# Patient Record
Sex: Male | Born: 1965
Health system: Southern US, Community
[De-identification: ages and names within clinical notes are randomized; demographics above are authoritative.]

## PROBLEM LIST (undated history)

## (undated) DIAGNOSIS — K859 Acute pancreatitis without necrosis or infection, unspecified: Secondary | ICD-10-CM

## (undated) DIAGNOSIS — E785 Hyperlipidemia, unspecified: Secondary | ICD-10-CM

## (undated) DIAGNOSIS — I10 Essential (primary) hypertension: Secondary | ICD-10-CM

## (undated) DIAGNOSIS — E119 Type 2 diabetes mellitus without complications: Secondary | ICD-10-CM

## (undated) HISTORY — PX: NO PAST SURGERIES: SHX2092

## (undated) HISTORY — PX: HERNIA REPAIR: SHX51

## (undated) HISTORY — DX: Hyperlipidemia, unspecified: E78.5

---

## 2014-05-10 ENCOUNTER — Emergency Department
Admit: 2014-05-10 | Disposition: A | Discharge status: Home / Self Care | Payer: Self-pay | Admitting: Emergency Medicine

## 2014-05-10 LAB — BODY FLUID CELL COUNT WITH DIFFERENTIAL
Basophil: 0 %
Eosinophil: 0 %
Lymphocytes: 1 %
Neutrophils: 81 %
Nucleated Cell Count: 11295 /mm3
Other Cells BF: 0 %
Other Mononuclear Cells: 18 %

## 2014-05-10 LAB — SYNOVIAL FLUID, CRYSTAL

## 2014-05-10 LAB — BASIC METABOLIC PANEL
Anion Gap: 11 (ref 7–16)
BUN: 13 mg/dL
Calcium, Total: 10.3 mg/dL
Chloride: 96 mmol/L — ABNORMAL LOW
Co2: 29 mmol/L
Creatinine: 0.76 mg/dL
EGFR (African American): >60
EGFR (Non-African Amer.): >60
Glucose: 145 mg/dL — ABNORMAL HIGH
Potassium: 3.8 mmol/L
Sodium: 136 mmol/L

## 2014-05-10 LAB — CBC WITH DIFFERENTIAL/PLATELET
Basophil #: 0.1 10*3/uL (ref 0.0–0.1)
Basophil %: 0.8 %
Eosinophil #: 0.3 10*3/uL (ref 0.0–0.7)
Eosinophil %: 1.5 %
HCT: 48.4 % (ref 40.0–52.0)
HGB: 16.3 g/dL (ref 13.0–18.0)
Lymphocyte #: 2.3 10*3/uL (ref 1.0–3.6)
Lymphocyte %: 12.7 %
MCH: 34 pg (ref 26.0–34.0)
MCHC: 33.6 g/dL (ref 32.0–36.0)
MCV: 101 fL — ABNORMAL HIGH (ref 80–100)
Monocyte #: 1.2 x10 3/mm — ABNORMAL HIGH (ref 0.2–1.0)
Monocyte %: 6.4 %
Neutrophil #: 14.4 10*3/uL — ABNORMAL HIGH (ref 1.4–6.5)
Neutrophil %: 78.6 %
Platelet: 284 10*3/uL (ref 150–440)
RBC: 4.78 10*6/uL (ref 4.40–5.90)
RDW: 12.9 % (ref 11.5–14.5)
WBC: 18.4 10*3/uL — ABNORMAL HIGH (ref 3.8–10.6)

## 2014-05-10 LAB — URIC ACID: Uric Acid: 10.2 mg/dL — ABNORMAL HIGH

## 2014-05-10 LAB — SEDIMENTATION RATE: Erythrocyte Sed Rate: 10 mm/hr (ref 0–15)

## 2014-05-14 LAB — BODY FLUID CULTURE

## 2015-07-26 ENCOUNTER — Emergency Department
Admission: EM | Admit: 2015-07-26 | Discharge: 2015-07-26 | Disposition: A | Discharge status: Home / Self Care | Payer: Self-pay | Attending: Emergency Medicine | Admitting: Emergency Medicine

## 2015-07-26 ENCOUNTER — Encounter: Payer: Self-pay | Admitting: Emergency Medicine

## 2015-07-26 DIAGNOSIS — E119 Type 2 diabetes mellitus without complications: Secondary | ICD-10-CM | POA: Insufficient documentation

## 2015-07-26 DIAGNOSIS — F17210 Nicotine dependence, cigarettes, uncomplicated: Secondary | ICD-10-CM | POA: Insufficient documentation

## 2015-07-26 DIAGNOSIS — H9201 Otalgia, right ear: Secondary | ICD-10-CM | POA: Insufficient documentation

## 2015-07-26 DIAGNOSIS — Z79899 Other long term (current) drug therapy: Secondary | ICD-10-CM | POA: Insufficient documentation

## 2015-07-26 DIAGNOSIS — Z7982 Long term (current) use of aspirin: Secondary | ICD-10-CM | POA: Insufficient documentation

## 2015-07-26 DIAGNOSIS — Z7984 Long term (current) use of oral hypoglycemic drugs: Secondary | ICD-10-CM | POA: Insufficient documentation

## 2015-07-26 DIAGNOSIS — R002 Palpitations: Secondary | ICD-10-CM | POA: Insufficient documentation

## 2015-07-26 DIAGNOSIS — R112 Nausea with vomiting, unspecified: Secondary | ICD-10-CM | POA: Insufficient documentation

## 2015-07-26 HISTORY — DX: Type 2 diabetes mellitus without complications: E11.9

## 2015-07-26 LAB — URINALYSIS COMPLETE WITH MICROSCOPIC (ARMC ONLY)
Bacteria, UA: NONE SEEN
Bilirubin Urine: NEGATIVE
Glucose, UA: NEGATIVE mg/dL
Hgb urine dipstick: NEGATIVE
Ketones, ur: NEGATIVE mg/dL
Leukocytes, UA: NEGATIVE
Nitrite: NEGATIVE
Protein, ur: NEGATIVE mg/dL
Specific Gravity, Urine: 1.019 (ref 1.005–1.030)
pH: 6 (ref 5.0–8.0)

## 2015-07-26 LAB — COMPREHENSIVE METABOLIC PANEL
ALT: 28 U/L (ref 17–63)
AST: 31 U/L (ref 15–41)
Albumin: 4.2 g/dL (ref 3.5–5.0)
Alkaline Phosphatase: 73 U/L (ref 38–126)
Anion gap: 8 (ref 5–15)
BUN: 14 mg/dL (ref 6–20)
CO2: 25 mmol/L (ref 22–32)
Calcium: 9.5 mg/dL (ref 8.9–10.3)
Chloride: 105 mmol/L (ref 101–111)
Creatinine, Ser: 0.82 mg/dL (ref 0.61–1.24)
GFR calc Af Amer: >60 mL/min (ref >60)
GFR calc non Af Amer: >60 mL/min (ref >60)
Glucose, Bld: 133 mg/dL — ABNORMAL HIGH (ref 65–99)
Potassium: 4.4 mmol/L (ref 3.5–5.1)
Sodium: 138 mmol/L (ref 135–145)
Total Bilirubin: 0.7 mg/dL (ref 0.3–1.2)
Total Protein: 7.2 g/dL (ref 6.5–8.1)

## 2015-07-26 LAB — BASIC METABOLIC PANEL
Anion gap: UNDETERMINED (ref 5–15)
BUN: 13 mg/dL (ref 6–20)
CO2: 25 mmol/L (ref 22–32)
Calcium: 9.6 mg/dL (ref 8.9–10.3)
Chloride: 104 mmol/L (ref 101–111)
Creatinine, Ser: 0.89 mg/dL (ref 0.61–1.24)
GFR calc Af Amer: >60 mL/min (ref >60)
GFR calc non Af Amer: >60 mL/min (ref >60)
Glucose, Bld: 159 mg/dL — ABNORMAL HIGH (ref 65–99)
Potassium: 4.3 mmol/L (ref 3.5–5.1)
Sodium: UNDETERMINED mmol/L (ref 135–145)

## 2015-07-26 LAB — CBC
HCT: 45.8 % (ref 40.0–52.0)
Hemoglobin: 15.8 g/dL (ref 13.0–18.0)
MCH: 34.6 pg — ABNORMAL HIGH (ref 26.0–34.0)
MCHC: 34.4 g/dL (ref 32.0–36.0)
MCV: 100.5 fL — ABNORMAL HIGH (ref 80.0–100.0)
Platelets: 207 10*3/uL (ref 150–440)
RBC: 4.55 MIL/uL (ref 4.40–5.90)
RDW: 13.7 % (ref 11.5–14.5)
WBC: 12.7 10*3/uL — ABNORMAL HIGH (ref 3.8–10.6)

## 2015-07-26 LAB — TROPONIN I: Troponin I: <0.03 ng/mL (ref <0.031)

## 2015-07-26 MED ORDER — CIPROFLOXACIN-DEXAMETHASONE 0.3-0.1 % OT SUSP: 4.0000 [drp] | Freq: Two times a day (BID) | OTIC | Status: DC

## 2015-07-26 MED ORDER — LIDOCAINE-EPINEPHRINE 2 %-1:100000 IJ SOLN
1.7000 mL | Freq: Once | INTRAMUSCULAR | Status: AC
  Administered 2015-07-26: 1.7 mL

## 2015-07-26 MED ORDER — SODIUM CHLORIDE 0.9 % IV BOLUS (SEPSIS)
1000.0000 mL | Freq: Once | INTRAVENOUS | Status: AC
  Administered 2015-07-26: 1000 mL via INTRAVENOUS

## 2015-07-26 MED ORDER — LIDOCAINE-EPINEPHRINE 2 %-1:100000 IJ SOLN
20.0000 mL | Freq: Once | INTRAMUSCULAR | Status: DC
  Filled 2015-07-26: qty 20

## 2015-07-26 MED ORDER — ONDANSETRON HCL 4 MG/2ML IJ SOLN
4.0000 mg | Freq: Once | INTRAMUSCULAR | Status: AC
  Administered 2015-07-26: 4 mg via INTRAVENOUS
  Filled 2015-07-26: qty 2

## 2015-07-26 MED ORDER — LIDOCAINE-EPINEPHRINE 2 %-1:100000 IJ SOLN
INTRAMUSCULAR | Status: AC
  Administered 2015-07-26: 1.7 mL
  Filled 2015-07-26: qty 1.7

## 2015-07-26 NOTE — ED Provider Notes (Signed)
CSN: [id number]     Arrival date & time 07/26/15  [id number] History   First MD Initiated Contact with Patient 07/26/15 [phone]     Chief Complaint  Patient presents with  . Foreign Body in [place]  . Dizziness  . Emesis  . Shortness of Breath     (Consider location/radiation/quality/duration/timing/severity/associated sxs/prior Treatment) The history is provided by the patient.  Riley Wiggins is a 50 y.o. male hx of DM, here with palpitations, vomiting, possible foreign body in the right ear. States that he felt a bug crawling in his right year for the last 3 days. He says he feels very anxious about it has some palpitations associated with it. Also felt like he was on pass out and this morning he vomited once but has been nauseated for several days. Denies any actual chest pain. Has some subjective shortness of breath that now is resolved. Denies recent travel was a history DVT or PE. Denies any hx of CAD.    Past Medical History  Diagnosis Date  . Diabetes mellitus without complication ([place])    History reviewed. No pertinent past surgical history. History reviewed. No pertinent family history. Social History  Substance Use Topics  . Smoking status: Current Every Day Smoker    Types: Cigarettes  . Smokeless tobacco: Never Used  . Alcohol Use: Yes    Review of Systems  HENT:       R ear pain   Gastrointestinal: Positive for vomiting.  Neurological: Positive for dizziness.  All other systems reviewed and are negative.     Allergies  Review of patient's allergies indicates no known allergies.  Home Medications   Prior to Admission medications   Not on File   BP 166/91 mmHg  Pulse 95  Temp(Src) 98.1 F (36.7 C) (Oral)  Resp 18  Ht 6\' 1"  (1.854 m)  Wt 293 lb (132.904 kg)  BMI 38.67 kg/m2  SpO2 96% Physical Exam  Constitutional: He is oriented to person, place, and time. He appears well-developed and well-nourished.  Anxious   HENT:  Head: Normocephalic.  Mouth/Throat:  Oropharynx is clear and moist.  No obvious foreign body in the ear canals. TM nl and no obvious otitis media or externa   Eyes: Conjunctivae are normal. Pupils are equal, round, and reactive to light.  Neck: Normal range of motion. Neck supple.  Cardiovascular: Normal rate, regular rhythm and normal heart sounds.   Pulmonary/Chest: Effort normal and breath sounds normal. No respiratory distress. He has no wheezes.  Abdominal: Soft. Bowel sounds are normal. He exhibits no distension. There is no tenderness. There is no rebound.  Musculoskeletal: Normal range of motion. He exhibits no edema or tenderness.  Neurological: He is alert and oriented to person, place, and time. No cranial nerve deficit. Coordination normal.  Skin: Skin is warm and dry.  Psychiatric: He has a normal mood and affect. His behavior is normal. Judgment and thought content normal.  Nursing note and vitals reviewed.   ED Course  Procedures (including critical care time) Labs Review Labs Reviewed  BASIC METABOLIC PANEL - Abnormal; Notable for the following:    Glucose, Bld 159 (*)    All other components within normal limits  CBC - Abnormal; Notable for the following:    WBC 12.7 (*)    MCV 100.5 (*)    MCH 34.6 (*)    All other components within normal limits  URINALYSIS COMPLETEWITH MICROSCOPIC ([facility] ONLY)  COMPREHENSIVE METABOLIC PANEL  CBG MONITORING, ED  Imaging Review No results found. I have personally reviewed and evaluated these images and lab results as part of my medical decision-making.   EKG Interpretation None       ED ECG REPORT I, Shakela Donati, the attending physician, personally viewed and interpreted this ECG.   Date: 07/26/2015  EKG Time: 6:16 am  Rate: 86  Rhythm: normal EKG, normal sinus rhythm, nonspecific ST and T waves changes  Axis: normal  Intervals:none  ST&T Change: nonspecific    MDM   Final diagnoses:  None   Riley Wiggins is a 50 y.o. male here with R ear  possible foreign body sensation and palpitations. I don't see any obvious foreign body or bugs or otitis media or externa on exam. Likely anxious about his symptoms. I doubt ACS or PE. Will get labs and hydrate and put lido in R ear for comfort.   9:18 AM Labs unremarkable. Chemistry unremarkable. Felt better. Tolerated PO. Re examine the right ear after lidocaine. I still don't see any foreign body or any wax. He still feels something there. I would prescribe auralgan normally but its off the market. Can try ciprodex for comfort and may help with some inflammation    Wandra Arthurs, MD 07/26/15 [phone]

## 2015-07-26 NOTE — Discharge Instructions (Signed)
Stay hydrated.   Take ciprodex drops to right ear twice daily for comfort.   See your doctor   Return to ER if you have severe ear pain, chest pain, palpitations, shortness of breath

## 2015-07-26 NOTE — ED Notes (Signed)
Pt unable to give urine sample at this time; pt has specimen cup. 

## 2015-07-26 NOTE — ED Notes (Addendum)
Pt presents to ED with possible bug in his ear. Pt states he works outside and on Saturday felt like a bug was crawling inside his ear. Pt reports this morning around 4am he started vomiting and felt like he was "going to pass out". Pt states he fells like he is short of breath and like his heart is racing. Pt states when he work up and felt like he was in a cold sweat. Pt alert and calm at this time with no increased work of breathing or acute distress noted. Skin warm and dry.

## 2019-08-03 ENCOUNTER — Encounter (HOSPITAL_COMMUNITY): Payer: Self-pay | Admitting: Emergency Medicine

## 2019-08-03 ENCOUNTER — Emergency Department (HOSPITAL_COMMUNITY): Payer: PRIVATE HEALTH INSURANCE

## 2019-08-03 ENCOUNTER — Inpatient Hospital Stay (HOSPITAL_COMMUNITY)
Admission: EM | Admit: 2019-08-03 | Discharge: 2019-08-06 | DRG: 438 | Disposition: A | Discharge status: Home / Self Care | Payer: PRIVATE HEALTH INSURANCE | Attending: Family Medicine | Admitting: Family Medicine

## 2019-08-03 ENCOUNTER — Other Ambulatory Visit: Payer: Self-pay

## 2019-08-03 ENCOUNTER — Inpatient Hospital Stay (HOSPITAL_COMMUNITY): Payer: PRIVATE HEALTH INSURANCE

## 2019-08-03 DIAGNOSIS — Z20822 Contact with and (suspected) exposure to covid-19: Secondary | ICD-10-CM | POA: Diagnosis present

## 2019-08-03 DIAGNOSIS — E8729 Other acidosis: Secondary | ICD-10-CM

## 2019-08-03 DIAGNOSIS — Z833 Family history of diabetes mellitus: Secondary | ICD-10-CM

## 2019-08-03 DIAGNOSIS — I700 Atherosclerosis of aorta: Secondary | ICD-10-CM | POA: Diagnosis present

## 2019-08-03 DIAGNOSIS — I152 Hypertension secondary to endocrine disorders: Secondary | ICD-10-CM | POA: Diagnosis present

## 2019-08-03 DIAGNOSIS — E785 Hyperlipidemia, unspecified: Secondary | ICD-10-CM

## 2019-08-03 DIAGNOSIS — K859 Acute pancreatitis without necrosis or infection, unspecified: Principal | ICD-10-CM | POA: Diagnosis present

## 2019-08-03 DIAGNOSIS — K831 Obstruction of bile duct: Secondary | ICD-10-CM

## 2019-08-03 DIAGNOSIS — Z9119 Patient's noncompliance with other medical treatment and regimen: Secondary | ICD-10-CM

## 2019-08-03 DIAGNOSIS — E1169 Type 2 diabetes mellitus with other specified complication: Secondary | ICD-10-CM | POA: Diagnosis present

## 2019-08-03 DIAGNOSIS — E1159 Type 2 diabetes mellitus with other circulatory complications: Secondary | ICD-10-CM | POA: Diagnosis present

## 2019-08-03 DIAGNOSIS — M109 Gout, unspecified: Secondary | ICD-10-CM | POA: Diagnosis present

## 2019-08-03 DIAGNOSIS — I10 Essential (primary) hypertension: Secondary | ICD-10-CM | POA: Diagnosis present

## 2019-08-03 DIAGNOSIS — Z79899 Other long term (current) drug therapy: Secondary | ICD-10-CM

## 2019-08-03 DIAGNOSIS — Z7982 Long term (current) use of aspirin: Secondary | ICD-10-CM

## 2019-08-03 DIAGNOSIS — Z7984 Long term (current) use of oral hypoglycemic drugs: Secondary | ICD-10-CM

## 2019-08-03 DIAGNOSIS — F17210 Nicotine dependence, cigarettes, uncomplicated: Secondary | ICD-10-CM | POA: Diagnosis present

## 2019-08-03 DIAGNOSIS — R6510 Systemic inflammatory response syndrome (SIRS) of non-infectious origin without acute organ dysfunction: Secondary | ICD-10-CM | POA: Diagnosis present

## 2019-08-03 DIAGNOSIS — E1110 Type 2 diabetes mellitus with ketoacidosis without coma: Secondary | ICD-10-CM | POA: Diagnosis present

## 2019-08-03 DIAGNOSIS — E781 Pure hyperglyceridemia: Secondary | ICD-10-CM | POA: Diagnosis present

## 2019-08-03 DIAGNOSIS — K8500 Idiopathic acute pancreatitis without necrosis or infection: Secondary | ICD-10-CM | POA: Diagnosis present

## 2019-08-03 DIAGNOSIS — E872 Acidosis: Secondary | ICD-10-CM

## 2019-08-03 HISTORY — DX: Hyperlipidemia, unspecified: E78.5

## 2019-08-03 HISTORY — DX: Essential (primary) hypertension: I10

## 2019-08-03 LAB — I-STAT VENOUS BLOOD GAS, ED
Acid-base deficit: 1 mmol/L (ref 0.0–2.0)
Bicarbonate: 23.5 mmol/L (ref 20.0–28.0)
Calcium, Ion: 1.03 mmol/L — ABNORMAL LOW (ref 1.15–1.40)
HCT: 51 % (ref 39.0–52.0)
Hemoglobin: 17.3 g/dL — ABNORMAL HIGH (ref 13.0–17.0)
O2 Saturation: 100 %
Potassium: 4.6 mmol/L (ref 3.5–5.1)
Sodium: 131 mmol/L — ABNORMAL LOW (ref 135–145)
TCO2: 25 mmol/L (ref 22–32)
pCO2, Ven: 37.5 mmHg — ABNORMAL LOW (ref 44.0–60.0)
pH, Ven: 7.406 (ref 7.250–7.430)
pO2, Ven: 189 mmHg — ABNORMAL HIGH (ref 32.0–45.0)

## 2019-08-03 LAB — CBC
HCT: 46.2 % (ref 39.0–52.0)
Hemoglobin: 15.8 g/dL (ref 13.0–17.0)
MCH: 34 pg (ref 26.0–34.0)
MCHC: 34.2 g/dL (ref 30.0–36.0)
MCV: 99.4 fL (ref 80.0–100.0)
Platelets: 186 10*3/uL (ref 150–400)
RBC: 4.65 MIL/uL (ref 4.22–5.81)
RDW: 12.9 % (ref 11.5–15.5)
WBC: 16.8 10*3/uL — ABNORMAL HIGH (ref 4.0–10.5)
nRBC: 0 % (ref 0.0–0.2)

## 2019-08-03 LAB — LIPASE, BLOOD: Lipase: 2307 U/L — ABNORMAL HIGH (ref 11–51)

## 2019-08-03 LAB — COMPREHENSIVE METABOLIC PANEL
ALT: 106 U/L — ABNORMAL HIGH (ref 0–44)
AST: 59 U/L — ABNORMAL HIGH (ref 15–41)
Albumin: 4.3 g/dL (ref 3.5–5.0)
Alkaline Phosphatase: 87 U/L (ref 38–126)
Anion gap: 18 — ABNORMAL HIGH (ref 5–15)
BUN: 12 mg/dL (ref 6–20)
CO2: 18 mmol/L — ABNORMAL LOW (ref 22–32)
Calcium: 9.7 mg/dL (ref 8.9–10.3)
Chloride: 97 mmol/L — ABNORMAL LOW (ref 98–111)
Creatinine, Ser: 1.16 mg/dL (ref 0.61–1.24)
GFR calc Af Amer: >60 mL/min (ref >60)
GFR calc non Af Amer: >60 mL/min (ref >60)
Glucose, Bld: 362 mg/dL — ABNORMAL HIGH (ref 70–99)
Potassium: 4.5 mmol/L (ref 3.5–5.1)
Sodium: 133 mmol/L — ABNORMAL LOW (ref 135–145)
Total Bilirubin: 2.1 mg/dL — ABNORMAL HIGH (ref 0.3–1.2)
Total Protein: 7.1 g/dL (ref 6.5–8.1)

## 2019-08-03 LAB — LIPID PANEL
Cholesterol: 189 mg/dL (ref 0–200)
HDL: 43 mg/dL (ref >40)
LDL Cholesterol: UNDETERMINED mg/dL (ref 0–99)
Total CHOL/HDL Ratio: 4.4 RATIO
Triglycerides: 554 mg/dL — ABNORMAL HIGH (ref <150)
VLDL: UNDETERMINED mg/dL (ref 0–40)

## 2019-08-03 LAB — URINALYSIS, ROUTINE W REFLEX MICROSCOPIC
Bacteria, UA: NONE SEEN
Bilirubin Urine: NEGATIVE
Glucose, UA: >=500 mg/dL — AB
Ketones, ur: 20 mg/dL — AB
Leukocytes,Ua: NEGATIVE
Nitrite: NEGATIVE
Protein, ur: 30 mg/dL — AB
Specific Gravity, Urine: >1.046 — ABNORMAL HIGH (ref 1.005–1.030)
pH: 5 (ref 5.0–8.0)

## 2019-08-03 LAB — BETA-HYDROXYBUTYRIC ACID: Beta-Hydroxybutyric Acid: 1.16 mmol/L — ABNORMAL HIGH (ref 0.05–0.27)

## 2019-08-03 LAB — ETHANOL: Alcohol, Ethyl (B): <10 mg/dL (ref <10)

## 2019-08-03 LAB — LACTIC ACID, PLASMA: Lactic Acid, Venous: 3.3 mmol/L (ref 0.5–1.9)

## 2019-08-03 LAB — ACETAMINOPHEN LEVEL: Acetaminophen (Tylenol), Serum: <10 ug/mL — ABNORMAL LOW (ref 10–30)

## 2019-08-03 LAB — LDL CHOLESTEROL, DIRECT: Direct LDL: 57 mg/dL (ref 0–99)

## 2019-08-03 LAB — SARS CORONAVIRUS 2 BY RT PCR (HOSPITAL ORDER, PERFORMED IN ~~LOC~~ HOSPITAL LAB): SARS Coronavirus 2: NEGATIVE

## 2019-08-03 MED ORDER — IOHEXOL 300 MG/ML  SOLN
100.0000 mL | Freq: Once | INTRAMUSCULAR | Status: AC | PRN
  Administered 2019-08-03: 100 mL via INTRAVENOUS

## 2019-08-03 MED ORDER — INSULIN REGULAR(HUMAN) IN NACL 100-0.9 UT/100ML-% IV SOLN
INTRAVENOUS | Status: DC
  Administered 2019-08-04: 4.6 [IU]/h via INTRAVENOUS
  Administered 2019-08-04: 18 [IU]/h via INTRAVENOUS
  Filled 2019-08-03 (×2): qty 100

## 2019-08-03 MED ORDER — ENOXAPARIN SODIUM 80 MG/0.8ML ~~LOC~~ SOLN
65.0000 mg | SUBCUTANEOUS | Status: DC
  Filled 2019-08-03: qty 0.65

## 2019-08-03 MED ORDER — POTASSIUM CHLORIDE 10 MEQ/100ML IV SOLN
10.0000 meq | INTRAVENOUS | Status: AC
  Administered 2019-08-03 (×2): 10 meq via INTRAVENOUS
  Filled 2019-08-03 (×2): qty 100

## 2019-08-03 MED ORDER — MORPHINE SULFATE (PF) 4 MG/ML IV SOLN: 6.0000 mg | Freq: Once | INTRAVENOUS | Status: DC

## 2019-08-03 MED ORDER — SODIUM CHLORIDE 0.9 % IV SOLN: INTRAVENOUS | Status: DC

## 2019-08-03 MED ORDER — SODIUM CHLORIDE 0.9 % IV BOLUS
500.0000 mL | Freq: Once | INTRAVENOUS | Status: AC
  Administered 2019-08-03: 500 mL via INTRAVENOUS

## 2019-08-03 MED ORDER — SODIUM CHLORIDE 0.9 % IV SOLN: 2.0000 g | Freq: Once | INTRAVENOUS | Status: DC

## 2019-08-03 MED ORDER — POTASSIUM CHLORIDE 10 MEQ/100ML IV SOLN: 10.0000 meq | INTRAVENOUS | Status: DC

## 2019-08-03 MED ORDER — DEXTROSE 50 % IV SOLN
0.0000 mL | INTRAVENOUS | Status: DC | PRN
Start: 2019-08-03 — End: 2019-08-03

## 2019-08-03 MED ORDER — INSULIN REGULAR(HUMAN) IN NACL 100-0.9 UT/100ML-% IV SOLN: INTRAVENOUS | Status: DC

## 2019-08-03 MED ORDER — SODIUM CHLORIDE 0.9% FLUSH
3.0000 mL | Freq: Once | INTRAVENOUS | Status: AC
  Administered 2019-08-03: 3 mL via INTRAVENOUS

## 2019-08-03 MED ORDER — MORPHINE SULFATE (PF) 2 MG/ML IV SOLN
2.0000 mg | INTRAVENOUS | Status: DC | PRN
  Administered 2019-08-04 – 2019-08-06 (×7): 2 mg via INTRAVENOUS
  Filled 2019-08-03 (×7): qty 1

## 2019-08-03 MED ORDER — DEXTROSE-NACL 5-0.45 % IV SOLN: INTRAVENOUS | Status: DC

## 2019-08-03 MED ORDER — METRONIDAZOLE IN NACL 5-0.79 MG/ML-% IV SOLN: 500.0000 mg | Freq: Once | INTRAVENOUS | Status: DC

## 2019-08-03 MED ORDER — MORPHINE SULFATE (PF) 4 MG/ML IV SOLN
4.0000 mg | Freq: Once | INTRAVENOUS | Status: AC
  Administered 2019-08-03: 4 mg via INTRAVENOUS
  Filled 2019-08-03: qty 1

## 2019-08-03 MED ORDER — DEXTROSE 50 % IV SOLN: 0.0000 mL | INTRAVENOUS | Status: DC | PRN

## 2019-08-03 MED ORDER — ONDANSETRON HCL 4 MG/2ML IJ SOLN
4.0000 mg | Freq: Once | INTRAMUSCULAR | Status: AC
  Administered 2019-08-03: 4 mg via INTRAVENOUS
  Filled 2019-08-03: qty 2

## 2019-08-03 NOTE — H&P (Addendum)
[facility] Admission History and Physical Service Pager: [phone]  Patient name: Riley Wiggins Medical record number: [id number] Date of birth: July 16, 1965 Age: 54 y.o. Gender: male  Primary Care Provider: Riki Rusk, PA Consultants: None Code Status: Full  Preferred Emergency Contact: Christianne Dolin, [place], [phone]  Chief Complaint: Abdominal pain  Assessment and Plan: Riley Wiggins is a 54 y.o. male presenting with abdominal pain found to have acute pancreatitis. PMH is significant for gout, hyperlipidemia, type 2 diabetes, hypertension  Abdominal Pain likely Secondary to Acute Pancreatitis Patient presented with 2 days of constant abdominal pain located in the central abdomen/epigastric region which has worsened over the past 2 days and does not radiate anywhere.  Patient denies this being related to activity or movement.  Initial lipase in the emergency department elevated at 2307.  Patient also noted to have a mildly elevated AST of 59, ALT of 106.  Patient with bilirubin mildly elevated at 2.1. Patient was also noted to have an elevated white blood cell count of 16.8.  CT scan performed in the emergency department shows acute pancreatitis, moderate in severity with heterogenous decreased enhancement within the pancreatic head compatible with inflammatory changes without signs of abscess or pseudocyst.  CT abdomen also showed diffuse hepatic steatosis and small volume ascites. Cholesterol panel collected in the emergency department shows cholesterol 189, HDL 43, triglycerides of 554 and LDL unable to calculate due to elevated triglycerides.  In the emergency department ordered, but did not receive ceftriaxone and Flagyl. Overall etiology for abdominal pain in the setting of acute pancreatitis can include alcohol, gallstone pancreatitis, hypertriglyceridemia, less likely due to recent procedures no documented history of ERCP. 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  No gallstones seen on CT, however right upper quadrant ultrasound was performed as patient did have mildly elevated AST/ALT and mildly elevated bilirubin of 2.1 which could be suggestive of a gallstone.  Patient does have a history of hypertriglyceridemia with previous lipid panel showing triglycerides as high as the 800s in 2015 with a more recent one in 2018 showing triglycerides in the 400s, TGs today 554, therefore less likely to cause acute pancreatitis as is <1000.  Patient also is a former tobacco smoker and quit 2 or 3 years ago.  Low suspicion for ACS and EKG with normal sinus rhythm though this could still occur in the setting of acute pancreatitis so we will check a troponin.  Would not continue antibiotics at this time given no fever, no evidence of extrapancreatic infection, and no evidence of necrotizing pancreatitis on CT abd/pelvis.  VSS and no evidence of organ dysfunction.   S/P 500cc NS bolus in ED, will treat as NPO with fluids per DKA protocol.  QTc is mildly prolonged at 462, therefore would be conservative with antiemetic and will order one time dosing if requests.  -Admit to progressive, attending Dr. Owens Shark -A.m. CMP -We will do right upper quadrant ultrasound to look for signs of gallstones -We will check a troponin but low suspicion for ACS -We will discontinue Flagyl and ceftriaxone -Morphine 4 mg in the ED for pain -Continuous cardiac monitoring since using opioids. -We will trend lactate level - limit antiemetic use with mildly prolonged QTc - continuous pulse ox while receiving opioids as is naive  DKA Patient's initial blood glucose elevated at 362 in the emergency department with a bicarb decreased at 18,  anion gap increased at 18, beta hydroxybutyrate was drawn which was elevated at 1.16.  Most recent potassium 4.5.  VBG significant for normal pH 7.4, PCO2 37.5, bicarb  calculated 23.5.  Urine with glucose >500, 20 ketones.  Would meet criteria for mild DKA.  Patient does endorse he took his Metformin as usual this morning, he states he normally takes 1000 mg in the morning. -Insulin drip -IVF normal saline at 75 mL/h -Every 4hr BMP -Endo tool protocol - monitor K while on insulin gtt -Urinalysis -N.p.o. until transition -Vitals per floor -DM educator -A1C  Patient meeting SIRS criteria without a source In the emergency department patient's lactate level was elevated at 3.3.  Patient had an elevated white blood cell count of 16.8.  Patient did have a respiratory rate at 1 time in the low 30s per chart review and was tachycardic during 1 vitals check.  Patient was started on Flagyl and ceftriaxone in the emergency department patient meets SIRS criteria but without a known source.  Patient's elevated lactate, WBC level most likely reactive in the setting of his acute pancreatitis and DKA.  No fevers also supports this. -Discontinue metronidazole and ceftriaxone -We will monitor for fevers -If patient develops symptoms concerning for infectious process will initiate antibiotic coverage as appropriate.  Hyperlipidemia with hypertriglyceridemia Home medications include atorvastatin 40 mg/day.  Most recent lipid profile from 2018 shows triglycerides elevated at 410, HDL 43, unable to calculate LDL.  Chart review shows patient has had triglycerides as high as 839 in 2015.  Current lipid panel drawn in the emergency department shows triglycerides of 554, HDL 43, unable to calculate LDL due to elevated triglycerides. -We will hold statin pending n.p.o. status and mildly elevated LFTs.  Hypertension Home medication include lisinopril 20 mg/day.  Blood pressures in the emergency department ranged from 122-140/68-97 with most recent blood pressure 140/75. -We will hold home lisinopril since patient received CT earlier -Can consider restarting once no longer  n.p.o.  Type 2 diabetes Home medications include Metformin 500 mg twice daily, but takes it 1000mg  QD.  Blood glucose level on admission 362. -We will hold Metformin during the hospital setting -Treating for mild DKA as above  History of tobacco abuse Patient with a previous smoking history of 1/2 pack/day for 20 years.  Endorses quitting 2 or 3 years ago.   FEN/GI: N.p.o. Prophylaxis: Lovenox  Disposition: Admitted to progressive  History of Present Illness:  Riley Wiggins is a 54 y.o. male presenting with abdominal pain found to have acute pancreatitis patient.  Patient states he is an occasional weekend drinker.  Initial lipase elevated at 2307.  CT scan shows pancreatitis without obvious pseudocyst or abscess with gallbladder appearing normal.  Patient did have an elevated glucose of 362 with a CO2 elevated at 18 and anion gap at 18.  Beta hydroxybutyrate was elevated as well.  His abdominal pain started yesterday, all of a sudden.  He states that he stomach was getting more and more sore, continued to worsen.  Pain is in the center of the upper abdomen and does not radiate.  Today, he drove his truck as usual for work, but when he came home, he told his wife that his pain was so severe that he wanted to go to the hospital.  He has never had anything like this before.  Endorses nausea, no vomiting.  States that he has not eaten in 2 days because of the pain.  Every now and then on the weekend,  he states that he has one beer.    Patient reports that he took all of his medications in the morning and took them this morning.  His sugars are usually well-controlled by his report.  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  Takes an aspirin for primary prevention.  He drinks 3-4 12 oz beers on the weekend.  He follows with [facility].    Review Of Systems: Per HPI with the following additions:   Review of Systems  Constitutional: Negative for chills and  fever.  HENT: Negative for congestion, rhinorrhea and sore throat.   Respiratory: Negative for shortness of breath.   Cardiovascular: Negative for chest pain and leg swelling.  Gastrointestinal: Positive for abdominal pain and nausea. Negative for blood in stool, constipation, diarrhea and vomiting.  Genitourinary: Negative for dysuria.  Neurological: Negative for headaches.     There are no problems to display for this patient.   Past Medical History: Past Medical History:  Diagnosis Date  . Diabetes mellitus without complication [facility])     Past Surgical History: History reviewed. No pertinent surgical history.  Social History: Social History   Tobacco Use  . Smoking status: Current Every Day Smoker    Types: Cigarettes  . Smokeless tobacco: Never Used  Substance Use Topics  . Alcohol use: Yes  . Drug use: No   Additional social history: Smoked 1/2 ppd, quit 5 years ago, smoked 20 yrs, no vaping or other products, drinks 3-4 12 oz beers on the , denies illicit drug use Please also refer to relevant sections of EMR.  Family History: No family history on file. Mother and Father Diabetes Sister, father, and Mother High Cholesterol No family history of pancreatitis Mother, Father, 2 sisters with heart disease  Allergies and Medications: No Known Allergies No current facility-administered medications on file prior to encounter.   Current Outpatient Medications on File Prior to Encounter  Medication Sig Dispense Refill  . allopurinol (ZYLOPRIM) 100 MG tablet Take 200 mg by mouth daily.    Marland Kitchen aspirin 81 MG chewable tablet Chew 81 mg by mouth daily.    Marland Kitchen atorvastatin (LIPITOR) 40 MG tablet Take 1 tablet by mouth at bedtime.    Marland Kitchen lisinopril (PRINIVIL,ZESTRIL) 20 MG tablet Take 20 mg by mouth daily.    . metFORMIN (GLUCOPHAGE) 500 MG tablet Take 500 mg by mouth 2 (two) times daily with a meal.    . ciprofloxacin-dexamethasone (CIPRODEX) otic suspension Place 4 drops into the  right ear 2 (two) times daily. (Patient not taking: Reported on 08/03/2019) 7.5 mL 0    Objective: BP 122/87   Pulse 97   Temp 97.8 F (36.6 C) (Oral)   Resp (!) 23   SpO2 98%  Exam: General: Alert and oriented, no apparent distress  Eyes: PERRL ENTM: No pharyengeal erythema, mouth appears dry Cardiovascular: RRR with no murmurs noted Respiratory: CTA bilaterally  Gastrointestinal: Bowel sounds present.  Abdominal pain present in the epigastric region to palpation without acute surgical findings.  Patient does seem to have some mild right upper quadrant discomfort on palpation but with negative Murphy sign Derm: No rashes noted, patient with small 1 cm diameter abrasion to the right lower shin without any signs of erythema or drainage Neuro: No focal deficits Psych: Behavior and speech appropriate to situation  Labs and Imaging: CBC BMET  Recent Labs  Lab 08/03/19 1518 08/03/19 1518 08/03/19 2037  WBC 16.8*  --   --  HGB 15.8   < > 17.3*  HCT 46.2   < > 51.0  PLT 186  --   --    < > = values in this interval not displayed.   Recent Labs  Lab 08/03/19 1518 08/03/19 1518 08/03/19 2037  NA 133*   < > 131*  K 4.5   < > 4.6  CL 97*  --   --   CO2 18*  --   --   BUN 12  --   --   CREATININE 1.16  --   --   GLUCOSE 362*  --   --   CALCIUM 9.7  --   --    < > = values in this interval not displayed.      Lurline Del, DO 08/03/2019, 9:35 PM PGY-1, [facility] pager: [phone], text pages welcome  FPTS Upper-Level Resident Addendum   I have independently interviewed and examined the patient. I have discussed the above with the original author and agree with their documentation. My edits for correction/addition/clarification are in green. Please see also any attending notes.   [place] Constable, D.O. PGY-2, [facility] 08/03/2019 10:57 PM  FPTS Service pager: [phone] (text pages welcome through [place])

## 2019-08-03 NOTE — ED Notes (Signed)
Pt transported to Ultrasound.  

## 2019-08-03 NOTE — ED Triage Notes (Signed)
Pt reports generalized abd pain with nausea, denies diarrhea or constipation.

## 2019-08-03 NOTE — ED Provider Notes (Signed)
Emergency Department Provider Note   I have reviewed the triage vital signs and the nursing notes.   HISTORY  Chief Complaint Abdominal Pain   HPI Riley Wiggins is a 54 y.o. male with past history of diabetes presents to the emergency department with acute onset abdominal pain with nausea and poor appetite.  Symptoms worsened significantly yesterday.  Patient tried to go to work today but ultimately had to leave early and come to the emergency department.  He has not had fevers, chills, body aches.  Denies diarrhea or constipation.  No blood in the emesis.  No prior history of pancreatitis.  Patient drinks occasionally on the weekends.  No recent drinking binges.  Denies any new medications.  The patient's wife at bedside states they recently moved to the area and so been doing work at the house but no obvious injury.  Symptoms not significantly worse with eating but patient has felt so bad he has not been attempting to eat very much.     Past Medical History:  Diagnosis Date  . Diabetes mellitus without complication [facility])     Patient Active Problem List   Diagnosis Date Noted  . Pancreatitis 08/03/2019    History reviewed. No pertinent surgical history.  Allergies Patient has no known allergies.  No family history on file.  Social History Social History   Tobacco Use  . Smoking status: Current Every Day Smoker    Types: Cigarettes  . Smokeless tobacco: Never Used  Substance Use Topics  . Alcohol use: Yes  . Drug use: No    Review of Systems  Constitutional: No fever/chills Eyes: No visual changes. ENT: No sore throat. Cardiovascular: Denies chest pain. Respiratory: Denies shortness of breath. Gastrointestinal: Positive epigastric and LUQ abdominal pain. Positive nausea and vomiting.  No diarrhea.  No constipation. Genitourinary: Negative for dysuria. Musculoskeletal: Negative for back pain. Skin: Negative for rash. Neurological: Negative for headaches,  focal weakness or numbness.  10-point ROS otherwise negative.  ____________________________________________   PHYSICAL EXAM:  VITAL SIGNS: ED Triage Vitals [08/03/19 1511]  Enc Vitals Group     BP 133/76     Pulse Rate 91     Resp 20     Temp 97.8 F (36.6 C)     Temp Source Oral     SpO2 97 %   Constitutional: Alert and oriented. Well appearing and in no acute distress. Eyes: Conjunctivae are normal.  Head: Atraumatic. Nose: No congestion/rhinnorhea. Mouth/Throat: Mucous membranes are moist.  Neck: No stridor.   Cardiovascular: Normal rate, regular rhythm. Good peripheral circulation. Grossly normal heart sounds.   Respiratory: Normal respiratory effort.  No retractions. Lungs CTAB. Gastrointestinal: Soft with focal epigastric and LUQ tenderness. No rebound or guarding. Only mild tenderness in the RUQ. Positive distention.  Musculoskeletal: No gross deformities of extremities. Neurologic:  Normal speech and language. Skin:  Skin is warm, dry and intact. No rash noted.  ____________________________________________   LABS (all labs ordered are listed, but only abnormal results are displayed)  Labs Reviewed  LIPASE, BLOOD - Abnormal; Notable for the following components:      Result Value   Lipase 2,307 (*)    All other components within normal limits  COMPREHENSIVE METABOLIC PANEL - Abnormal; Notable for the following components:   Sodium 133 (*)    Chloride 97 (*)    CO2 18 (*)    Glucose, Bld 362 (*)    AST 59 (*)    ALT 106 (*)  Total Bilirubin 2.1 (*)    Anion gap 18 (*)    All other components within normal limits  CBC - Abnormal; Notable for the following components:   WBC 16.8 (*)    All other components within normal limits  ACETAMINOPHEN LEVEL - Abnormal; Notable for the following components:   Acetaminophen (Tylenol), Serum <10 (*)    All other components within normal limits  BETA-HYDROXYBUTYRIC ACID - Abnormal; Notable for the following  components:   Beta-Hydroxybutyric Acid 1.16 (*)    All other components within normal limits  LACTIC ACID, PLASMA - Abnormal; Notable for the following components:   Lactic Acid, Venous 3.3 (*)    All other components within normal limits  I-STAT VENOUS BLOOD GAS, ED - Abnormal; Notable for the following components:   pCO2, Ven 37.5 (*)    pO2, Ven 189.0 (*)    Sodium 131 (*)    Calcium, Ion 1.03 (*)    Hemoglobin 17.3 (*)    All other components within normal limits  SARS CORONAVIRUS 2 BY RT PCR (HOSPITAL ORDER, [facility])  ETHANOL  URINALYSIS, ROUTINE W REFLEX MICROSCOPIC  LIPID PANEL  LACTIC ACID, PLASMA  CBG MONITORING, ED   ____________________________________________  RADIOLOGY  CT ABDOMEN PELVIS W CONTRAST  Result Date: 08/03/2019 CLINICAL DATA:  Left upper quadrant pain, pancreatitis, nausea EXAM: CT ABDOMEN AND PELVIS WITH CONTRAST TECHNIQUE: Multidetector CT imaging of the abdomen and pelvis was performed using the standard protocol following bolus administration of intravenous contrast. CONTRAST:  129mL OMNIPAQUE IOHEXOL 300 MG/ML  SOLN COMPARISON:  None. FINDINGS: Lower chest: No acute pleural or parenchymal lung disease. Hepatobiliary: There is diffuse hepatic steatosis without focal liver abnormality. Gallbladder is unremarkable. Pancreas: There is diffuse peripancreatic fat stranding with small volume ascites consistent with acute pancreatitis. Heterogeneous decreased enhancement within the head of the pancreas is noted, without well-defined fluid collection, abscess, or pseudocyst. Spleen: Normal in size without focal abnormality. Adrenals/Urinary Tract: 5 mm nonobstructing calculus is seen within the mid left kidney. Right kidney is unremarkable. No obstructive uropathy. The adrenals and bladder are normal. Stomach/Bowel: Scattered diverticulosis throughout the colon without diverticulitis. No bowel obstruction or ileus. Normal appendix right  lower quadrant. Vascular/Lymphatic: Branches. The splenic vein there is mild atherosclerosis of the aorta and its distal, superior mesenteric vein, and portal vein remain patent. No pathologic adenopathy. Reproductive: Prostate is unremarkable. Other: Extensive peripancreatic fat stranding with small amount of free fluid is seen throughout the upper abdomen compatible with pancreatitis. No fluid collection or abscess. No free intraperitoneal gas. No abdominal wall hernia. Musculoskeletal: No acute or destructive bony lesions. Reconstructed images demonstrate no additional findings. IMPRESSION: 1. Acute pancreatitis, moderate in severity. Heterogeneous decreased enhancement within the pancreatic head compatible with inflammatory changes, with no organized fluid collection, abscess, or pseudocyst at this time. 2. Diffuse hepatic steatosis. 3. Small volume ascites. 4. Diverticulosis without diverticulitis. 5.  Aortic Atherosclerosis (ICD10-I70.0). Electronically Signed   By: Randa Ngo M.D.   On: 08/03/2019 21:12    ____________________________________________   PROCEDURES  Procedure(s) performed:   Procedures  CRITICAL CARE Performed by: Margette Fast Total critical care time: 35 minutes Critical care time was exclusive of separately billable procedures and treating other patients. Critical care was necessary to treat or prevent imminent or life-threatening deterioration. Critical care was time spent personally by me on the following activities: development of treatment plan with patient and/or surrogate as well as nursing, discussions with consultants, evaluation of patient's  response to treatment, examination of patient, obtaining history from patient or surrogate, ordering and performing treatments and interventions, ordering and review of laboratory studies, ordering and review of radiographic studies, pulse oximetry and re-evaluation of patient's condition.  Nanda Quinton, MD Emergency  Medicine  ____________________________________________   INITIAL IMPRESSION / ASSESSMENT AND PLAN / ED COURSE  Pertinent labs & imaging results that were available during my care of the patient were reviewed by me and considered in my medical decision making (see chart for details).   Patient presents to the emergency department for evaluation of epigastric and left upper quadrant abdominal pain with vomiting.  Labs from triage reviewed showing lipase of 2300 with mild elevation in LFTs and bilirubin of 2.1.  Patient does have a leukocytosis but afebrile and no vital signs or other history to suspect acute infection or developing sepsis.  Patient's chemistry also shows hyperglycemia with CO2 of 18 and anion gap of 18 as well.  Have added VBG and beta hydroxybutyrate.  Will follow CT abdomen pelvis.  Differential includes alcohol versus gallstone pancreatitis.  No antibiotics for now with no clinical findings to suspect cholangitis. Will send lipid panel as well.    EKG Interpretation  Date/Time:  Monday August 03 2019 22:09:31 EDT Ventricular Rate:  96 PR Interval:    QRS Duration: 85 QT Interval:  365 QTC Calculation: 462 R Axis:   68 Text Interpretation: Sinus rhythm No STEMI Confirmed by Nanda Quinton [phone]) on 08/03/2019 10:12:06 PM      Labs reviewed showing developing DKA with elevated beta hydroxybutyric acid.  CT imaging reviewed with no acute biliary pathology.  I will add on an ultrasound to facilitate inpatient work-up.  No antibiotics for now.  We will start insulin infusion along with IV fluids which have been ordered previously.  EKG interpreted as above by me with no acute findings.  Updated patient and wife at bedside regarding CT imaging and plan for admission and they are in agreement.  Have ordered additional pain medicines as well.   Discussed patient's case with Medicine to request admission. Patient and family (if present) updated with plan. Care transferred to Medicine  service.  I reviewed all nursing notes, vitals, pertinent old records, EKGs, labs, imaging (as available).  10:14 PM  Lactate has come back elevated. Will add abx pending cultures.  ____________________________________________  FINAL CLINICAL IMPRESSION(S) / ED DIAGNOSES  Final diagnoses:  Acute pancreatitis, unspecified complication status, unspecified pancreatitis type  Pancreatitis due to biliary obstruction     MEDICATIONS GIVEN DURING THIS VISIT:  Medications  insulin regular, human (MYXREDLIN) 100 units/ 100 mL infusion (has no administration in time range)  dextrose 5 %-0.45 % sodium chloride infusion (0 mLs Intravenous Hold 08/03/19 2132)  dextrose 50 % solution 0-50 mL (has no administration in time range)  potassium chloride 10 mEq in 100 mL IVPB (10 mEq Intravenous New Bag/Given 08/03/19 2142)  0.9 %  sodium chloride infusion ( Intravenous New Bag/Given 08/03/19 2142)  morphine 4 MG/ML injection 6 mg (has no administration in time range)  sodium chloride flush (NS) 0.9 % injection 3 mL (3 mLs Intravenous Given 08/03/19 2026)  sodium chloride 0.9 % bolus 500 mL (500 mLs Intravenous Bolus from Bag 08/03/19 2025)  morphine 4 MG/ML injection 4 mg (4 mg Intravenous Given 08/03/19 2026)  ondansetron (ZOFRAN) injection 4 mg (4 mg Intravenous Given 08/03/19 2026)  iohexol (OMNIPAQUE) 300 MG/ML solution 100 mL (100 mLs Intravenous Contrast Given 08/03/19 2105)    Note:  This document was prepared using Dragon voice recognition software and may include unintentional dictation errors.  Nanda Quinton, MD, [facility] Emergency Medicine    Michaelann Gunnoe, Wonda Olds, MD 08/03/19 2215

## 2019-08-04 ENCOUNTER — Encounter (HOSPITAL_COMMUNITY): Payer: Self-pay | Admitting: Family Medicine

## 2019-08-04 DIAGNOSIS — E1159 Type 2 diabetes mellitus with other circulatory complications: Secondary | ICD-10-CM | POA: Diagnosis present

## 2019-08-04 DIAGNOSIS — E785 Hyperlipidemia, unspecified: Secondary | ICD-10-CM

## 2019-08-04 DIAGNOSIS — E1169 Type 2 diabetes mellitus with other specified complication: Secondary | ICD-10-CM | POA: Diagnosis present

## 2019-08-04 DIAGNOSIS — E781 Pure hyperglyceridemia: Secondary | ICD-10-CM

## 2019-08-04 DIAGNOSIS — E8729 Other acidosis: Secondary | ICD-10-CM

## 2019-08-04 DIAGNOSIS — I152 Hypertension secondary to endocrine disorders: Secondary | ICD-10-CM | POA: Diagnosis present

## 2019-08-04 DIAGNOSIS — I10 Essential (primary) hypertension: Secondary | ICD-10-CM | POA: Diagnosis present

## 2019-08-04 DIAGNOSIS — E872 Acidosis: Secondary | ICD-10-CM

## 2019-08-04 DIAGNOSIS — K8500 Idiopathic acute pancreatitis without necrosis or infection: Secondary | ICD-10-CM

## 2019-08-04 DIAGNOSIS — I700 Atherosclerosis of aorta: Secondary | ICD-10-CM

## 2019-08-04 LAB — COMPREHENSIVE METABOLIC PANEL
ALT: 71 U/L — ABNORMAL HIGH (ref 0–44)
AST: 42 U/L — ABNORMAL HIGH (ref 15–41)
Albumin: 3.5 g/dL (ref 3.5–5.0)
Alkaline Phosphatase: 62 U/L (ref 38–126)
Anion gap: 13 (ref 5–15)
BUN: 17 mg/dL (ref 6–20)
CO2: 19 mmol/L — ABNORMAL LOW (ref 22–32)
Calcium: 9 mg/dL (ref 8.9–10.3)
Chloride: 103 mmol/L (ref 98–111)
Creatinine, Ser: 1.12 mg/dL (ref 0.61–1.24)
GFR calc Af Amer: >60 mL/min (ref >60)
GFR calc non Af Amer: >60 mL/min (ref >60)
Glucose, Bld: 195 mg/dL — ABNORMAL HIGH (ref 70–99)
Potassium: 4.2 mmol/L (ref 3.5–5.1)
Sodium: 135 mmol/L (ref 135–145)
Total Bilirubin: 1.4 mg/dL — ABNORMAL HIGH (ref 0.3–1.2)
Total Protein: 6.8 g/dL (ref 6.5–8.1)

## 2019-08-04 LAB — CBG MONITORING, ED
Glucose-Capillary: 408 mg/dL — ABNORMAL HIGH (ref 70–99)
Glucose-Capillary: 384 mg/dL — ABNORMAL HIGH (ref 70–99)
Glucose-Capillary: 266 mg/dL — ABNORMAL HIGH (ref 70–99)
Glucose-Capillary: 196 mg/dL — ABNORMAL HIGH (ref 70–99)
Glucose-Capillary: 217 mg/dL — ABNORMAL HIGH (ref 70–99)
Glucose-Capillary: 227 mg/dL — ABNORMAL HIGH (ref 70–99)
Glucose-Capillary: 167 mg/dL — ABNORMAL HIGH (ref 70–99)
Glucose-Capillary: 224 mg/dL — ABNORMAL HIGH (ref 70–99)
Glucose-Capillary: 176 mg/dL — ABNORMAL HIGH (ref 70–99)
Glucose-Capillary: 182 mg/dL — ABNORMAL HIGH (ref 70–99)
Glucose-Capillary: 181 mg/dL — ABNORMAL HIGH (ref 70–99)
Glucose-Capillary: 221 mg/dL — ABNORMAL HIGH (ref 70–99)
Glucose-Capillary: 160 mg/dL — ABNORMAL HIGH (ref 70–99)
Glucose-Capillary: 203 mg/dL — ABNORMAL HIGH (ref 70–99)
Glucose-Capillary: 200 mg/dL — ABNORMAL HIGH (ref 70–99)
Glucose-Capillary: 180 mg/dL — ABNORMAL HIGH (ref 70–99)
Glucose-Capillary: 234 mg/dL — ABNORMAL HIGH (ref 70–99)

## 2019-08-04 LAB — BASIC METABOLIC PANEL
Anion gap: 10 (ref 5–15)
Anion gap: 12 (ref 5–15)
Anion gap: 14 (ref 5–15)
Anion gap: 9 (ref 5–15)
BUN: 15 mg/dL (ref 6–20)
BUN: 17 mg/dL (ref 6–20)
BUN: 18 mg/dL (ref 6–20)
BUN: 21 mg/dL — ABNORMAL HIGH (ref 6–20)
CO2: 19 mmol/L — ABNORMAL LOW (ref 22–32)
CO2: 20 mmol/L — ABNORMAL LOW (ref 22–32)
CO2: 20 mmol/L — ABNORMAL LOW (ref 22–32)
CO2: 23 mmol/L (ref 22–32)
Calcium: 8.2 mg/dL — ABNORMAL LOW (ref 8.9–10.3)
Calcium: 8.6 mg/dL — ABNORMAL LOW (ref 8.9–10.3)
Calcium: 8.6 mg/dL — ABNORMAL LOW (ref 8.9–10.3)
Calcium: 9 mg/dL (ref 8.9–10.3)
Chloride: 101 mmol/L (ref 98–111)
Chloride: 103 mmol/L (ref 98–111)
Chloride: 104 mmol/L (ref 98–111)
Chloride: 99 mmol/L (ref 98–111)
Creatinine, Ser: 0.91 mg/dL (ref 0.61–1.24)
Creatinine, Ser: 0.98 mg/dL (ref 0.61–1.24)
Creatinine, Ser: 1.02 mg/dL (ref 0.61–1.24)
Creatinine, Ser: 1.04 mg/dL (ref 0.61–1.24)
GFR calc Af Amer: >60 mL/min (ref >60)
GFR calc Af Amer: >60 mL/min (ref >60)
GFR calc Af Amer: >60 mL/min (ref >60)
GFR calc Af Amer: >60 mL/min (ref >60)
GFR calc non Af Amer: >60 mL/min (ref >60)
GFR calc non Af Amer: >60 mL/min (ref >60)
GFR calc non Af Amer: >60 mL/min (ref >60)
GFR calc non Af Amer: >60 mL/min (ref >60)
Glucose, Bld: 170 mg/dL — ABNORMAL HIGH (ref 70–99)
Glucose, Bld: 183 mg/dL — ABNORMAL HIGH (ref 70–99)
Glucose, Bld: 275 mg/dL — ABNORMAL HIGH (ref 70–99)
Glucose, Bld: 372 mg/dL — ABNORMAL HIGH (ref 70–99)
Potassium: 3.9 mmol/L (ref 3.5–5.1)
Potassium: 4 mmol/L (ref 3.5–5.1)
Potassium: 4.1 mmol/L (ref 3.5–5.1)
Potassium: 4.8 mmol/L (ref 3.5–5.1)
Sodium: 131 mmol/L — ABNORMAL LOW (ref 135–145)
Sodium: 132 mmol/L — ABNORMAL LOW (ref 135–145)
Sodium: 135 mmol/L (ref 135–145)
Sodium: 136 mmol/L (ref 135–145)

## 2019-08-04 LAB — LACTIC ACID, PLASMA
Lactic Acid, Venous: 2.4 mmol/L (ref 0.5–1.9)
Lactic Acid, Venous: 3.1 mmol/L (ref 0.5–1.9)

## 2019-08-04 LAB — BETA-HYDROXYBUTYRIC ACID
Beta-Hydroxybutyric Acid: 1 mmol/L — ABNORMAL HIGH (ref 0.05–0.27)
Beta-Hydroxybutyric Acid: 0.09 mmol/L (ref 0.05–0.27)
Beta-Hydroxybutyric Acid: 0.06 mmol/L (ref 0.05–0.27)

## 2019-08-04 LAB — HEMOGLOBIN A1C
Hgb A1c MFr Bld: 10.2 % — ABNORMAL HIGH (ref 4.8–5.6)
Mean Plasma Glucose: 246.04 mg/dL

## 2019-08-04 LAB — HIV ANTIBODY (ROUTINE TESTING W REFLEX): HIV Screen 4th Generation wRfx: NONREACTIVE

## 2019-08-04 LAB — TROPONIN I (HIGH SENSITIVITY): Troponin I (High Sensitivity): 7 ng/L (ref <18)

## 2019-08-04 MED ORDER — ATORVASTATIN CALCIUM 40 MG PO TABS
40.0000 mg | ORAL_TABLET | Freq: Every day | ORAL | Status: DC
  Administered 2019-08-04 – 2019-08-06 (×3): 40 mg via ORAL
  Filled 2019-08-04 (×3): qty 1

## 2019-08-04 MED ORDER — ENOXAPARIN SODIUM 60 MG/0.6ML ~~LOC~~ SOLN
60.0000 mg | SUBCUTANEOUS | Status: DC
  Administered 2019-08-05: 60 mg via SUBCUTANEOUS
  Filled 2019-08-04 (×2): qty 0.6

## 2019-08-04 MED ORDER — LIVING WELL WITH DIABETES BOOK
Freq: Once | Status: AC
  Filled 2019-08-04: qty 1

## 2019-08-04 MED ORDER — INSULIN GLARGINE 100 UNIT/ML ~~LOC~~ SOLN
22.0000 [IU] | Freq: Every day | SUBCUTANEOUS | Status: DC
  Administered 2019-08-04: 22 [IU] via SUBCUTANEOUS
  Filled 2019-08-04 (×2): qty 0.22

## 2019-08-04 MED ORDER — INSULIN ASPART 100 UNIT/ML ~~LOC~~ SOLN
0.0000 [IU] | Freq: Three times a day (TID) | SUBCUTANEOUS | Status: DC
  Administered 2019-08-04: 3 [IU] via SUBCUTANEOUS
  Administered 2019-08-05 (×2): 7 [IU] via SUBCUTANEOUS
  Administered 2019-08-05 – 2019-08-06 (×2): 5 [IU] via SUBCUTANEOUS
  Administered 2019-08-06: 7 [IU] via SUBCUTANEOUS

## 2019-08-04 NOTE — Progress Notes (Signed)
Family Medicine Teaching Service Daily Progress Note Intern Pager: [phone]  Patient name: Riley Wiggins Medical record number: [id number] Date of birth: [birth year] Age: 54 y.o. Gender: male  Primary Care Provider: Riki Rusk, PA Consultants: None  Code Status: Full  Pt Overview and Major Events to Date:  08/03/19: Admitted, Endotool started    Assessment and Plan: Riley Wiggins is a 54 y.o. male presenting with abdominal pain found to have acute pancreatitis. PMH is significant for gout, hyperlipidemia, type 2 diabetes, hypertension   Abdominal Pain likely Secondary to Acute Pancreatitis Patient presented with 2 days of constant non-radiating abdominal pain located in the central abdomen/epigastric region which has worsened over the past 2 days. Lipase in ED 2307 with mildly elevated AST/ALT and mild elevation in Tbili. CT significant for acute pancreatitis, moderate in severity with heterogenous decreased enhancement within the pancreatic head compatible with inflammatory changes and diffuse hepatic steatosis and small volume ascites. TGs were elevated to ~560. Denies EtOH use recently. RUQ did not show gallstones or bilary dilation. Troponin 7, not likely ACS.  Etiology of patient's pancreatitis unclear though possibly could be drug related as patient was taking Januvia.  AST 42, ALT 71 and T bili 1.4.  BHB 0.09.  Anion gap 13 from 18 on admission. -Follow-up a.m. CMP -Morphine 2 mg every 4 hours as needed -Continuous cardiopulmonary monitoring as patient is opioid nave -Trend lactic acid -Ice chips allowed, otherwise NPO   DKA   Type 2 diabetes Glucose on admission 362, BHB 1.16 with an elevated anion gap of 18 and a bicarb of 23.5.  Urine with glucose >500, 20 ketones.  Patient meet criteria for mild DKA.  A1c on admission 10.2. Home medications include Metformin 500 mg twice daily, but takes it 1000mg  QD.  -We will hold Metformin during the hospital setting -Continue  insulin gtt  -IVF normal saline at 100 mL/hr -Every 4hr BMP -Endo tool protocol -Monitor K while on insulin gtt -Urinalysis -N.p.o. until transition - Ice chips allowed  - DM educator   Patient meeting SIRS criteria without a source Lactic acidosis 3.3 however previously 2.4 but was 3.1 on admission.  VSS.  Lactic acidosis likely secondary to DKA.  No concern for acute infection at this time.  -Discontinue metronidazole and ceftriaxone -We will monitor for fevers -If patient develops symptoms concerning for infectious process will initiate antibiotic coverage as appropriate. -Follow-up blood culture: No growth to date  Hyperlipidemia Home medications include atorvastatin 40 mg/day.  Lipid panel this admission: total  cholesterol 189, HDL 43, triglycerides of 554 and LDL unable to calculate due to elevated triglycerides. -Give home statin  -Consider adding Zetia   Hypertension Normotensive this morning.  Home medication include lisinopril 20 mg/day.  -Hold lisinopril, restart once taking PO   History of tobacco abuse Patient with a previous smoking history of 1/2 pack/day for 20 years.  Endorses quitting 2 or 3 years ago.       FEN/GI: NPO, replete electrolytes as needed  PPx: Lovenox   Disposition: pending medical stabilization     Subjective:  Patient reports ongoing abdominal pain but has not been having nausea or vomiting.   Objective: Temp:  [97.8 F (36.6 C)] 97.8 F (36.6 C) (06/28 1511) Pulse Rate:  [34-107] 99 (06/29 0900) Resp:  [20-31] 20 (06/29 0900) BP: (103-140)/(68-97) 118/91 (06/29 0900) SpO2:  [87 %-99 %] 95 % (06/29 0900) Weight:  [122.5 kg] 122.5 kg (06/29 0810)  Physical Exam: General: Sitting upright in bed,  in no acute distress Cardiovascular: Regular rate and rhythm, DP pulses 2+ Respiratory: Clear lungs to auscultation, no increased work of breathing Abdomen: Soft, generalized tenderness worse in the epigastric and upper  quadrants, no guarding, no rebound, no palpable masses Extremities: No lower extremity edema, no calf tenderness  Laboratory: Recent Labs  Lab 08/03/19 1518 08/03/19 2037  WBC 16.8*  --   HGB 15.8 17.3*  HCT 46.2 51.0  PLT 186  --    Recent Labs  Lab 08/03/19 1518 08/03/19 1518 08/03/19 2037 08/04/19 0051 08/04/19 0627  NA 133*   < > 131* 132* 135  K 4.5   < > 4.6 4.8 4.2  CL 97*  --   --  99 103  CO2 18*  --   --  19* 19*  BUN 12  --   --  15 17  CREATININE 1.16  --   --  1.02 1.12  CALCIUM 9.7  --   --  9.0 9.0  PROT 7.1  --   --   --  6.8  BILITOT 2.1*  --   --   --  1.4*  ALKPHOS 87  --   --   --  62  ALT 106*  --   --   --  71*  AST 59*  --   --   --  42*  GLUCOSE 362*  --   --  372* 195*   < > = values in this interval not displayed.     Imaging/Diagnostic Tests: CT ABDOMEN PELVIS W CONTRAST  Result Date: 08/03/2019 CLINICAL DATA:  Left upper quadrant pain, pancreatitis, nausea EXAM: CT ABDOMEN AND PELVIS WITH CONTRAST TECHNIQUE: Multidetector CT imaging of the abdomen and pelvis was performed using the standard protocol following bolus administration of intravenous contrast. CONTRAST:  166mL OMNIPAQUE IOHEXOL 300 MG/ML  SOLN COMPARISON:  None. FINDINGS: Lower chest: No acute pleural or parenchymal lung disease. Hepatobiliary: There is diffuse hepatic steatosis without focal liver abnormality. Gallbladder is unremarkable. Pancreas: There is diffuse peripancreatic fat stranding with small volume ascites consistent with acute pancreatitis. Heterogeneous decreased enhancement within the head of the pancreas is noted, without well-defined fluid collection, abscess, or pseudocyst. Spleen: Normal in size without focal abnormality. Adrenals/Urinary Tract: 5 mm nonobstructing calculus is seen within the mid left kidney. Right kidney is unremarkable. No obstructive uropathy. The adrenals and bladder are normal. Stomach/Bowel: Scattered diverticulosis throughout the colon without  diverticulitis. No bowel obstruction or ileus. Normal appendix right lower quadrant. Vascular/Lymphatic: Branches. The splenic vein there is mild atherosclerosis of the aorta and its distal, superior mesenteric vein, and portal vein remain patent. No pathologic adenopathy. Reproductive: Prostate is unremarkable. Other: Extensive peripancreatic fat stranding with small amount of free fluid is seen throughout the upper abdomen compatible with pancreatitis. No fluid collection or abscess. No free intraperitoneal gas. No abdominal wall hernia. Musculoskeletal: No acute or destructive bony lesions. Reconstructed images demonstrate no additional findings. IMPRESSION: 1. Acute pancreatitis, moderate in severity. Heterogeneous decreased enhancement within the pancreatic head compatible with inflammatory changes, with no organized fluid collection, abscess, or pseudocyst at this time. 2. Diffuse hepatic steatosis. 3. Small volume ascites. 4. Diverticulosis without diverticulitis. 5.  Aortic Atherosclerosis (ICD10-I70.0). Electronically Signed   By: Randa Ngo M.D.   On: 08/03/2019 21:12   US Abdomen Limited RUQ  Result Date: 08/03/2019 CLINICAL DATA:  Pancreatitis.  Abdominal pain and nausea. EXAM: ULTRASOUND ABDOMEN LIMITED RIGHT UPPER QUADRANT COMPARISON:  Abdominal CT earlier today. FINDINGS: Gallbladder: Physiologically  distended. No gallstones or wall thickening visualized. No sonographic Murphy sign noted by sonographer. Common bile duct: Diameter: 4 mm, normal. Liver: No focal lesion identified. Heterogeneously increased in parenchymal echogenicity. Portal vein is patent on color Doppler imaging with normal direction of blood flow towards the liver. Other: None. IMPRESSION: 1. No gallstones or biliary dilatation. 2. Hepatic steatosis. Electronically Signed   By: Keith Rake M.D.   On: 08/03/2019 22:40     Lyndee Hensen, DO 08/04/2019, 11:57 AM PGY-1, [place] Intern pager:  [phone], text pages welcome

## 2019-08-04 NOTE — ED Notes (Signed)
CBG 196. Notified Anderson Malta, Therapist, sports.

## 2019-08-04 NOTE — ED Notes (Signed)
EndoTool and IV fluids discontinued per Dr. Ouida Sills verbal order. Pt given food and drink as well per doctor request.

## 2019-08-04 NOTE — Progress Notes (Addendum)
Inpatient Diabetes Program Recommendations  AACE/ADA: New Consensus Statement on Inpatient Glycemic Control (2015)  Target Ranges:  Prepandial:   less than 140 mg/dL      Peak postprandial:   less than 180 mg/dL (1-2 hours)      Critically ill patients:  140 - 180 mg/dL   Lab Results  Component Value Date   GLUCAP 167 (H) 08/04/2019   HGBA1C 10.2 (H) 08/04/2019    Review of Glycemic Control  Diabetes history: DM 2 Outpatient Diabetes medications: Metformin 500 mg bid Current orders for Inpatient glycemic control: IV insulin gtt  Inpatient Diabetes Program Recommendations:    Pt requiring between 4.6-11 units/hour with IV insulin gtt, very resistant, Will need at least a (0.3 units/kg for basal insulin dosing).   Consider: -  Lantus 35 units (0.3 units/kg) -  Novolog 0-15 units tid + hs   Noted no insurance and no PCP as he just moved to the area.  Based on pancreatitis/lactic acidosis/A1c 10.2. Pt to benefit from basal insulin at time of d/c. However will need close follow up with one of our clinics to be able to get his insulin with [employer] insulin for back up if in the plan of care.  Will see pt today.  Addendum 2:15 pm:  Spoke with patient and wife at bedside and showed insulin pen just in case. Discussed A1c level and need for glucose control. They had questions about why he had pancreatitis . 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 A1c 10.2%. Will need to be below 10% in order to drive a truck.  Pt may also be on insulin if a MD signs paperwork saying pt is safe to operate truck while on insulin. However pt would like to maximize oral therapy with strict lifestyle modifications in place. Unable if pt will be successful based on insulin gtt rates, however once triglycerides come down hopefully gtt rates will improve.  Discussed in detail dietary modification. Pt does not check glucose but maybe 1-2 times a week. Told pt  to check glucose 2 times a day.  May consider to maximize metformin 1000 mg bid and glipizide 10 mg bid with correction scale to see how his glucose trends look like without basal insulin and see if and how much basal insulin pt requires.   Called MD on call to discuss plan of care.  Thanks,  Tama Headings RN, MSN, BC-ADM Inpatient Diabetes Coordinator Team Pager [phone] (8a-5p)

## 2019-08-04 NOTE — Progress Notes (Signed)
Patient's MEWs turned to red for heart rate, BP and resp. Discussed patient's conditions with charge Nurse Cyril Mourning. No acute change and distress noted. Patient was alert and oriented x4. Will continue monitor.

## 2019-08-04 NOTE — Progress Notes (Signed)
Patient received to the unit from ED. Patient is alert and oriented x4. Iv in place and running fluid. Skin assessment done with another nurse. No skin break down noted on examination. Given medicine for pian. Given  instructions about call bell and phone. Bed in low position and call bell in reach.

## 2019-08-04 NOTE — ED Notes (Signed)
Attempted to update pt's wife Lattie Haw), with pt's permission - no answer

## 2019-08-04 NOTE — ED Notes (Signed)
CBG 217  

## 2019-08-04 NOTE — ED Notes (Signed)
Dr. Brown at bedside

## 2019-08-05 LAB — BASIC METABOLIC PANEL
Anion gap: 11 (ref 5–15)
BUN: 22 mg/dL — ABNORMAL HIGH (ref 6–20)
CO2: 19 mmol/L — ABNORMAL LOW (ref 22–32)
Calcium: 8.1 mg/dL — ABNORMAL LOW (ref 8.9–10.3)
Chloride: 99 mmol/L (ref 98–111)
Creatinine, Ser: 1.07 mg/dL (ref 0.61–1.24)
GFR calc Af Amer: >60 mL/min (ref >60)
GFR calc non Af Amer: >60 mL/min (ref >60)
Glucose, Bld: 288 mg/dL — ABNORMAL HIGH (ref 70–99)
Potassium: 4.2 mmol/L (ref 3.5–5.1)
Sodium: 129 mmol/L — ABNORMAL LOW (ref 135–145)

## 2019-08-05 LAB — MRSA PCR SCREENING: MRSA by PCR: NEGATIVE

## 2019-08-05 LAB — GLUCOSE, CAPILLARY
Glucose-Capillary: 281 mg/dL — ABNORMAL HIGH (ref 70–99)
Glucose-Capillary: 345 mg/dL — ABNORMAL HIGH (ref 70–99)
Glucose-Capillary: 367 mg/dL — ABNORMAL HIGH (ref 70–99)
Glucose-Capillary: 348 mg/dL — ABNORMAL HIGH (ref 70–99)
Glucose-Capillary: 265 mg/dL — ABNORMAL HIGH (ref 70–99)

## 2019-08-05 LAB — CBC
HCT: 45 % (ref 39.0–52.0)
Hemoglobin: 14.9 g/dL (ref 13.0–17.0)
MCH: 33.4 pg (ref 26.0–34.0)
MCHC: 33.1 g/dL (ref 30.0–36.0)
MCV: 100.9 fL — ABNORMAL HIGH (ref 80.0–100.0)
Platelets: 134 10*3/uL — ABNORMAL LOW (ref 150–400)
RBC: 4.46 MIL/uL (ref 4.22–5.81)
RDW: 13.4 % (ref 11.5–15.5)
WBC: 11.8 10*3/uL — ABNORMAL HIGH (ref 4.0–10.5)
nRBC: 0 % (ref 0.0–0.2)

## 2019-08-05 MED ORDER — INSULIN GLARGINE 100 UNIT/ML ~~LOC~~ SOLN
30.0000 [IU] | Freq: Every day | SUBCUTANEOUS | Status: DC
  Administered 2019-08-05 – 2019-08-06 (×2): 30 [IU] via SUBCUTANEOUS
  Filled 2019-08-05 (×2): qty 0.3

## 2019-08-05 MED ORDER — METFORMIN HCL ER 500 MG PO TB24
500.0000 mg | ORAL_TABLET | Freq: Every day | ORAL | Status: DC
  Administered 2019-08-05 – 2019-08-06 (×2): 500 mg via ORAL
  Filled 2019-08-05 (×2): qty 1

## 2019-08-05 NOTE — Care Management (Signed)
MATCH entered in [place] for dates 6/30 to 7/8.  MD please send Rxs to [place] by Friday.

## 2019-08-05 NOTE — Plan of Care (Signed)
Patient A&O x4. VSS. Free from falls. Pt turns self in bed. Ambulated in room independently. Pt tolerating fluids and meals.  Pt voiding adequately during shift.DVT prophylactic: lovenox. No c/o of pain. BG managed. Bed wheels locked. Phone and call bell within reach. Pt is resting, no distress. POC reviewed with patient and wife.  Problem: Education: Goal: Knowledge of General Education information will improve Description: Including pain rating scale, medication(s)/side effects and non-pharmacologic comfort measures Outcome: Progressing   Problem: Health Behavior/Discharge Planning: Goal: Ability to manage health-related needs will improve Outcome: Progressing   Problem: Clinical Measurements: Goal: Ability to maintain clinical measurements within normal limits will improve Outcome: Progressing Goal: Will remain free from infection Outcome: Progressing Goal: Diagnostic test results will improve Outcome: Progressing Goal: Respiratory complications will improve Outcome: Progressing Goal: Cardiovascular complication will be avoided Outcome: Progressing   Problem: Activity: Goal: Risk for activity intolerance will decrease Outcome: Progressing   Problem: Nutrition: Goal: Adequate nutrition will be maintained Outcome: Progressing   Problem: Coping: Goal: Level of anxiety will decrease Outcome: Progressing   Problem: Elimination: Goal: Will not experience complications related to bowel motility Outcome: Progressing Goal: Will not experience complications related to urinary retention Outcome: Progressing   Problem: Pain Managment: Goal: General experience of comfort will improve Outcome: Progressing   Problem: Safety: Goal: Ability to remain free from injury will improve Outcome: Progressing   Problem: Skin Integrity: Goal: Risk for impaired skin integrity will decrease Outcome: Progressing

## 2019-08-05 NOTE — Progress Notes (Addendum)
Family Medicine Teaching Service Daily Progress Note Intern Pager: [phone]  Patient name: Riley Wiggins Medical record number: [id number] Date of birth: [birth year] Age: 54 y.o. Gender: male  Primary Care Provider: Riki Rusk, PA Consultants: None  Code Status: Full  Pt Overview and Major Events to Date:  08/03/19: Admitted, Endotool started    Assessment and Plan: Riley Wiggins is a 54 y.o. male presenting with abdominal pain found to have acute pancreatitis. PMH is significant for gout, hyperlipidemia, type 2 diabetes, hypertension   Abdominal Pain likely Secondary to Acute Pancreatitis Continues to have diffuse abdominal tenderness to palpation, this morning.  Etiology of patient's pancreatitis unclear though possibly could be drug related as patient had been taking Januvia.   -Follow-up AM CMP -Morphine 2 mg every 4 hours as needed -Continuous cardiopulmonary monitoring as patient is opioid nave -Transfer patient to med telemetry   DKA   Type 2 diabetes Patient transitioned with dinner yesterday.  Pseudohyponatremia, sodium corrects to 134 via [place] Na correction.  Fasting glucose 281.  Patient received 3 units sliding scale insulin and 22 units glargine yesterday. Patient meet criteria for mild DKA on admission and now resolved. A1c on admission 10.2. Home medications include Metformin 500 mg twice daily, but takes it 1000mg  QD. -Discontined insulin gtt  -Discontinued IVF -AM BMP -Carb modified diet -DM education  -Restart Metformin -Patient may benefit from SGLT2, GLP-1 outpatient. Pt's PCP wanted him on Farxiga but it was too expensive for him.    Patient meeting SIRS criteria without a source Lactic acidosis likely secondary to DKA.  No concern for acute infection at this time.  -Discontinue metronidazole and ceftriaxone -We will monitor for fevers -If patient develops symptoms concerning for infectious process will initiate antibiotic coverage as  appropriate. -Follow-up blood culture: No growth to date  Hyperlipidemia Home medications include atorvastatin 40 mg/day.  Patient was splitting this medication in half.  Lipid panel this admission: total  cholesterol 189, HDL 43, triglycerides of 554 and LDL unable to calculate due to elevated triglycerides. -Contine home statin  -Consider adding Zetia   Hypertension Normotensive this morning. Home medication include telmisartan 80 mg, amlodipine 10 mg and low-dose aspirin daily. -Hold home telmisartan and amlodipine as patient is normotensive -Can restart telmisartan for type 2 diabetes and benefit    History of tobacco abuse Patient with a previous smoking history of 1/2 pack/day for 20 years.  Endorses quitting 2 or 3 years ago.  Gout Patient takes 100 mg allopurinol. -Hold home allopurinol   FEN/GI: carb modified, replete electrolytes as needed  PPx: Lovenox   Disposition: pending medical stabilization     Subjective:  Continues to have significant abdominal pain rated 9 out of 10.  Denies nausea, vomiting.  Patient recently moved to [facility] and does not want to have to start insulin.  Objective: Temp:  [98.2 F (36.8 C)-100 F (37.8 C)] 98.4 F (36.9 C) (06/30 0730) Pulse Rate:  [74-120] 100 (06/30 0730) Resp:  [15-28] 20 (06/30 0730) BP: (92-121)/(64-91) 104/76 (06/30 0730) SpO2:  [89 %-100 %] 97 % (06/30 0730)  Physical Exam: GEN: Resting comfortably watching television, in no acute distress  CV: regular rate and rhythm, no murmurs appreciated  RESP: no increased work of breathing, clear to ascultation bilaterally ABD: Bowel sounds present.  Soft, moderate diffuse tenderness to palpation, worse left upper quadrant and epigastric areas MSK: no edema, or calf tenderness SKIN: warm, dry     Laboratory: Recent Labs  Lab 08/03/19 1518 08/03/19  2037 08/05/19 0503  WBC 16.8*  --  11.8*  HGB 15.8 17.3* 14.9  HCT 46.2 51.0 45.0  PLT 186  --  134*    Recent Labs  Lab 08/03/19 1518 08/03/19 2037 08/04/19 0627 08/04/19 1117 08/04/19 1436 08/04/19 2253 08/05/19 0503  NA 133*   < > 135   < > 135 131* 129*  K 4.5   < > 4.2   < > 3.9 4.0 4.2  CL 97*   < > 103   < > 103 101 99  CO2 18*   < > 19*   < > 20* 20* 19*  BUN 12   < > 17   < > 18 21* 22*  CREATININE 1.16   < > 1.12   < > 0.91 0.98 1.07  CALCIUM 9.7   < > 9.0   < > 8.6* 8.2* 8.1*  PROT 7.1  --  6.8  --   --   --   --   BILITOT 2.1*  --  1.4*  --   --   --   --   ALKPHOS 87  --  62  --   --   --   --   ALT 106*  --  71*  --   --   --   --   AST 59*  --  42*  --   --   --   --   GLUCOSE 362*   < > 195*   < > 170* 275* 288*   < > = values in this interval not displayed.     Imaging/Diagnostic Tests: No results found.   Lyndee Hensen, DO 08/05/2019, 8:47 AM PGY-1, [place] Intern pager: [phone], text pages welcome

## 2019-08-05 NOTE — Progress Notes (Signed)
Inpatient Diabetes Program Recommendations  AACE/ADA: New Consensus Statement on Inpatient Glycemic Control   Target Ranges:  Prepandial:   less than 140 mg/dL      Peak postprandial:   less than 180 mg/dL (1-2 hours)      Critically ill patients:  140 - 180 mg/dL  Results for Riley Wiggins, Riley Wiggins (MRN [id number]) as of 08/05/2019 07:17  Ref. Range 08/05/2019 05:03  Glucose Latest Ref Range: 70 - 99 mg/dL 288 (H)   Results for [name], [name] (MRN [id number]) as of 08/05/2019 07:17  Ref. Range 08/04/2019 11:09 08/04/2019 11:42 08/04/2019 12:42 08/04/2019 13:20 08/04/2019 13:48 08/04/2019 15:06 08/04/2019 16:17 08/04/2019 17:22 08/04/2019 18:32  Glucose-Capillary Latest Ref Range: 70 - 99 mg/dL 176 (H) 182 (H) 181 (H) 221 (H) 160 (H) 203 (H) 200 (H) 180 (H) 234 (H)   Results for [name], [name] (MRN [id number]) as of 08/05/2019 07:17  Ref. Range 08/04/2019 00:51  Hemoglobin A1C Latest Ref Range: 4.8 - 5.6 % 10.2 (H)   Review of Glycemic Control  Diabetes history: DM2 Outpatient Diabetes medications: Metformin 500 mg BID Current orders for Inpatient glycemic control: Lantus 22 units daily, Novolog 0-9 units TID with meals  Inpatient Diabetes Program Recommendations:   Insulin - Basal: Please consider increasing Lantus to 25 units daily.  Correction (SSI): Please consider increasing Novolog correction to moderate scale (0-15 units) TID and adding Novolog 0-5 units QHS for bedtime correction.  Insulin - Meal Coverage: Please consider ordering Novolog 4 units TID wtih meals for meal coverage if patient eats at least 50% of meals.  HbgA1C:  A1C 10.2% on 08/04/19 indicating an average glucose of 246 mg/dl over the past 2-3 months. 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 Patient has no insurance and will need affordable DM medications or may need medication  assistance.  Thanks, Barnie Alderman, RN, MSN, CDE Diabetes Coordinator Inpatient Diabetes Program [phone] (Team Pager from 8am to 5pm)

## 2019-08-05 NOTE — TOC Initial Note (Signed)
Transition of Care [facility]) - Initial/Assessment Note    Patient Details  Name: Riley Wiggins MRN: [id number] Date of Birth: Nov 21, 1965  [facility] CM/SW Contact:    Carles Collet, RN Phone Number: 08/05/2019, 7:55 AM  Clinical Narrative:       Spoke w patient. He confirms that he does not have a PCP. He is agreeable to appointment at [facility]. CMA to schedule and notify patient of appointment and enter it on AVS.  TOC pharmacy added to profile.  Please send DC scripts to them by Friday so patient can be sent home with medications. Will follow for MATCH needs.             Expected Discharge Plan: Home/Self Care Barriers to Discharge: Continued Medical Work up   Patient Goals and CMS Choice        Expected Discharge Plan and Services Expected Discharge Plan: Home/Self Care   Discharge Planning Services: CM Consult, [facility], [facility], Follow-up appt scheduled, Medication Assistance                                          Prior Living Arrangements/Services                       Activities of Daily Living      Permission Sought/Granted                  Emotional Assessment              Admission diagnosis:  Pancreatitis [K85.90] Pancreatitis due to biliary obstruction [K85.90, K83.1] Acute pancreatitis, unspecified complication status, unspecified pancreatitis type [K85.90] Patient Active Problem List   Diagnosis Date Noted  . Essential hypertension 08/04/2019  . Aortic atherosclerosis ([place]) 08/04/2019  . Dyslipidemia 08/04/2019  . High anion gap metabolic acidosis 65/53/7482  . Hypertriglyceridemia 08/04/2019  . Idiopathic acute pancreatitis 08/03/2019  . Diabetic ketoacidosis without coma associated with type 2 diabetes mellitus ([place])    PCP:  Riki Rusk, PA Pharmacy:   [facility] [address] ([address]), [place]) [place]  [id number] Phone: [phone] Fax: [phone]     Social Determinants of Health (SDOH) Interventions    Readmission Risk Interventions No flowsheet data found.

## 2019-08-06 LAB — CBC
HCT: 38 % — ABNORMAL LOW (ref 39.0–52.0)
Hemoglobin: 13 g/dL (ref 13.0–17.0)
MCH: 34 pg (ref 26.0–34.0)
MCHC: 34.2 g/dL (ref 30.0–36.0)
MCV: 99.5 fL (ref 80.0–100.0)
Platelets: 147 10*3/uL — ABNORMAL LOW (ref 150–400)
RBC: 3.82 MIL/uL — ABNORMAL LOW (ref 4.22–5.81)
RDW: 13.1 % (ref 11.5–15.5)
WBC: 12.1 10*3/uL — ABNORMAL HIGH (ref 4.0–10.5)
nRBC: 0 % (ref 0.0–0.2)

## 2019-08-06 LAB — BASIC METABOLIC PANEL
Anion gap: 9 (ref 5–15)
BUN: 20 mg/dL (ref 6–20)
CO2: 21 mmol/L — ABNORMAL LOW (ref 22–32)
Calcium: 8.6 mg/dL — ABNORMAL LOW (ref 8.9–10.3)
Chloride: 99 mmol/L (ref 98–111)
Creatinine, Ser: 0.85 mg/dL (ref 0.61–1.24)
GFR calc Af Amer: >60 mL/min (ref >60)
GFR calc non Af Amer: >60 mL/min (ref >60)
Glucose, Bld: 269 mg/dL — ABNORMAL HIGH (ref 70–99)
Potassium: 3.3 mmol/L — ABNORMAL LOW (ref 3.5–5.1)
Sodium: 129 mmol/L — ABNORMAL LOW (ref 135–145)

## 2019-08-06 LAB — GLUCOSE, CAPILLARY
Glucose-Capillary: 313 mg/dL — ABNORMAL HIGH (ref 70–99)
Glucose-Capillary: 269 mg/dL — ABNORMAL HIGH (ref 70–99)

## 2019-08-06 MED ORDER — POTASSIUM CHLORIDE CRYS ER 20 MEQ PO TBCR
40.0000 meq | EXTENDED_RELEASE_TABLET | Freq: Once | ORAL | Status: AC
  Administered 2019-08-06: 40 meq via ORAL
  Filled 2019-08-06: qty 2

## 2019-08-06 MED ORDER — LOSARTAN POTASSIUM 50 MG PO TABS
50.0000 mg | ORAL_TABLET | Freq: Every day | ORAL | 0 refills | Status: DC
Start: 2019-08-06 — End: 2019-08-14

## 2019-08-06 MED ORDER — METFORMIN HCL ER 500 MG PO TB24: 500.0000 mg | ORAL_TABLET | Freq: Every day | ORAL | 0 refills | Status: DC

## 2019-08-06 MED ORDER — TRAMADOL HCL 50 MG PO TABS: 50.0000 mg | ORAL_TABLET | Freq: Three times a day (TID) | ORAL | 0 refills | Status: DC | PRN

## 2019-08-06 MED ORDER — LOSARTAN POTASSIUM 50 MG PO TABS
50.0000 mg | ORAL_TABLET | Freq: Every day | ORAL | 0 refills | Status: DC
Start: 2019-08-06 — End: 2019-08-06

## 2019-08-06 MED FILL — traMADol HCL 50 MG TABS: 50 | 3 days supply | Qty: 10 | Fill #0

## 2019-08-06 MED FILL — METFORMIN HCL ER 500 MG TB2: 500 | 30 days supply | Qty: 60 | Fill #0

## 2019-08-06 MED FILL — LOSARTAN POTASSIUM 50 MG TA: 50 | 30 days supply | Qty: 30 | Fill #0

## 2019-08-06 NOTE — Progress Notes (Signed)
[name] to be D/C'd Home per MD order.  Discussed with the patient and all questions fully answered.  VSS, Skin clean, dry and intact without evidence of skin break down, no evidence of skin tears noted. IV catheter discontinued intact. Site without signs and symptoms of complications. Dressing and pressure applied.  An After Visit Summary was printed and given to the patient. Patient received all medication.  D/c education completed with patient/family including follow up instructions, medication list, d/c activities limitations if indicated, with other d/c instructions as indicated by MD - patient able to verbalize understanding, all questions fully answered.   Patient instructed to return to ED, call 911, or call MD for any changes in condition.   Patient escorted via [place], and D/C home via private auto.  [place] 08/06/2019 3:59 PM

## 2019-08-06 NOTE — Discharge Summary (Addendum)
[facility] Discharge Summary  Patient name: Riley Wiggins Medical record number: [id number] Date of birth: Feb 07, 1965 Age: 54 y.o. Gender: male Date of Admission: 08/03/2019  Date of Discharge: 08/06/2019 Admitting Physician: Martyn Malay, MD  Primary Care Provider: Riki Rusk, [place] Consultants: None  Indication for Hospitalization: Acute pancreatitis, DKA  Discharge Diagnoses/Problem List:  Pancreatitis DKA Type 2 diabetes Hypertension Gout HLD   Disposition: Home   Discharge Condition: improved   Discharge Exam:  General: Patient is lying in bed and appears content. Cardiovascular: regular rate and rhythm no murmurs appreciated. Respiratory: No increased work of breathing, Equal sounds bilaterally Abdomen:Diffuse tenderness to palpation worse in the RUQ to palpation.  Extremities: No edema  Taken from Dr. Ellan Lambert progress note on the day of discharge  Brief Hospital Course:  Acute pancreatitis Patient presented with worsening 2 days of constant non-radiating abdominal pain located in the central abdomen/epigastric region.  ED labs notable for lipase 2307 with mildly elevated AST/ALT and mild elevation in Tbili. CT significant for acute pancreatitis, moderate in severity with heterogenous decreased enhancement within the pancreatic head compatible with inflammatory changes and diffuse hepatic steatosis and small volume ascites. TGs were elevated to ~560.  Patient with history of gout.  Denies EtOH overuse.  Reported drinking 3-12 ounce beers the weekend prior. RUQ did not show gallstones or bilary dilation. Etiology of patient's pancreatitis unclear though most likely idiopathic but possibly could be drug related as patient was taking Januvia versus alcohol use versus hypertriglyceridemia.  T bili, AST and ALT normalized prior to discharge.  Patient's pain was well controlled with oral therapy.  Patient discharged with tramadol as  needed.  Diabetic ketoacidosis Patient met criteria for mild on admission.  Glucose 362, BHB 1.16 with an elevated anion gap of 18 and a bicarb of 23.5.  Glucosuria >500 with 20 ketones. A1c was 10.2.  Patient with incomplete compliance with his home regimen of Metformin.  A1c this admission was 10.2.  Patient has taken [place] and Vania Rea recently as he was given samples at his PCPs office.  DKA protocol followed and Endo tool was utilized.  Patient was transitioned to SQ insulin on day 1 at dinner.  Metformin was restarted on day 2.  Patient to continue Metformin outpatient.  Of note, patient may benefit from SGLT2 versus GLP-1. Diabetes educator spoke with patient at length.  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  Hypertension Patient remained normotensive throughout most of his admission.  Patient losartan was restarted at 50 mg daily.  Continue to titrate this outpatient.  Home medications were continued for other chronic diseases which remained stable.    Issues for Follow Up:  1. Repeat CMP 2. Discuss DM regimen.  Patient may benefit from SGLT2 versus GLP-1 outpatient. 3. Titrate losartan, continue to monitor blood pressure 4. Reinforce the need for medication adherence  Significant Procedures:   Significant Labs and Imaging:  Recent Labs  Lab 08/03/19 1518 08/03/19 1518 08/03/19 2037 08/05/19 0503 08/06/19 0033  WBC 16.8*  --   --  11.8* 12.1*  HGB 15.8   < > 17.3* 14.9 13.0  HCT 46.2   < > 51.0 45.0 38.0*  PLT 186  --   --  134* 147*   < > = values in this interval not displayed.   Recent Labs  Lab 08/03/19  1518 08/03/19 2037 08/04/19 0627 08/04/19 0627 08/04/19 1117 08/04/19 1117 08/04/19 1436 08/04/19 1436 08/04/19 2253 08/04/19 2253 08/05/19 0503 08/06/19 0033  NA 133*   < > 135   < > 136  --  135  --  131*  --  129* 129*  K 4.5   < >  4.2   < > 4.1   < > 3.9   < > 4.0   < > 4.2 3.3*  CL 97*   < > 103   < > 104  --  103  --  101  --  99 99  CO2 18*   < > 19*   < > 23  --  20*  --  20*  --  19* 21*  GLUCOSE 362*   < > 195*   < > 183*  --  170*  --  275*  --  288* 269*  BUN 12   < > 17   < > 17  --  18  --  21*  --  22* 20  CREATININE 1.16   < > 1.12   < > 1.04  --  0.91  --  0.98  --  1.07 0.85  CALCIUM 9.7   < > 9.0   < > 8.6*  --  8.6*  --  8.2*  --  8.1* 8.6*  ALKPHOS 87  --  62  --   --   --   --   --   --   --   --   --   AST 59*  --  42*  --   --   --   --   --   --   --   --   --   ALT 106*  --  71*  --   --   --   --   --   --   --   --   --   ALBUMIN 4.3  --  3.5  --   --   --   --   --   --   --   --   --    < > = values in this interval not displayed.    CT ABDOMEN PELVIS W CONTRAST  Result Date: 08/03/2019 CLINICAL DATA:  Left upper quadrant pain, pancreatitis, nausea EXAM: CT ABDOMEN AND PELVIS WITH CONTRAST TECHNIQUE: Multidetector CT imaging of the abdomen and pelvis was performed using the standard protocol following bolus administration of intravenous contrast. CONTRAST:  136m OMNIPAQUE IOHEXOL 300 MG/ML  SOLN COMPARISON:  None. FINDINGS: Lower chest: No acute pleural or parenchymal lung disease. Hepatobiliary: There is diffuse hepatic steatosis without focal liver abnormality. Gallbladder is unremarkable. Pancreas: There is diffuse peripancreatic fat stranding with small volume ascites consistent with acute pancreatitis. Heterogeneous decreased enhancement within the head of the pancreas is noted, without well-defined fluid collection, abscess, or pseudocyst. Spleen: Normal in size without focal abnormality. Adrenals/Urinary Tract: 5 mm nonobstructing calculus is seen within the mid left kidney. Right kidney is unremarkable. No obstructive uropathy. The adrenals and bladder are normal. Stomach/Bowel: Scattered diverticulosis throughout the colon without diverticulitis. No bowel obstruction or ileus. Normal  appendix right lower quadrant. Vascular/Lymphatic: Branches. The splenic vein there is mild atherosclerosis of the aorta and its distal, superior mesenteric vein, and portal vein remain patent. No pathologic adenopathy. Reproductive: Prostate is unremarkable. Other: Extensive peripancreatic fat stranding with small amount of free fluid is seen throughout the upper abdomen compatible with  pancreatitis. No fluid collection or abscess. No free intraperitoneal gas. No abdominal wall hernia. Musculoskeletal: No acute or destructive bony lesions. Reconstructed images demonstrate no additional findings. IMPRESSION: 1. Acute pancreatitis, moderate in severity. Heterogeneous decreased enhancement within the pancreatic head compatible with inflammatory changes, with no organized fluid collection, abscess, or pseudocyst at this time. 2. Diffuse hepatic steatosis. 3. Small volume ascites. 4. Diverticulosis without diverticulitis. 5.  Aortic Atherosclerosis (ICD10-I70.0). Electronically Signed   By: Randa Ngo M.D.   On: 08/03/2019 21:12   US Abdomen Limited RUQ  Result Date: 08/03/2019 CLINICAL DATA:  Pancreatitis.  Abdominal pain and nausea. EXAM: ULTRASOUND ABDOMEN LIMITED RIGHT UPPER QUADRANT COMPARISON:  Abdominal CT earlier today. FINDINGS: Gallbladder: Physiologically distended. No gallstones or wall thickening visualized. No sonographic Murphy sign noted by sonographer. Common bile duct: Diameter: 4 mm, normal. Liver: No focal lesion identified. Heterogeneously increased in parenchymal echogenicity. Portal vein is patent on color Doppler imaging with normal direction of blood flow towards the liver. Other: None. IMPRESSION: 1. No gallstones or biliary dilatation. 2. Hepatic steatosis. Electronically Signed   By: Keith Rake M.D.   On: 08/03/2019 22:40     Results/Tests Pending at Time of Discharge:   Discharge Medications:  Allergies as of 08/06/2019   No Known Allergies     Medication List     STOP taking these medications   ciprofloxacin-dexamethasone OTIC suspension Commonly known as: Ciprodex   lisinopril 20 MG tablet Commonly known as: ZESTRIL   metFORMIN 500 MG tablet Commonly known as: GLUCOPHAGE Replaced by: metFORMIN 500 MG 24 hr tablet     TAKE these medications   allopurinol 100 MG tablet Commonly known as: ZYLOPRIM Take 200 mg by mouth daily.   aspirin 81 MG chewable tablet Chew 81 mg by mouth daily.   atorvastatin 40 MG tablet Commonly known as: LIPITOR Take 1 tablet by mouth at bedtime.   losartan 50 MG tablet Commonly known as: Cozaar Take 1 tablet (50 mg total) by mouth daily.   metFORMIN 500 MG 24 hr tablet Commonly known as: GLUCOPHAGE-XR Take 1 tablet (500 mg total) by mouth daily with breakfast. Increase to 2 tablets in about 3-4 days Start taking on: August 07, 2019 Replaces: metFORMIN 500 MG tablet   traMADol 50 MG tablet Commonly known as: Ultram Take 1 tablet (50 mg total) by mouth every 8 (eight) hours as needed for severe pain.       Discharge Instructions: Please refer to Patient Instructions section of EMR for full details.  Patient was counseled important signs and symptoms that should prompt return to medical care, changes in medications, dietary instructions, activity restrictions, and follow up appointments.   Follow-Up Appointments:  Follow-up Information    [place]. Go on 09/16/2019.   Why: To establish a primary care provider. There is also a low cost pharmacy here where you can get prescriptions filled.  at 8:50am for hospital follow-up They will also assist you with obtaining medicaid if possible.  Contact information: [place] [id number] [place]. Go on 08/14/2019.   Specialty: Family Medicine Why: At 8:55 am with Dr. Susa Simmonds for hospital follow up.  Contact information: [address] [id number] [place] [phone]              Lyndee Hensen, DO 08/06/2019, 11:30 PM PGY-2, [place]

## 2019-08-06 NOTE — Discharge Instructions (Signed)
Dear Riley Wiggins,  Thank you for letting us participate in your care. You were hospitalized for acute pancreatitis and diabetic ketoacidosis and diagnosed with Idiopathic acute pancreatitis.   POST-HOSPITAL & CARE INSTRUCTIONS 1. Please follow up with your PCP. 2. For pain you can take tylenol regular strength every 4 hours. 3. Start metformin 1500 mg daily, can increase to 2,000mg  total daily after 1-2 weeks.  4. You will likely need to be on a second medication for your diabetes. Such as GLP-1 agonist or SGLT2-inhibitor.  5. Go to your follow up appointments (listed below)   DOCTOR'S APPOINTMENT   Future Appointments  Date Time Provider [place]  09/16/2019  8:50 AM Gildardo Pounds, NP [facility] None    Follow-up Information    [place]. Go on 09/16/2019.   Why: To establish a primary care provider. There is also a low cost pharmacy here where you can get prescriptions filled.  at 8:50am for hospital follow-up They will also assist you with obtaining medicaid if possible.  Contact information: [place] [address] [phone]              Take care and be well!  [facility]  [place], [place] [address] [phone]

## 2019-08-06 NOTE — Progress Notes (Signed)
Family Medicine Teaching Service Daily Progress Note Intern Pager: [phone]  Patient name: Riley Wiggins Medical record number: [id number] Date of birth: 26-Jun-1965 Age: 54 y.o. Gender: male  Primary Care Provider: Riki Rusk, PA Consultants: None Code Status: Full  Pt Overview and Major Events to Date:  08/03/19: Admitted, Endotool started    Assessment and Plan:  Riley Wiggins is a 54 y.o. malepresenting with abdominal pain found to have acute pancreatitis.PMH is significant for gout, hyperlipidemia, type 2 diabetes, hypertension   Abdominal Pain likely Secondary to Acute Pancreatitis Continues to have diffuse abdominal tenderness to palpation, this morning.  Etiology of patient's pancreatitis unclear though possibly could be drug related as patient had been taking Januvia versus elevated Triglycerides. -Follow-up AM BMP -Morphine 2 mg every 4 hours discontinued, Recommend patient take Tylenol PRN for pain. -Continuous cardiopulmonary monitoring as patient is opioid nave    DKA- resolved   Type 2 diabetes .Pseudohyponatremia, sodium corrects to 134 via [place] Na correction. Glucose was 206.  Patient received 19 units sliding scale insulin and 30 units glargine yesterday. Patient met criteria for mild DKA on admission and now resolved. A1c on admission 10.2. Home medications include Metformin 500 mg twice daily, but takes it '1000mg'$  QD. -Discontined insulin gtt  -Discontinued IVF -monitor AM BMP -Carb modified diet -DM education  -Continue Metformin -Patient may benefit from SGLT2, GLP-1 outpatient. Pt's PCP wanted him on Farxiga but it was too expensive for him. Patient will be set up with the [facility].   Patient meeting SIRS criteria without a source Lactic acidosis likely secondary to DKA.  No concern for acute infection at this time.  -Discontinued metronidazole and ceftriaxone, 6/28 -We will monitor for fevers -If patient  develops symptoms concerning for infectious process will initiate antibiotic coverage as appropriate. -Follow-up blood culture: No growth to date  Hyperlipidemia Home medications include atorvastatin 40 mg/day.  Patient was splitting this medication in half.  Lipid panel this admission: total  cholesterol 189, HDL 43, triglycerides of 554 and LDL unable to calculate due to elevated triglycerides. -Continue home statin  -Consider adding fibrate or over the counter  Fish oil to lower Triglycerides.   Hypertension Normotensive this morning. Home medication include telmisartan 80 mg, amlodipine 10 mg and low-dose aspirin daily. -Can restart telmisartan for type 2 diabetes and benefit  - Can restart home ASA   History of tobacco abuse Patient with a previous smoking history of 1/2 pack/day for 20 years.  Endorses quitting 2 or 3 years ago.  Gout Patient takes 100 mg allopurinol. - Restart home allopurinol at discharge   FEN/GI: Carb modified, replete electrolytes as needed PPx: Lovenox Disposition: Discharge home  Subjective:  Patient reports that his pain is improving. Patient and wife are attempting to set patient up with the  [facility] for follow up and diabetes management.  Objective: Temp:  [98.2 F (36.8 C)-98.4 F (36.9 C)] 98.3 F (36.8 C) (07/01 0517) Pulse Rate:  [92-100] 92 (07/01 0517) Resp:  [19-20] 19 (07/01 0517) BP: (104-122)/(53-81) 115/81 (07/01 0517) SpO2:  [94 %-97 %] 94 % (07/01 0517) Physical Exam: General: Patient is lying in bed and appears content. Cardiovascular: regular rate and rhythm no murmurs appreciated. Respiratory: No increased work of breathing, Equal sounds bilaterally Abdomen:Diffuse tenderness to palpation worse in the RUQ to palpation.  Extremities: No edema   Laboratory: Recent Labs  Lab 08/03/19 1518 08/03/19 1518 08/03/19 2037 08/05/19 0503 08/06/19 0033  WBC  16.8*  --   --  11.8* 12.1*  HGB  15.8   < > 17.3* 14.9 13.0  HCT 46.2   < > 51.0 45.0 38.0*  PLT 186  --   --  134* 147*   < > = values in this interval not displayed.   Recent Labs  Lab 08/03/19 1518 08/03/19 2037 08/04/19 0627 08/04/19 1117 08/04/19 2253 08/05/19 0503 08/06/19 0033  NA 133*   < > 135   < > 131* 129* 129*  K 4.5   < > 4.2   < > 4.0 4.2 3.3*  CL 97*   < > 103   < > 101 99 99  CO2 18*   < > 19*   < > 20* 19* 21*  BUN 12   < > 17   < > 21* 22* 20  CREATININE 1.16   < > 1.12   < > 0.98 1.07 0.85  CALCIUM 9.7   < > 9.0   < > 8.2* 8.1* 8.6*  PROT 7.1  --  6.8  --   --   --   --   BILITOT 2.1*  --  1.4*  --   --   --   --   ALKPHOS 87  --  62  --   --   --   --   ALT 106*  --  71*  --   --   --   --   AST 59*  --  42*  --   --   --   --   GLUCOSE 362*   < > 195*   < > 275* 288* 269*   < > = values in this interval not displayed.      Imaging/Diagnostic Tests:   Freida Busman, MD 08/06/2019, 6:33 AM PGY-1, [place] Intern pager: [phone], text pages welcome

## 2019-08-06 NOTE — Hospital Course (Addendum)
Eldo Umanzor is a 54 y.o. male that presented with abdominal pain found to have acute pancreatitis and DKA. PMH is significant for gout, hyperlipidemia, type 2 diabetes, hypertension. His hospital course is briefly outlined below. Please refer to H&P for details.   Abdominal Pain likely Secondary to Acute Pancreatitis Patient presented with 2 days of constant abdominal pain located in the central abdomen/epigastric region which worsened. ED work up s/f lipase 2307, AST/ALT 59/106, bili 2.1. WBC 16.8. Acute pancreatitis, moderate in severity, diffuse hepatic steatosis, and small volume ascites. RUQ scan w/o gallstones or biliary dilatation. Total chol 189, HDL 43, triglycerides of 554 and LDL unable to calculate due to elevated triglycerides.    DKA/Type 2 diabetes

## 2019-08-06 NOTE — Progress Notes (Signed)
Inpatient Diabetes Program Recommendations  AACE/ADA: New Consensus Statement on Inpatient Glycemic Control  Target Ranges:  Prepandial:   less than 140 mg/dL      Peak postprandial:   less than 180 mg/dL (1-2 hours)      Critically ill patients:  140 - 180 mg/dL   Results for Riley Wiggins, Riley Wiggins (MRN [id number]) as of 08/06/2019 10:46  Ref. Range 08/05/2019 07:27 08/05/2019 11:43 08/05/2019 11:45 08/05/2019 16:33 08/05/2019 21:25 08/06/2019 07:24  Glucose-Capillary Latest Ref Range: 70 - 99 mg/dL 281 (H) 367 (H) 345 (H) 348 (H) 265 (H) 313 (H)   Review of Glycemic Control  Diabetes history: DM2 Outpatient Diabetes medications: Metformin 500 mg BID Current orders for Inpatient glycemic control: Lantus 30 units daily, Novolog 0-9 units TID with meals, Metformin 500 mg BID  Inpatient Diabetes Program Recommendations:   Correction (SSI): Please consider increasing Novolog correction to moderate scale (0-15 units) TID and adding Novolog 0-5 units QHS for bedtime correction.  Insulin - Meal Coverage: Please consider ordering Novolog 10 units TID wtih meals for meal coverage if patient eats at least 50% of meals.  HbgA1C:  A1C 10.2% on 08/04/19 indicating an average glucose of 246 mg/dl over the past 2-3 months. 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 Patient has no insurance and will need affordable DM medications or may need medication assistance. May want to consider increasing Metformin 1000 mg BID and Glipizide 10 mg BID.  Thanks, Barnie Alderman, RN, MSN, CDE Diabetes Coordinator Inpatient Diabetes Program [phone] (Team Pager from 8am to 5pm)

## 2019-08-09 LAB — CULTURE, BLOOD (ROUTINE X 2)
Culture: NO GROWTH
Culture: NO GROWTH
Special Requests: ADEQUATE
Special Requests: ADEQUATE

## 2019-08-14 ENCOUNTER — Encounter: Payer: Self-pay | Admitting: Family Medicine

## 2019-08-14 ENCOUNTER — Ambulatory Visit (INDEPENDENT_AMBULATORY_CARE_PROVIDER_SITE_OTHER): Payer: Self-pay | Admitting: Family Medicine

## 2019-08-14 ENCOUNTER — Other Ambulatory Visit: Payer: Self-pay

## 2019-08-14 VITALS — BP 96/60 | HR 89 | Ht 74.0 in | Wt 264.1 lb

## 2019-08-14 DIAGNOSIS — E1165 Type 2 diabetes mellitus with hyperglycemia: Secondary | ICD-10-CM

## 2019-08-14 DIAGNOSIS — I10 Essential (primary) hypertension: Secondary | ICD-10-CM

## 2019-08-14 DIAGNOSIS — E1110 Type 2 diabetes mellitus with ketoacidosis without coma: Secondary | ICD-10-CM

## 2019-08-14 DIAGNOSIS — K8500 Idiopathic acute pancreatitis without necrosis or infection: Secondary | ICD-10-CM

## 2019-08-14 LAB — POCT CBG (FASTING - GLUCOSE)-MANUAL ENTRY: Glucose Fasting, POC: 372 mg/dL — AB (ref 70–99)

## 2019-08-14 MED ORDER — GLIMEPIRIDE 2 MG PO TABS: 2.0000 mg | ORAL_TABLET | Freq: Every day | ORAL | 0 refills | Status: DC

## 2019-08-14 MED ORDER — GLIMEPIRIDE 1 MG PO TABS: 1.0000 mg | ORAL_TABLET | Freq: Every day | ORAL | 0 refills | Status: DC

## 2019-08-14 MED ORDER — LOSARTAN POTASSIUM 25 MG PO TABS: 25.0000 mg | ORAL_TABLET | Freq: Every day | ORAL | 3 refills | Status: DC

## 2019-08-14 MED FILL — LOSARTAN POTASSIUM 25 MG TA: 25 | 90 days supply | Qty: 90 | Fill #0

## 2019-08-14 MED FILL — GLIMEPIRIDE 2 MG TABLET: 2 | 30 days supply | Qty: 30 | Fill #0

## 2019-08-14 NOTE — Patient Instructions (Signed)
It was great seeing you today!  Please check-out at the front desk before leaving the clinic. I'd like to see you back in 1 months but if you need to be seen earlier than that for any new issues we're happy to fit you in, just give [place] a call!  Visit Remembers: - Stop by the pharmacy to pick up your prescriptions: [place] to work on your healthy eating habits and incorporating exercise into your daily life.  - Your goal is to have an BP <120/80 - Medicine Changes: decrease your losartan by half, take 1/2 a tablet until you refill this medication (25 mg daily)  - To Do: keep track of your blood sugars   Regarding lab work today:  Due to recent changes in healthcare laws, you may see the results of your imaging and laboratory studies on MyChart before your provider has had a chance to review them.  I understand that in some cases there may be results that are confusing or concerning to you. Not all laboratory results come back in the same time frame and you may be waiting for multiple results in order to interpret others.  Please give [place] 72 hours in order for your provider to thoroughly review all the results before contacting the office for clarification of your results. If everything is normal, you will get a letter in the mail or a message in My Chart. Please give [place] a call if you do not hear from [place] after 2 weeks.  Please bring all of your medications with you to each visit.    If you haven't already, sign up for My Chart to have easy access to your labs results, and communication with your primary care physician.  Feel free to call with any questions or concerns at any time, at [phone].   Take care,  Dr. Rushie [facility]

## 2019-08-14 NOTE — Progress Notes (Signed)
New Patient Office Visit  Subjective:  Patient ID: Riley Wiggins, male    DOB: [birth year]  Age: 54 y.o. MRN: [id number]  CC:  Chief Complaint  Patient presents with  . Follow-up    hospital     HPI Riley Wiggins presents for hospital follow up. Patient is new to [facility] clinic.  Patient would like to continue his care at [facility].  He just moved to the area.   Pancreatitis Patient admitted for idiopathic acute pancreatitis.  Per pt, abdominal pain has resolved.  Is able to eat and drink fine.  Feels as if his pancreatitis has resolved.  Does not have nausea or vomiting.  Diabetes Mellitus  Has been trying to watch what he eats.  Denies missing any doses of DM medications and takes Metformin. Denies increased thirst, hunger, or frequent urination.  Home blood sugars have been ranging between 270 and 430.  Was around 320 this morning.  Patient believes his meter is not working properly as his numbers are more high.  He works as a Administrator and wants to avoid insulin.    HTN Takes losartan.  Denies missing doses of antihypertensive medications.  Endorses lightheadedness.  Denies chest pain, palpitations, lower extremity edema, exertional dyspnea, headaches and vision changes.      Past Medical History:  Diagnosis Date  . Diabetes mellitus without complication ([place])   . Dyslipidemia   . Hypertension     No past surgical history on file.  Family History  Problem Relation Age of Onset  . Pancreatitis Neg Hx     Social History   Socioeconomic History  . Marital status: Single    Spouse name: Not on file  . Number of children: Not on file  . Years of education: Not on file  . Highest education level: Not on file  Occupational History  . Not on file  Tobacco Use  . Smoking status: Current Every Day Smoker    Types: Cigarettes  . Smokeless tobacco: Never Used  Substance and Sexual Activity  . Alcohol use: Yes  . Drug use: No  . Sexual activity: Not on file  Other Topics  Concern  . Not on file  Social History Narrative   Works at truckers   Lives in [place] with girlfriend    Social Determinants of Health   Financial Resource Strain:   . Difficulty of Paying Living Expenses:   Food Insecurity:   . Worried About Charity fundraiser in the Last Year:   . Arboriculturist in the Last Year:   Transportation Needs:   . Film/video editor (Medical):   Marland Kitchen Lack of Transportation (Non-Medical):   Physical Activity:   . Days of Exercise per Week:   . Minutes of Exercise per Session:   Stress:   . Feeling of Stress :   Social Connections:   . Frequency of Communication with Friends and Family:   . Frequency of Social Gatherings with Friends and Family:   . Attends Religious Services:   . Active Member of Clubs or Organizations:   . Attends Archivist Meetings:   Marland Kitchen Marital Status:   Intimate Partner Violence:   . Fear of Current or Ex-Partner:   . Emotionally Abused:   Marland Kitchen Physically Abused:   . Sexually Abused:     ROS Review of Systems  Constitutional: Negative for fever.  HENT: Negative for sore throat.   Eyes: Negative for visual disturbance.  Respiratory: Negative  for cough and shortness of breath.   Cardiovascular: Negative for chest pain and leg swelling.  Gastrointestinal: Negative for abdominal pain, nausea and vomiting.  Endocrine: Negative for polydipsia, polyphagia and polyuria.  Genitourinary: Negative for dysuria.  Skin: Negative for rash.  Neurological: Positive for light-headedness. Negative for weakness and headaches.  All other systems reviewed and are negative.   Objective:   Today's Vitals: BP 96/60   Pulse 89   Ht 6\' 2"  (1.88 m)   Wt 264 lb 2 oz (119.8 kg)   SpO2 98%   BMI 33.91 kg/m   Physical Exam Vitals reviewed.  Constitutional:      General: He is not in acute distress.    Appearance: Normal appearance. He is obese. He is not ill-appearing.  HENT:     Head: Normocephalic and atraumatic.       Right Ear: External ear normal.     Left Ear: External ear normal.     Nose: Nose normal. No congestion.     Mouth/Throat:     Mouth: Mucous membranes are moist.     Pharynx: Oropharynx is clear. No oropharyngeal exudate or posterior oropharyngeal erythema.  Eyes:     General: No scleral icterus.    Extraocular Movements: Extraocular movements intact.     Conjunctiva/sclera: Conjunctivae normal.     Pupils: Pupils are equal, round, and reactive to light.  Cardiovascular:     Pulses: Normal pulses.     Heart sounds: Normal heart sounds. No murmur heard.   Pulmonary:     Effort: Pulmonary effort is normal. No respiratory distress.     Breath sounds: Normal breath sounds.  Abdominal:     General: Bowel sounds are normal. There is no distension.     Palpations: Abdomen is soft. There is no mass.     Tenderness: There is no abdominal tenderness. There is no guarding or rebound.  Skin:    General: Skin is warm and dry.     Capillary Refill: Capillary refill takes less than 2 seconds.  Neurological:     Mental Status: He is alert and oriented to person, place, and time. Mental status is at baseline.  Psychiatric:        Mood and Affect: Mood normal.        Behavior: Behavior normal.     Assessment & Plan:   Problem List Items Addressed This Visit      Cardiovascular and Mediastinum   Essential hypertension    Decrease losartan from 100 mg to 50 mg daily.  Patient reported experiencing some lightheadedness his blood pressure 92/60 today.  If this continues consider removing losartan.      Relevant Medications   losartan (COZAAR) 25 MG tablet     Digestive   Idiopathic acute pancreatitis - Primary    Obtain CMP.      Relevant Orders   Comprehensive metabolic panel (Completed)     Endocrine   Diabetic ketoacidosis without coma associated with type 2 diabetes mellitus ([place])    Would like for patient to be on SGLT2 versus GLP 1 however due to cost issues and lack of  insurance will try to gain better control patient's blood sugars with glyburide 2 mg daily.   A1c elevated generation admission, 10.2. Encouraged continued diet rich in vegetables and complex carbs.  Heart healthy carb modified diet. Counseled on need to continue exercising.  -Continue Metformin -Start glimepiride, counseled patient on symptoms of hypoglycemic events.  -Follow-up in 1 month Statin  therapy: Lipitor 40 mg ACEi/ARB: Losartan 50 mg Foot exam: Completed today Urine microalbumine: At next visit Eye exam: Asked patient to schedule       Relevant Medications   losartan (COZAAR) 25 MG tablet   glimepiride (AMARYL) 2 MG tablet    Other Visit Diagnoses    Type 2 diabetes mellitus with hyperglycemia, without long-term current use of insulin ([facility])       Relevant Medications   losartan (COZAAR) 25 MG tablet   glimepiride (AMARYL) 2 MG tablet   Other Relevant Orders   Glucose (CBG), Fasting (Completed)      Outpatient Encounter Medications as of 08/14/2019  Medication Sig  . allopurinol (ZYLOPRIM) 100 MG tablet Take 200 mg by mouth daily.  Marland Kitchen aspirin 81 MG chewable tablet Chew 81 mg by mouth daily.  Marland Kitchen atorvastatin (LIPITOR) 40 MG tablet Take 1 tablet by mouth at bedtime.  Marland Kitchen glimepiride (AMARYL) 2 MG tablet Take 1 tablet (2 mg total) by mouth daily with breakfast.  . losartan (COZAAR) 25 MG tablet Take 1 tablet (25 mg total) by mouth at bedtime.  . metFORMIN (GLUCOPHAGE-XR) 500 MG 24 hr tablet Take 1 tablet (500 mg total) by mouth daily with breakfast. Increase to 2 tablets in about 3-4 days  . traMADol (ULTRAM) 50 MG tablet Take 1 tablet (50 mg total) by mouth every 8 (eight) hours as needed for severe pain.  . [DISCONTINUED] glimepiride (AMARYL) 1 MG tablet Take 1 tablet (1 mg total) by mouth daily with breakfast.  . [DISCONTINUED] losartan (COZAAR) 50 MG tablet Take 1 tablet (50 mg total) by mouth daily.   No facility-administered encounter medications on file as of 08/14/2019.      Follow-up: Return in about 1 month (around 09/14/2019).   Lyndee Hensen, DO

## 2019-08-15 LAB — COMPREHENSIVE METABOLIC PANEL
ALT: 31 IU/L (ref 0–44)
AST: 16 IU/L (ref 0–40)
Albumin/Globulin Ratio: 1.4 (ref 1.2–2.2)
Albumin: 4.4 g/dL (ref 3.8–4.9)
Alkaline Phosphatase: 96 IU/L (ref 48–121)
BUN/Creatinine Ratio: 25 — ABNORMAL HIGH (ref 9–20)
BUN: 25 mg/dL — ABNORMAL HIGH (ref 6–24)
Bilirubin Total: 0.3 mg/dL (ref 0.0–1.2)
CO2: 18 mmol/L — ABNORMAL LOW (ref 20–29)
Calcium: 11.4 mg/dL — ABNORMAL HIGH (ref 8.7–10.2)
Chloride: 97 mmol/L (ref 96–106)
Creatinine, Ser: 1 mg/dL (ref 0.76–1.27)
GFR calc Af Amer: 98 mL/min/{1.73_m2} (ref >59)
GFR calc non Af Amer: 85 mL/min/{1.73_m2} (ref >59)
Globulin, Total: 3.1 g/dL (ref 1.5–4.5)
Glucose: 374 mg/dL — ABNORMAL HIGH (ref 65–99)
Potassium: 5 mmol/L (ref 3.5–5.2)
Sodium: 132 mmol/L — ABNORMAL LOW (ref 134–144)
Total Protein: 7.5 g/dL (ref 6.0–8.5)

## 2019-08-17 NOTE — Assessment & Plan Note (Signed)
Decrease losartan from 100 mg to 50 mg daily.  Patient reported experiencing some lightheadedness his blood pressure 92/60 today.  If this continues consider removing losartan.

## 2019-08-17 NOTE — Assessment & Plan Note (Addendum)
Obtain CMP.

## 2019-08-17 NOTE — Assessment & Plan Note (Addendum)
Would like for patient to be on SGLT2 versus GLP 1 however due to cost issues and lack of insurance will try to gain better control patient's blood sugars with glyburide 2 mg daily.   A1c elevated generation admission, 10.2. Encouraged continued diet rich in vegetables and complex carbs.  Heart healthy carb modified diet. Counseled on need to continue exercising.  -Continue Metformin -Start glimepiride, counseled patient on symptoms of hypoglycemic events.  -Follow-up in 1 month Statin therapy: Lipitor 40 mg ACEi/ARB: Losartan 50 mg Foot exam: Completed today Urine microalbumine: At next visit Eye exam: Asked patient to schedule

## 2019-09-07 ENCOUNTER — Other Ambulatory Visit: Payer: Self-pay | Admitting: Family Medicine

## 2019-09-07 DIAGNOSIS — E1165 Type 2 diabetes mellitus with hyperglycemia: Secondary | ICD-10-CM

## 2019-09-07 MED FILL — GLIMEPIRIDE 2 MG TABLET: 2 | 30 days supply | Qty: 30 | Fill #0

## 2019-09-14 ENCOUNTER — Other Ambulatory Visit: Payer: Self-pay | Admitting: Family Medicine

## 2019-09-14 DIAGNOSIS — E1165 Type 2 diabetes mellitus with hyperglycemia: Secondary | ICD-10-CM

## 2019-09-16 ENCOUNTER — Encounter: Payer: Self-pay | Admitting: Nurse Practitioner

## 2019-09-16 ENCOUNTER — Other Ambulatory Visit: Payer: Self-pay | Admitting: Nurse Practitioner

## 2019-09-16 ENCOUNTER — Other Ambulatory Visit: Payer: Self-pay

## 2019-09-16 ENCOUNTER — Ambulatory Visit: Payer: Self-pay | Attending: Nurse Practitioner | Admitting: Nurse Practitioner

## 2019-09-16 VITALS — Ht 74.0 in | Wt 270.0 lb

## 2019-09-16 DIAGNOSIS — E785 Hyperlipidemia, unspecified: Secondary | ICD-10-CM

## 2019-09-16 DIAGNOSIS — D72829 Elevated white blood cell count, unspecified: Secondary | ICD-10-CM

## 2019-09-16 DIAGNOSIS — M1A9XX0 Chronic gout, unspecified, without tophus (tophi): Secondary | ICD-10-CM

## 2019-09-16 DIAGNOSIS — Z7689 Persons encountering health services in other specified circumstances: Secondary | ICD-10-CM

## 2019-09-16 DIAGNOSIS — E1165 Type 2 diabetes mellitus with hyperglycemia: Secondary | ICD-10-CM

## 2019-09-16 DIAGNOSIS — I10 Essential (primary) hypertension: Secondary | ICD-10-CM

## 2019-09-16 DIAGNOSIS — Z125 Encounter for screening for malignant neoplasm of prostate: Secondary | ICD-10-CM

## 2019-09-16 MED ORDER — METFORMIN HCL ER 500 MG PO TB24: 500.0000 mg | ORAL_TABLET | Freq: Three times a day (TID) | ORAL | 1 refills | Status: DC

## 2019-09-16 MED ORDER — TRUEPLUS LANCETS 28G MISC: 3 refills | Status: DC

## 2019-09-16 MED ORDER — LOSARTAN POTASSIUM 25 MG PO TABS: 25.0000 mg | ORAL_TABLET | Freq: Every day | ORAL | 3 refills | Status: DC

## 2019-09-16 MED ORDER — ATORVASTATIN CALCIUM 40 MG PO TABS: 40.0000 mg | ORAL_TABLET | Freq: Every day | ORAL | 1 refills | Status: DC

## 2019-09-16 MED ORDER — TRUE METRIX BLOOD GLUCOSE TEST VI STRP: ORAL_STRIP | 12 refills | Status: DC

## 2019-09-16 MED ORDER — GLIMEPIRIDE 2 MG PO TABS: 2.0000 mg | ORAL_TABLET | Freq: Every day | ORAL | 2 refills | Status: DC

## 2019-09-16 MED ORDER — TRUE METRIX METER W/DEVICE KIT: PACK | 0 refills | Status: DC

## 2019-09-16 MED ORDER — ALLOPURINOL 100 MG PO TABS: 100.0000 mg | ORAL_TABLET | Freq: Every day | ORAL | 1 refills | Status: DC

## 2019-09-16 MED FILL — ATORVASTATIN CALCIUM 40 MG: 40 | 30 days supply | Qty: 30 | Fill #0

## 2019-09-16 MED FILL — !TRUE METRIX BLOOD GLUCOSE: 1 days supply | Qty: 1 | Fill #0

## 2019-09-16 MED FILL — ALLOPURINOL 100 MG TABLET: 100 | 30 days supply | Qty: 30 | Fill #0

## 2019-09-16 MED FILL — TRUEplus LANCETS 28G MISC: 50 days supply | Qty: 100 | Fill #0

## 2019-09-16 MED FILL — TRUE METRIX TEST STRIP: 50 days supply | Qty: 100 | Fill #0

## 2019-09-16 MED FILL — metFORMIN HCL ER 500 MG TB2: 500 | 30 days supply | Qty: 90 | Fill #0

## 2019-09-16 NOTE — Progress Notes (Signed)
Virtual Visit via Telephone Note Due to national recommendations of social distancing due to [place] 19, telehealth visit is felt to be most appropriate for this patient at this time.  I discussed the limitations, risks, security and privacy concerns of performing an evaluation and management service by telephone and the availability of in person appointments. I also discussed with the patient that there may be a patient responsible charge related to this service. The patient expressed understanding and agreed to proceed.    I connected with Riley Wiggins on 09/16/19  at   8:50 AM EDT  EDT by telephone and verified that I am speaking with the correct person using two identifiers.   Consent I discussed the limitations, risks, security and privacy concerns of performing an evaluation and management service by telephone and the availability of in person appointments. I also discussed with the patient that there may be a patient responsible charge related to this service. The patient expressed understanding and agreed to proceed.   Location of Patient: Private Residence   Location of Provider: [place] participating in Telemedicine visit: [name] FNP-BC [place]    History of Present Illness: Telemedicine visit for: Establish Care  DM TYPE 2 Diagnosed over 20 years ago. Not well controlled. Taking metformin XR 500 mg TID although it was ordered BID, amaryl '2mg'$  daily. Fasting reading this morning: 181. He had fried chicken and mashed potatoes last night. He is not dietary adherent or exercise adherent.  Taking ARB and STATIN. LDL not at goal. Currently taking atorvastatin 40 mg daily. Blood pressure is well controlled. Taking losartan 25 mg daily.  Lab Results  Component Value Date   HGBA1C 10.2 (H) 08/04/2019   Lab Results  Component Value Date   CHOL 189 08/03/2019   Lab Results  Component Value Date   HDL 43  08/03/2019   Lab Results  Component Value Date   LDLCALC UNABLE TO CALCULATE IF TRIGLYCERIDE OVER 400 mg/dL 08/03/2019   Lab Results  Component Value Date   TRIG 554 (H) 08/03/2019   Lab Results  Component Value Date   CHOLHDL 4.4 08/03/2019   Lab Results  Component Value Date   LDLDIRECT 57.0 08/03/2019   BP Readings from Last 3 Encounters:  08/14/19 96/60  08/06/19 112/74  07/26/15 (!) 137/91     Past Medical History:  Diagnosis Date  . Diabetes mellitus without complication ([place])   . Dyslipidemia   . Hyperlipidemia   . Hypertension     Past Surgical History:  Procedure Laterality Date  . NO PAST SURGERIES      Family History  Problem Relation Age of Onset  . Diabetes Mother   . Diabetes Father   . Pancreatitis Neg Hx     Social History   Socioeconomic History  . Marital status: Single    Spouse name: Not on file  . Number of children: Not on file  . Years of education: Not on file  . Highest education level: Not on file  Occupational History  . Not on file  Tobacco Use  . Smoking status: Former Smoker    Types: Cigarettes  . Smokeless tobacco: Never Used  . Tobacco comment: Quit smoking 5 years ago  Substance and Sexual Activity  . Alcohol use: Not Currently    Comment: rare  . Drug use: No  . Sexual activity: Yes  Other Topics Concern  . Not on file  Social  History Narrative   Works at [employer]   Lives in [place] with girlfriend    Social Determinants of Radio broadcast assistant Strain:   . Difficulty of Paying Living Expenses:   Food Insecurity:   . Worried About Charity fundraiser in the Last Year:   . Arboriculturist in the Last Year:   Transportation Needs:   . Film/video editor (Medical):   Marland Kitchen Lack of Transportation (Non-Medical):   Physical Activity:   . Days of Exercise per Week:   . Minutes of Exercise per Session:   Stress:   . Feeling of Stress :   Social Connections:   . Frequency of Communication with  Friends and Family:   . Frequency of Social Gatherings with Friends and Family:   . Attends Religious Services:   . Active Member of Clubs or Organizations:   . Attends Archivist Meetings:   Marland Kitchen Marital Status:      Observations/Objective: Awake, alert and oriented x 3   ROS  Assessment and Plan: [place] was seen today for new patient (initial visit).  Diagnoses and all orders for this visit:  Encounter to establish care  Type 2 diabetes mellitus with hyperglycemia, without long-term current use of insulin ([place]) -     metFORMIN (GLUCOPHAGE-XR) 500 MG 24 hr tablet; Take 1 tablet (500 mg total) by mouth 3 (three) times daily. -     glimepiride (AMARYL) 2 MG tablet; Take 1 tablet (2 mg total) by mouth daily with breakfast. -     glucose blood (TRUE METRIX BLOOD GLUCOSE TEST) test strip; Use as instructed. Check blood glucose level by fingerstick twice per day. E11.65 -     TRUEplus Lancets 28G MISC; Use as instructed. Check blood glucose level by fingerstick twice per day. E11.65 -     Blood Glucose Monitoring Suppl (TRUE METRIX METER) w/Device KIT; Use as instructed. Check blood glucose level by fingerstick twice per day. E11.65 -     CMP14+EGFR; Future -     TSH; Future Continue blood sugar control as discussed in office today, low carbohydrate diet, and regular physical exercise as tolerated, 150 minutes per week (30 min each day, 5 days per week, or 50 min 3 days per week). Keep blood sugar logs with fasting goal of 90-130 mg/dl, post prandial (after you eat) less than 180.  For Hypoglycemia: BS <60 and Hyperglycemia BS >400; contact the clinic ASAP. Annual eye exams and foot exams are recommended.   Essential hypertension -     losartan (COZAAR) 25 MG tablet; Take 1 tablet (25 mg total) by mouth at bedtime. Continue all antihypertensives as prescribed.  Remember to bring in your blood pressure log with you for your follow up appointment.  DASH/Mediterranean Diets are  healthier choices for HTN.    Leukocytosis, unspecified type -     CBC with Differential; Future  Prostate cancer screening -     PSA; Future  Dyslipidemia, goal LDL below 70 -     atorvastatin (LIPITOR) 40 MG tablet; Take 1 tablet (40 mg total) by mouth daily. INSTRUCTIONS: Work on a low fat, heart healthy diet and participate in regular aerobic exercise program by working out at least 150 minutes per week; 5 days a week-30 minutes per day. Avoid red meat/beef/steak,  fried foods. junk foods, sodas, sugary drinks, unhealthy snacking, alcohol and smoking.  Drink at least 80 oz of water per day and monitor your carbohydrate intake daily.  Chronic gout without tophus, unspecified cause, unspecified site -     allopurinol (ZYLOPRIM) 100 MG tablet; Take 1 tablet (100 mg total) by mouth daily.     Follow Up Instructions Return in about 8 weeks (around 11/11/2019).     I discussed the assessment and treatment plan with the patient. The patient was provided an opportunity to ask questions and all were answered. The patient agreed with the plan and demonstrated an understanding of the instructions.   The patient was advised to call back or seek an in-person evaluation if the symptoms worsen or if the condition fails to improve as anticipated.  I provided 19 minutes of non-face-to-face time during this encounter including median intraservice time, reviewing previous notes, labs, imaging, medications and explaining diagnosis and management.  Gildardo Pounds, FNP-BC

## 2019-09-23 ENCOUNTER — Ambulatory Visit: Payer: Self-pay | Attending: Nurse Practitioner

## 2019-09-23 ENCOUNTER — Other Ambulatory Visit: Payer: Self-pay

## 2019-09-23 DIAGNOSIS — D72829 Elevated white blood cell count, unspecified: Secondary | ICD-10-CM

## 2019-09-23 DIAGNOSIS — Z125 Encounter for screening for malignant neoplasm of prostate: Secondary | ICD-10-CM

## 2019-09-23 DIAGNOSIS — E1165 Type 2 diabetes mellitus with hyperglycemia: Secondary | ICD-10-CM

## 2019-09-24 LAB — CBC WITH DIFFERENTIAL/PLATELET
Basophils Absolute: 0.1 10*3/uL (ref 0.0–0.2)
Basos: 1 %
EOS (ABSOLUTE): 0.2 10*3/uL (ref 0.0–0.4)
Eos: 2 %
Hematocrit: 42.3 % (ref 37.5–51.0)
Hemoglobin: 14.8 g/dL (ref 13.0–17.7)
Immature Grans (Abs): 0 10*3/uL (ref 0.0–0.1)
Immature Granulocytes: 0 %
Lymphocytes Absolute: 3.8 10*3/uL — ABNORMAL HIGH (ref 0.7–3.1)
Lymphs: 31 %
MCH: 33.7 pg — ABNORMAL HIGH (ref 26.6–33.0)
MCHC: 35 g/dL (ref 31.5–35.7)
MCV: 96 fL (ref 79–97)
Monocytes Absolute: 0.8 10*3/uL (ref 0.1–0.9)
Monocytes: 7 %
Neutrophils Absolute: 7.1 10*3/uL — ABNORMAL HIGH (ref 1.4–7.0)
Neutrophils: 59 %
Platelets: 290 10*3/uL (ref 150–450)
RBC: 4.39 x10E6/uL (ref 4.14–5.80)
RDW: 12.8 % (ref 11.6–15.4)
WBC: 12.1 10*3/uL — ABNORMAL HIGH (ref 3.4–10.8)

## 2019-09-24 LAB — CMP14+EGFR
ALT: 14 IU/L (ref 0–44)
AST: 12 IU/L (ref 0–40)
Albumin/Globulin Ratio: 1.8 (ref 1.2–2.2)
Albumin: 4.7 g/dL (ref 3.8–4.9)
Alkaline Phosphatase: 89 IU/L (ref 48–121)
BUN/Creatinine Ratio: 15 (ref 9–20)
BUN: 11 mg/dL (ref 6–24)
Bilirubin Total: 0.3 mg/dL (ref 0.0–1.2)
CO2: 22 mmol/L (ref 20–29)
Calcium: 11.1 mg/dL — ABNORMAL HIGH (ref 8.7–10.2)
Chloride: 101 mmol/L (ref 96–106)
Creatinine, Ser: 0.74 mg/dL — ABNORMAL LOW (ref 0.76–1.27)
GFR calc Af Amer: 121 mL/min/{1.73_m2} (ref >59)
GFR calc non Af Amer: 105 mL/min/{1.73_m2} (ref >59)
Globulin, Total: 2.6 g/dL (ref 1.5–4.5)
Glucose: 173 mg/dL — ABNORMAL HIGH (ref 65–99)
Potassium: 4.4 mmol/L (ref 3.5–5.2)
Sodium: 139 mmol/L (ref 134–144)
Total Protein: 7.3 g/dL (ref 6.0–8.5)

## 2019-09-24 LAB — TSH: TSH: 0.412 u[IU]/mL — ABNORMAL LOW (ref 0.450–4.500)

## 2019-09-24 LAB — PSA: Prostate Specific Ag, Serum: 0.8 ng/mL (ref 0.0–4.0)

## 2019-09-25 ENCOUNTER — Other Ambulatory Visit: Payer: Self-pay

## 2019-10-08 MED FILL — GLIMEPIRIDE 2 MG TABS: 2 | 30 days supply | Qty: 30 | Fill #0

## 2019-10-15 ENCOUNTER — Other Ambulatory Visit: Payer: Self-pay | Admitting: Nurse Practitioner

## 2019-10-15 DIAGNOSIS — E785 Hyperlipidemia, unspecified: Secondary | ICD-10-CM

## 2019-10-15 DIAGNOSIS — E1165 Type 2 diabetes mellitus with hyperglycemia: Secondary | ICD-10-CM

## 2019-10-15 MED FILL — GLIMEPIRIDE 2 MG TABS: 2 | 30 days supply | Qty: 30 | Fill #0

## 2019-10-15 MED FILL — ATORVASTATIN CALCIUM 40 MG: 40 | 30 days supply | Qty: 30 | Fill #1

## 2019-10-15 MED FILL — metFORMIN HCL ER 500 MG TB2: 500 | 30 days supply | Qty: 90 | Fill #1

## 2019-10-15 NOTE — Telephone Encounter (Signed)
Medication Refill - Medication: atorvastatin, glimepride, metformin   Has the patient contacted their pharmacy? Yes.    (Agent: If no, request that the patient contact the pharmacy for the refill.) (Agent: If yes, when and what did the pharmacy advise?)  Preferred Pharmacy (with phone number or street name):  [facility], [address]  [place] [address]  Phone: [phone] Fax: [phone]  Hours: Not open 24 hours     Agent: Please be advised that RX refills may take up to 3 business days. We ask that you follow-up with your pharmacy.

## 2019-10-28 ENCOUNTER — Other Ambulatory Visit: Payer: Self-pay

## 2019-10-28 ENCOUNTER — Ambulatory Visit: Payer: Self-pay | Attending: Nurse Practitioner

## 2019-10-28 DIAGNOSIS — E1165 Type 2 diabetes mellitus with hyperglycemia: Secondary | ICD-10-CM

## 2019-10-28 NOTE — Progress Notes (Unsigned)
cmp

## 2019-10-29 ENCOUNTER — Other Ambulatory Visit: Payer: Self-pay

## 2019-10-29 LAB — CMP14+EGFR
ALT: 11 IU/L (ref 0–44)
AST: 11 IU/L (ref 0–40)
Albumin/Globulin Ratio: 1.8 (ref 1.2–2.2)
Albumin: 4.4 g/dL (ref 3.8–4.9)
Alkaline Phosphatase: 93 IU/L (ref 44–121)
BUN/Creatinine Ratio: 17 (ref 9–20)
BUN: 13 mg/dL (ref 6–24)
Bilirubin Total: 0.3 mg/dL (ref 0.0–1.2)
CO2: 22 mmol/L (ref 20–29)
Calcium: 10.5 mg/dL — ABNORMAL HIGH (ref 8.7–10.2)
Chloride: 102 mmol/L (ref 96–106)
Creatinine, Ser: 0.77 mg/dL (ref 0.76–1.27)
GFR calc Af Amer: 119 mL/min/{1.73_m2} (ref >59)
GFR calc non Af Amer: 103 mL/min/{1.73_m2} (ref >59)
Globulin, Total: 2.5 g/dL (ref 1.5–4.5)
Glucose: 194 mg/dL — ABNORMAL HIGH (ref 65–99)
Potassium: 4.3 mmol/L (ref 3.5–5.2)
Sodium: 140 mmol/L (ref 134–144)
Total Protein: 6.9 g/dL (ref 6.0–8.5)

## 2019-10-29 LAB — TSH: TSH: 0.612 u[IU]/mL (ref 0.450–4.500)

## 2019-11-02 ENCOUNTER — Other Ambulatory Visit: Payer: Self-pay | Admitting: Nurse Practitioner

## 2019-11-02 DIAGNOSIS — E1165 Type 2 diabetes mellitus with hyperglycemia: Secondary | ICD-10-CM

## 2019-11-02 DIAGNOSIS — E785 Hyperlipidemia, unspecified: Secondary | ICD-10-CM

## 2019-11-02 NOTE — Telephone Encounter (Signed)
Copied from [place] [id number]. Topic: Quick Communication - Rx Refill/Question >> Nov 02, 2019  3:38 PM Mcneil, Ja-Kwan wrote: Medication: glucose blood (TRUE METRIX BLOOD GLUCOSE TEST) test strip, glimepiride (AMARYL) 2 MG tablet, and atorvastatin (LIPITOR) 40 MG tablet  Has the patient contacted their pharmacy? no  Preferred Pharmacy (with phone number or street name): [facility], [address] Phone: [phone] Fax: [phone]  Agent: Please be advised that RX refills may take up to 3 business days. We ask that you follow-up with your pharmacy.

## 2019-11-04 ENCOUNTER — Ambulatory Visit: Payer: Self-pay | Admitting: Nurse Practitioner

## 2019-11-04 ENCOUNTER — Encounter: Payer: Self-pay | Admitting: *Deleted

## 2019-11-05 ENCOUNTER — Other Ambulatory Visit: Payer: Self-pay | Admitting: Nurse Practitioner

## 2019-11-05 DIAGNOSIS — E1165 Type 2 diabetes mellitus with hyperglycemia: Secondary | ICD-10-CM

## 2019-11-06 MED FILL — metFORMIN HCL ER 500 MG TB2: 500 | 30 days supply | Qty: 90 | Fill #0

## 2019-11-16 ENCOUNTER — Other Ambulatory Visit: Payer: Self-pay | Admitting: Nurse Practitioner

## 2019-11-16 DIAGNOSIS — E1165 Type 2 diabetes mellitus with hyperglycemia: Secondary | ICD-10-CM

## 2019-11-16 DIAGNOSIS — E785 Hyperlipidemia, unspecified: Secondary | ICD-10-CM

## 2019-11-16 MED ORDER — GLIMEPIRIDE 2 MG PO TABS: 2.0000 mg | ORAL_TABLET | Freq: Every day | ORAL | 0 refills | Status: DC

## 2019-11-16 MED ORDER — TRUE METRIX BLOOD GLUCOSE TEST VI STRP: ORAL_STRIP | 12 refills | Status: DC

## 2019-11-16 MED FILL — TRUE METRIX TEST STRIP: 50 days supply | Qty: 100 | Fill #0

## 2019-11-16 MED FILL — GLIMEPIRIDE 2 MG TABS: 2 | 30 days supply | Qty: 30 | Fill #0

## 2019-11-16 NOTE — Telephone Encounter (Signed)
Requested Prescriptions  Pending Prescriptions Disp Refills  . atorvastatin (LIPITOR) 40 MG tablet 90 tablet 1    Sig: Take 1 tablet (40 mg total) by mouth daily.     Cardiovascular:  Antilipid - Statins Failed - 11/16/2019  9:15 AM      Failed - Triglycerides in normal range and within 360 days    Triglycerides  Date Value Ref Range Status  08/03/2019 554 (H) <150 mg/dL Final         Passed - Total Cholesterol in normal range and within 360 days    Cholesterol  Date Value Ref Range Status  08/03/2019 189 0 - 200 mg/dL Final         Passed - LDL in normal range and within 360 days    LDL Cholesterol  Date Value Ref Range Status  08/03/2019 UNABLE TO CALCULATE IF TRIGLYCERIDE OVER 400 mg/dL 0 - 99 mg/dL Final    Comment:           Total Cholesterol/HDL:CHD Risk Coronary Heart Disease Risk Table                     Men   Women  1/2 Average Risk   3.4   3.3  Average Risk       5.0   4.4  2 X Average Risk   9.6   7.1  3 X Average Risk  23.4   11.0        Use the calculated Patient Ratio above and the CHD Risk Table to determine the patient's CHD Risk.        ATP III CLASSIFICATION (LDL):  <100     mg/dL   Optimal  100-129  mg/dL   Near or Above                    Optimal  130-159  mg/dL   Borderline  160-189  mg/dL   High  >190     mg/dL   Very High Performed at [facility] [address] [place], [place] [address]    Direct LDL  Date Value Ref Range Status  08/03/2019 57.0 0 - 99 mg/dL Final    Comment:    Performed at [facility] [address], [place], [place] [address]         Passed - HDL in normal range and within 360 days    HDL  Date Value Ref Range Status  08/03/2019 43 >40 mg/dL Final         Passed - Patient is not pregnant      Passed - Valid encounter within last 12 months    Recent Outpatient Visits          2 months ago Encounter to establish care   [facility] [place], [place] W, NP              . glimepiride (AMARYL) 2 MG tablet 90 tablet 0    Sig: Take 1 tablet (2 mg total) by mouth daily with breakfast.     Endocrinology:  Diabetes - Sulfonylureas Failed - 11/16/2019  9:15 AM      Failed - HBA1C is between 0 and 7.9 and within 180 days    Hgb A1c MFr Bld  Date Value Ref Range Status  08/04/2019 10.2 (H) 4.8 - 5.6 % Final    Comment:    (NOTE) Pre diabetes:  5.7%-6.4%  Diabetes:              >6.4%  Glycemic control for   <7.0% adults with diabetes          Passed - Valid encounter within last 6 months    Recent Outpatient Visits          2 months ago Encounter to establish care   [place], Vernia Buff, NP             Signed Prescriptions Disp Refills   glucose blood (TRUE METRIX BLOOD GLUCOSE TEST) test strip 100 each 12    Sig: Use as instructed. Check blood glucose level by fingerstick twice per day. E11.65     Endocrinology: Diabetes - Testing Supplies Passed - 11/16/2019  9:15 AM      Passed - Valid encounter within last 12 months    Recent Outpatient Visits          2 months ago Encounter to establish care   [facility] [place], Vernia Buff, NP

## 2019-11-16 NOTE — Telephone Encounter (Signed)
Medication Refill - Medication: atorvastatin, glimepiride, glucose test strips  Has the patient contacted their pharmacy? Yes.   (Agent: If no, request that the patient contact the pharmacy for the refill.) (Agent: If yes, when and what did the pharmacy advise?)  Preferred Pharmacy (with phone number or street name):  [facility], [place] [address]  [place] [address]  Phone: [phone] Fax: [phone]  Hours: Not open 24 hours     Agent: Please be advised that RX refills may take up to 3 business days. We ask that you follow-up with your pharmacy.

## 2019-11-16 NOTE — Telephone Encounter (Signed)
Requested Prescriptions  Pending Prescriptions Disp Refills  . atorvastatin (LIPITOR) 40 MG tablet 90 tablet 1    Sig: Take 1 tablet (40 mg total) by mouth daily.     Cardiovascular:  Antilipid - Statins Failed - 11/16/2019  9:15 AM      Failed - Triglycerides in normal range and within 360 days    Triglycerides  Date Value Ref Range Status  08/03/2019 554 (H) <150 mg/dL Final         Passed - Total Cholesterol in normal range and within 360 days    Cholesterol  Date Value Ref Range Status  08/03/2019 189 0 - 200 mg/dL Final         Passed - LDL in normal range and within 360 days    LDL Cholesterol  Date Value Ref Range Status  08/03/2019 UNABLE TO CALCULATE IF TRIGLYCERIDE OVER 400 mg/dL 0 - 99 mg/dL Final    Comment:           Total Cholesterol/HDL:CHD Risk Coronary Heart Disease Risk Table                     Men   Women  1/2 Average Risk   3.4   3.3  Average Risk       5.0   4.4  2 X Average Risk   9.6   7.1  3 X Average Risk  23.4   11.0        Use the calculated Patient Ratio above and the CHD Risk Table to determine the patient's CHD Risk.        ATP III CLASSIFICATION (LDL):  <100     mg/dL   Optimal  100-129  mg/dL   Near or Above                    Optimal  130-159  mg/dL   Borderline  160-189  mg/dL   High  >190     mg/dL   Very High Performed at [facility] [address], [place], [place] [address]    Direct LDL  Date Value Ref Range Status  08/03/2019 57.0 0 - 99 mg/dL Final    Comment:    Performed at [facility] [address], [place], [place] [address]         Passed - HDL in normal range and within 360 days    HDL  Date Value Ref Range Status  08/03/2019 43 >40 mg/dL Final         Passed - Patient is not pregnant      Passed - Valid encounter within last 12 months    Recent Outpatient Visits          2 months ago Encounter to establish care   [facility] [place], [place] W, NP              . glimepiride (AMARYL) 2 MG tablet 30 tablet 2    Sig: Take 1 tablet (2 mg total) by mouth daily with breakfast.     Endocrinology:  Diabetes - Sulfonylureas Failed - 11/16/2019  9:15 AM      Failed - HBA1C is between 0 and 7.9 and within 180 days    Hgb A1c MFr Bld  Date Value Ref Range Status  08/04/2019 10.2 (H) 4.8 - 5.6 % Final    Comment:    (NOTE) Pre diabetes:  5.7%-6.4%  Diabetes:              >6.4%  Glycemic control for   <7.0% adults with diabetes          Passed - Valid encounter within last 6 months    Recent Outpatient Visits          2 months ago Encounter to establish care   [place], NP             . glucose blood (TRUE METRIX BLOOD GLUCOSE TEST) test strip 100 each 12    Sig: Use as instructed. Check blood glucose level by fingerstick twice per day. E11.65     Endocrinology: Diabetes - Testing Supplies Passed - 11/16/2019  9:15 AM      Passed - Valid encounter within last 12 months    Recent Outpatient Visits          2 months ago Encounter to establish care   [facility] [place], Vernia Buff, NP

## 2019-11-18 ENCOUNTER — Telehealth: Payer: Self-pay | Admitting: Nurse Practitioner

## 2019-11-18 MED FILL — ATORVASTATIN CALCIUM 40 MG: 40 | 30 days supply | Qty: 30 | Fill #2

## 2019-11-18 NOTE — Telephone Encounter (Signed)
Copied from [place] [id number]. Topic: General - Other >> Nov 18, 2019  9:31 AM Rainey Pines A wrote: Patient wants to speak with nurse on his atorvastatin medication request being denied. Patient feels his request isnt too soon due to him having one pill left. Please advise

## 2019-11-26 MED FILL — ATORVASTATIN CALCIUM 40 MG: 40 | 30 days supply | Qty: 30 | Fill #2

## 2019-11-26 NOTE — Telephone Encounter (Signed)
Spoke to pharmacy. Patient have 2 more refills. The pharmacy was able to start one of his refill today. Attempt to reach patient to inform. No answer and unable to LVM due to no mailbox option.

## 2019-12-24 MED FILL — GLIMEPIRIDE 2 MG TABS: 2 | 30 days supply | Qty: 30 | Fill #1

## 2019-12-24 MED FILL — metFORMIN HCL ER 500 MG TB2: 500 | 30 days supply | Qty: 90 | Fill #0

## 2019-12-24 MED FILL — ATORVASTATIN CALCIUM 40 MG: 40 | 30 days supply | Qty: 30 | Fill #3

## 2020-01-20 MED FILL — GLIMEPIRIDE 2 MG TABS: 2 | 30 days supply | Qty: 30 | Fill #2

## 2020-01-20 MED FILL — metFORMIN HCL ER 500 MG TB2: 500 | 30 days supply | Qty: 90 | Fill #1

## 2020-01-20 MED FILL — ATORVASTATIN CALCIUM 40 MG: 40 | 30 days supply | Qty: 30 | Fill #4

## 2020-01-20 MED FILL — ALLOPURINOL 100 MG TABLET: 100 | 30 days supply | Qty: 30 | Fill #1

## 2020-01-22 MED FILL — TRUE METRIX GLUCOSE TEST ST: 50 days supply | Qty: 100 | Fill #1

## 2020-02-03 ENCOUNTER — Other Ambulatory Visit: Payer: Self-pay

## 2020-02-03 ENCOUNTER — Ambulatory Visit (HOSPITAL_BASED_OUTPATIENT_CLINIC_OR_DEPARTMENT_OTHER): Payer: Self-pay | Admitting: Pharmacist

## 2020-02-03 ENCOUNTER — Other Ambulatory Visit: Payer: Self-pay | Admitting: Physician Assistant

## 2020-02-03 ENCOUNTER — Ambulatory Visit: Payer: Self-pay | Attending: Nurse Practitioner | Admitting: Physician Assistant

## 2020-02-03 ENCOUNTER — Encounter: Payer: Self-pay | Admitting: Physician Assistant

## 2020-02-03 VITALS — BP 148/89 | HR 101 | Temp 98.5°F | Resp 16 | Ht 74.0 in | Wt 274.0 lb

## 2020-02-03 DIAGNOSIS — I10 Essential (primary) hypertension: Secondary | ICD-10-CM

## 2020-02-03 DIAGNOSIS — E785 Hyperlipidemia, unspecified: Secondary | ICD-10-CM

## 2020-02-03 DIAGNOSIS — E1169 Type 2 diabetes mellitus with other specified complication: Secondary | ICD-10-CM | POA: Insufficient documentation

## 2020-02-03 DIAGNOSIS — E1165 Type 2 diabetes mellitus with hyperglycemia: Secondary | ICD-10-CM

## 2020-02-03 DIAGNOSIS — M1A9XX0 Chronic gout, unspecified, without tophus (tophi): Secondary | ICD-10-CM

## 2020-02-03 DIAGNOSIS — E119 Type 2 diabetes mellitus without complications: Secondary | ICD-10-CM | POA: Insufficient documentation

## 2020-02-03 LAB — POCT GLYCOSYLATED HEMOGLOBIN (HGB A1C): HbA1c, POC (controlled diabetic range): 13.1 % — AB (ref 0.0–7.0)

## 2020-02-03 LAB — GLUCOSE, POCT (MANUAL RESULT ENTRY): POC Glucose: 324 mg/dl — AB (ref 70–99)

## 2020-02-03 MED ORDER — GLIMEPIRIDE 2 MG PO TABS: 2.0000 mg | ORAL_TABLET | Freq: Every day | ORAL | 1 refills | Status: DC

## 2020-02-03 MED ORDER — ATORVASTATIN CALCIUM 40 MG PO TABS: 40.0000 mg | ORAL_TABLET | Freq: Every day | ORAL | 1 refills | Status: DC

## 2020-02-03 MED ORDER — LANTUS SOLOSTAR 100 UNIT/ML ~~LOC~~ SOPN: 12.0000 [IU] | PEN_INJECTOR | Freq: Every day | SUBCUTANEOUS | 99 refills | Status: DC

## 2020-02-03 MED ORDER — PEN NEEDLES 31G X 5 MM MISC
1.0000 | 1 refills | Status: DC
Start: 2020-02-03 — End: 2020-02-03

## 2020-02-03 MED ORDER — METFORMIN HCL ER 500 MG PO TB24: 500.0000 mg | ORAL_TABLET | Freq: Three times a day (TID) | ORAL | 1 refills | Status: DC

## 2020-02-03 MED ORDER — TRUEPLUS LANCETS 28G MISC: 1.0000 | Freq: Two times a day (BID) | 1 refills | Status: DC

## 2020-02-03 MED ORDER — PEN NEEDLES 31G X 5 MM MISC: 1.0000 | 1 refills | Status: AC

## 2020-02-03 MED ORDER — TRUE METRIX METER W/DEVICE KIT: 1.0000 | PACK | Freq: Two times a day (BID) | 0 refills | Status: DC

## 2020-02-03 MED ORDER — LOSARTAN POTASSIUM 50 MG PO TABS: 50.0000 mg | ORAL_TABLET | Freq: Every day | ORAL | 3 refills | Status: DC

## 2020-02-03 MED ORDER — TRUE METRIX BLOOD GLUCOSE TEST VI STRP: ORAL_STRIP | 12 refills | Status: DC

## 2020-02-03 MED ORDER — ALLOPURINOL 100 MG PO TABS: 100.0000 mg | ORAL_TABLET | Freq: Every day | ORAL | 1 refills | Status: DC

## 2020-02-03 MED FILL — TRUEplus LANCETS 28G MISC: 50 days supply | Qty: 100 | Fill #0

## 2020-02-03 MED FILL — !LANTUS SOLOSTAR 100UNITS/M: 100 | 25 days supply | Qty: 3 | Fill #0

## 2020-02-03 MED FILL — LOSARTAN POTASSIUM 50 MG TA: 50 | 30 days supply | Qty: 30 | Fill #0

## 2020-02-03 MED FILL — TRUE METRIX GLUCOSE TEST ST: 50 days supply | Qty: 100 | Fill #0

## 2020-02-03 MED FILL — TRUEPLUS PEN NDL 31GX3/16: 31G X 5 MM | 90 days supply | Qty: 100 | Fill #0

## 2020-02-03 MED FILL — TRUE METRIX BLOOD GLUCOSE M: W/DEVICE | 30 days supply | Qty: 1 | Fill #0

## 2020-02-03 NOTE — Progress Notes (Signed)
Riley Wiggins, is a 54 y.o. male  WER:[id number]  PPJ:[id number]  DOB - Aug 16, 1965  Subjective:  Chief Complaint and HPI: Riley Wiggins is a 54 y.o. male here today for med RF/DM and htn.    He is not compliant with checking his blood sugars or diabetic diet but he says he always takes his meds as prescribed.  He has lost his meter.  No vision changes  No CP/HA. Does not check blood pressure out of the office.    ROS:   Constitutional:  No f/c, No night sweats, No unexplained weight loss. EENT:  No vision changes, No blurry vision, No hearing changes. No mouth, throat, or ear problems.  Respiratory: No cough, No SOB Cardiac: No CP, no palpitations GI:  No abd pain, No N/V/D. GU: No Urinary s/sx Musculoskeletal: No joint pain Neuro: No headache, no dizziness, no motor weakness.  Skin: No rash Endocrine:  No polydipsia. No polyuria.  Psych: Denies SI/HI  No problems updated.  ALLERGIES: No Known Allergies  PAST MEDICAL HISTORY: Past Medical History:  Diagnosis Date  . Diabetes mellitus without complication ([place])   . Dyslipidemia   . Hyperlipidemia   . Hypertension     MEDICATIONS AT HOME: Prior to Admission medications   Medication Sig Start Date End Date Taking? Authorizing Provider  Blood Glucose Monitoring Suppl (TRUE METRIX METER) w/Device KIT 1 each by Does not apply route in the morning and at bedtime. 02/03/20  Yes Johnthomas Lader M, PA-C  insulin glargine (LANTUS SOLOSTAR) 100 UNIT/ML Solostar Pen Inject 12 Units into the skin daily. 02/03/20  Yes Spike Desilets M, PA-C  Insulin Pen Needle (PEN NEEDLES) 31G X 5 MM MISC 1 each by Does not apply route 1 day or 1 dose for 1 dose. 02/03/20 02/04/20 Yes Shelene Krage, Dionne Bucy, PA-C  losartan (COZAAR) 50 MG tablet Take 1 tablet (50 mg total) by mouth daily. 02/03/20  Yes Argentina Donovan, PA-C  TRUEplus Lancets 28G MISC 1 each by Does not apply route in the morning and at bedtime. 02/03/20  Yes Argentina Donovan, PA-C   allopurinol (ZYLOPRIM) 100 MG tablet Take 1 tablet (100 mg total) by mouth daily. 02/03/20 05/03/20  Argentina Donovan, PA-C  aspirin 81 MG chewable tablet Chew 81 mg by mouth daily.    [provider]  atorvastatin (LIPITOR) 40 MG tablet Take 1 tablet (40 mg total) by mouth daily. 02/03/20 05/03/20  Argentina Donovan, PA-C  glimepiride (AMARYL) 2 MG tablet Take 1 tablet (2 mg total) by mouth daily with breakfast. 02/03/20   Argentina Donovan, PA-C  glucose blood (TRUE METRIX BLOOD GLUCOSE TEST) test strip Use as instructed. Check blood glucose level by fingerstick twice per day. E11.65 02/03/20   Argentina Donovan, PA-C  metFORMIN (GLUCOPHAGE-XR) 500 MG 24 hr tablet Take 1 tablet (500 mg total) by mouth 3 (three) times daily. 02/03/20 03/04/20  Argentina Donovan, PA-C  traMADol (ULTRAM) 50 MG tablet Take 1 tablet (50 mg total) by mouth every 8 (eight) hours as needed for severe pain. Patient not taking: Reported on 09/16/2019 08/06/19 08/05/20  [name], DO     Objective:  EXAM:   Vitals:   02/03/20 1553  BP: (!) 148/89  Pulse: (!) 101  Resp: 16  Temp: 98.5 F (36.9 C)  SpO2: 96%  Weight: 274 lb (124.3 kg)  Height: $Remove'6\' 2"'IDJazAH$  (1.88 m)    General appearance : A&OX3. NAD. Non-toxic-appearing HEENT: Atraumatic and Normocephalic.  PERRLA.  EOM intact.  Neck: supple, no JVD. No cervical lymphadenopathy. No thyromegaly Chest/Lungs:  Breathing-non-labored, Good air entry bilaterally, breath sounds normal without rales, rhonchi, or wheezing  CVS: S1 S2 regular, no murmurs, gallops, rubs  Extremities: Bilateral Lower Ext shows no edema, both legs are warm to touch with = pulse throughout Neurology:  CN II-XII grossly intact, Non focal.   Psych:  TP linear. J/I WNL. Normal speech. Appropriate eye contact and affect.  Skin:  No Rash  Data Review Lab Results  Component Value Date   HGBA1C 13.1 (A) 02/03/2020   HGBA1C 10.2 (H) 08/04/2019     Assessment & Plan   1. Type 2  diabetes mellitus with hyperglycemia, without long-term current use of insulin ([facility]) Uncontrolled.  I had the pharm D student provide education for lantus.  Eliminate sugar and white carbohydrates from your diet.  Check blood sugars at least fasting and at bedtime - Glucose (CBG) - HgB A1c - Comprehensive metabolic panel - glimepiride (AMARYL) 2 MG tablet; Take 1 tablet (2 mg total) by mouth daily with breakfast.  Dispense: 90 tablet; Refill: 1 - metFORMIN (GLUCOPHAGE-XR) 500 MG 24 hr tablet; Take 1 tablet (500 mg total) by mouth 3 (three) times daily.  Dispense: 90 tablet; Refill: 1 - insulin glargine (LANTUS SOLOSTAR) 100 UNIT/ML Solostar Pen; Inject 12 Units into the skin daily.  Dispense: 15 mL; Refill: PRN - Insulin Pen Needle (PEN NEEDLES) 31G X 5 MM MISC; 1 each by Does not apply route 1 day or 1 dose for 1 dose.  Dispense: 100 each; Refill: 1 - Blood Glucose Monitoring Suppl (TRUE METRIX METER) w/Device KIT; 1 each by Does not apply route in the morning and at bedtime.  Dispense: 1 kit; Refill: 0 - TRUEplus Lancets 28G MISC; 1 each by Does not apply route in the morning and at bedtime.  Dispense: 100 each; Refill: 1 - glucose blood (TRUE METRIX BLOOD GLUCOSE TEST) test strip; Use as instructed. Check blood glucose level by fingerstick twice per day. E11.65  Dispense: 100 each; Refill: 12  2. Essential hypertension Uncontrolled-increase dose of losartan from 25 to 50 - Comprehensive metabolic panel - losartan (COZAAR) 50 MG tablet; Take 1 tablet (50 mg total) by mouth daily.  Dispense: 90 tablet; Refill: 3  3. Dyslipidemia, goal LDL below 70 - Lipid panel - atorvastatin (LIPITOR) 40 MG tablet; Take 1 tablet (40 mg total) by mouth daily.  Dispense: 90 tablet; Refill: 1  4. Chronic gout without tophus, unspecified cause, unspecified site - allopurinol (ZYLOPRIM) 100 MG tablet; Take 1 tablet (100 mg total) by mouth daily.  Dispense: 90 tablet; Refill: 1   Patient have been counseled  extensively about nutrition and exercise  Return for 2-3 weeks with Lurena Joiner for DM and htn; 3 months with [facility].  The patient was given clear instructions to go to ER or return to medical center if symptoms don't improve, worsen or new problems develop. The patient verbalized understanding. The patient was told to call to get lab results if they haven't heard anything in the next week.     Freeman Caldron, PA-C [facility] [place], [place]   02/03/2020, 4:09 PMPatient ID: Riley Wiggins, male   DOB: 01/25/1966, 54 y.o.   MRN: [id number]

## 2020-02-03 NOTE — Progress Notes (Signed)
Patient was educated on the use of the insulin glargine (Lantus). Demonstrated proper injection technique with teach back method. Reviewed necessary supplies and operation of Lantus Solostar pen. Patient was able to demonstrate use. All questions and concerns were addressed.  Fabio Neighbors, PharmD, BCPS PGY2 Ambulatory Care Resident [facility]  Pharmacy

## 2020-02-04 LAB — LIPID PANEL
Chol/HDL Ratio: 4.4 ratio (ref 0.0–5.0)
Cholesterol, Total: 145 mg/dL (ref 100–199)
HDL: 33 mg/dL — ABNORMAL LOW (ref >39)
LDL Chol Calc (NIH): 64 mg/dL (ref 0–99)
Triglycerides: 300 mg/dL — ABNORMAL HIGH (ref 0–149)
VLDL Cholesterol Cal: 48 mg/dL — ABNORMAL HIGH (ref 5–40)

## 2020-02-04 LAB — COMPREHENSIVE METABOLIC PANEL
ALT: 12 IU/L (ref 0–44)
AST: 11 IU/L (ref 0–40)
Albumin/Globulin Ratio: 1.8 (ref 1.2–2.2)
Albumin: 4.6 g/dL (ref 3.8–4.9)
Alkaline Phosphatase: 103 IU/L (ref 44–121)
BUN/Creatinine Ratio: 16 (ref 9–20)
BUN: 14 mg/dL (ref 6–24)
Bilirubin Total: 0.4 mg/dL (ref 0.0–1.2)
CO2: 24 mmol/L (ref 20–29)
Calcium: 10.4 mg/dL — ABNORMAL HIGH (ref 8.7–10.2)
Chloride: 102 mmol/L (ref 96–106)
Creatinine, Ser: 0.85 mg/dL (ref 0.76–1.27)
GFR calc Af Amer: 114 mL/min/{1.73_m2} (ref >59)
GFR calc non Af Amer: 99 mL/min/{1.73_m2} (ref >59)
Globulin, Total: 2.5 g/dL (ref 1.5–4.5)
Glucose: 314 mg/dL — ABNORMAL HIGH (ref 65–99)
Potassium: 4.3 mmol/L (ref 3.5–5.2)
Sodium: 140 mmol/L (ref 134–144)
Total Protein: 7.1 g/dL (ref 6.0–8.5)

## 2020-02-22 NOTE — Progress Notes (Signed)
S:     PCP: [name], NP PMH: HTN, T2DM, CAD, HLD  Patient arrives in good spirits. Presents for diabetes and hypertension evaluation, education, and management. Patient was referred and last seen by Levada Dy on 02/03/20 at which pharmacy educated the patient on the use of her Lantus pen. Pt was started on 12 units daily. Additionally, BP at that visit was elevated at 148/89 and losartan was increased from 25 mg to 50 mg daily.   Today, patient reports adherence with Lantus 12 units daily, glimepiride 2mg  daily, and metformin 500 mg XR TID. Reports checking BG twice daily with fasting BG of 151 this morning and BG 225 after dinner yesterday. Denies BG <70 and >250. Additionally, reports compliance with losartan daily for hypertension management. Does not check BP at home. Denies dizziness, headaches, blurred vision, and LE swelling. Discussed current diet and exercise regimen (see below).   Family/Social History: -Fhx: DM in mother and father -Tobacco: former smoker  Human resources officer affordability: dnbi  Medication adherence reported.   Current diabetes medications include: Lantus 12 units daily (AM), glimepiride 2 mg daily, metformin 500 mg XR TID Current hypertension medications include: losartan 50 mg daily Current hyperlipidemia medications include: atorvastatin 80 mg daily  Patient denies hypoglycemic events.  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 (2x/night - normal for pt) Patient denies neuropathy (nerve pain). Patient denies visual changes. Patient denies self foot exams.     O:  POCT: 143 (post-prandial)  Home fasting blood sugars: 151 2 hour post-meal/random blood sugars: 225  Home BP readings: not  checking   Lab Results  Component Value Date   HGBA1C 13.1 (A) 02/03/2020   Vitals:   02/24/20 1506  BP: 127/88  Pulse: 80    Lipid Panel     Component Value Date/Time   CHOL 145 02/03/2020 1616   TRIG 300 (H) 02/03/2020 1616   HDL 33 (L) 02/03/2020 1616   CHOLHDL 4.4 02/03/2020 1616   CHOLHDL 4.4 08/03/2019 2005   VLDL UNABLE TO CALCULATE IF TRIGLYCERIDE OVER 400 mg/dL 08/03/2019 2005   [place] 64 02/03/2020 1616   LDLDIRECT 57.0 08/03/2019 2005    Clinical Atherosclerotic Cardiovascular Disease (ASCVD): No  The 10-year ASCVD risk score Mikey Bussing DC Jr., et al., 2013) is: 31.1%   Values used to calculate the score:     Age: 55 years     Sex: Male     Is Non-Hispanic African American: Yes     Diabetic: Yes     Tobacco smoker: Yes     Systolic Blood Pressure: 245 mmHg     Is BP treated: Yes     HDL Cholesterol: 33 mg/dL     Total Cholesterol: 145 mg/dL    A/P: Diabetes longstanding currently uncontrolled, however, sugars are improving since adding Lantus. Patient is able to verbalize appropriate hypoglycemia management plan. Medication adherence appears optimal. Encouraged patient to aim for a diet full of vegetables, fruit and lean meats (chicken, [place], fish) and to limit salt intake by eating fresh or frozen vegetables (instead of canned), rinse canned vegetables prior to cooking and do not add any additional salt to meals. Encouraged patient to exercise 20-30 minutes daily with the goal of 150 minutes per week. Patient verbalized understanding. -Increased Lantus from 12 units to 16 units daily -Extensively  discussed pathophysiology of diabetes, recommended lifestyle interventions, dietary effects on blood sugar control -Counseled on s/sx of and management of hypoglycemia -Next A1C anticipated March 2022   ASCVD risk - primary prevention in patient with diabetes. Last LDL is controlled. ASCVD risk score is >20% - moderate or high intensity statin indicated. -Continued  atorvastatin 80 mg daily  Hypertension longstanding currently near goal. Blood pressure goal = 130/80 mmHg. Medication adherence appears optimal.  Unable to assess home BP, however encouraged patient to check BP at least once daily ~1 hour after taking medications. Patient verbalized understanding. Discussed diet and exercise as shown above.  -Continued losartan 50 mg daily  Written patient instructions provided.  Total time in face to face counseling 25 minutes.   Follow up Pharmacist Clinic Visit in 4 weeks.     Lorel Monaco, PharmD, [place] PGY2 Ambulatory Care Resident [place]

## 2020-02-23 MED FILL — GLIMEPIRIDE 2 MG TABS: 2 | 30 days supply | Qty: 30 | Fill #1

## 2020-02-23 MED FILL — metFORMIN HCL ER 500 MG TB2: 500 | 30 days supply | Qty: 90 | Fill #0

## 2020-02-23 MED FILL — !LANTUS SOLOSTAR 100UNITS/M: 100 | 25 days supply | Qty: 3 | Fill #1

## 2020-02-23 MED FILL — ATORVASTATIN CALCIUM 40 MG: 40 | 30 days supply | Qty: 30 | Fill #5

## 2020-02-24 ENCOUNTER — Other Ambulatory Visit: Payer: Self-pay

## 2020-02-24 ENCOUNTER — Ambulatory Visit: Payer: Self-pay | Attending: Nurse Practitioner | Admitting: Pharmacist

## 2020-02-24 VITALS — BP 127/88 | HR 80

## 2020-02-24 DIAGNOSIS — E1165 Type 2 diabetes mellitus with hyperglycemia: Secondary | ICD-10-CM

## 2020-02-24 LAB — GLUCOSE, POCT (MANUAL RESULT ENTRY): POC Glucose: 143 mg/dl — AB (ref 70–99)

## 2020-02-24 MED ORDER — LANTUS SOLOSTAR 100 UNIT/ML ~~LOC~~ SOPN: 16.0000 [IU] | PEN_INJECTOR | Freq: Every day | SUBCUTANEOUS | 99 refills | Status: DC

## 2020-02-24 MED FILL — LOSARTAN POTASSIUM 50 MG TA: 50 | 30 days supply | Qty: 30 | Fill #1

## 2020-02-25 ENCOUNTER — Encounter: Payer: Self-pay | Admitting: Pharmacist

## 2020-03-11 ENCOUNTER — Ambulatory Visit: Payer: Self-pay | Admitting: *Deleted

## 2020-03-11 NOTE — Telephone Encounter (Signed)
Pt's fiance, Christianne Dolin called stating for the last 9 days the pt has not been well; his temp was 98.1 on 0600 03/12/19; his temp was 103.1 on 03/10/20 at noon; she has been rotating ibuprofen 600 mg q 6 hrs and 1000 mg Tylenol 1 6-8 hrs; she called EMS 03/10/20 around [place], but the pt was not transported due to increased weight times; the pt occasionally coughs; he complains of constant pounding headache rated 1-3 out of 10, and abdominal pain; he has multiple episodes of vomited during this period; he did have an episode of vomiting on 03/11/20 and now he has dry heaves; he also has body aches; the pt had both COVID shots; he has not had his booster; she says the pt is lethargic and hard to wake up on occasion; he is also experiencing weakness; Ms Grayland Ormond also says his urine is dark yellow; recommendations made per nurse triage protocol; since EMS did not transport the pt last night she would like for him to be seen in the office;  the pt is seen by [name], [facility]; spoke with [facility] regarding scheduling the pt; Mallory states the pt should be evaluated in the ED; pt's fiance notified; she verbalized understanding; she declined this triage nurse calling EMS; the pt's fiance says she will call EMS.  Reason for Disposition . Patient sounds very sick or weak to the triager  (Exception: mild weakness and hasn't taken fever medicine)  Answer Assessment - Initial Assessment Questions 1. TEMPERATURE: "What is the most recent temperature?"  "How was it measured?"   98.1 on 03/11/20 at 0600; 103.1 on 03/10/20 at 1200 2. ONSET: "When did the fever start?"      9 days ago 3. SYMPTOMS: "Do you have any other symptoms besides the fever?"  (e.g., colds, headache, sore throat, earache, cough, rash, diarrhea, vomiting, abdominal pain)    Abdominal pain, headache, vomiting/dry heaves, body aches, dark yellow urine 4. CAUSE: If there are no symptoms, ask: "What do you think is causing the fever?"      Not sure 5. CONTACTS: "Does anyone else in the family have an infection?"    no 6. TREATMENT: "What have you done so far to treat this fever?" (e.g., medications)    Alternating ibuprofen and tylenol 7. IMMUNOCOMPROMISE: "Do you have of the following: diabetes, HIV positive, splenectomy, cancer chemotherapy, chronic steroid treatment, transplant patient, etc."     diabetic 8. PREGNANCY: "Is there any chance you are pregnant?" "When was your last menstrual period?"    n/a 9. TRAVEL: "Have you traveled out of the country in the last month?" (e.g., travel history, exposures)    no  Protocols used: FEVER-A-AH

## 2020-03-11 NOTE — Telephone Encounter (Signed)
Noted.  Agreed w/ patient going to emergency department to be evaluated.

## 2020-03-12 ENCOUNTER — Inpatient Hospital Stay (HOSPITAL_COMMUNITY)
Admission: EM | Admit: 2020-03-12 | Discharge: 2020-03-15 | DRG: 394 | Disposition: A | Discharge status: Home / Self Care | Payer: PRIVATE HEALTH INSURANCE | Attending: Student | Admitting: Student

## 2020-03-12 ENCOUNTER — Encounter (HOSPITAL_COMMUNITY): Payer: Self-pay

## 2020-03-12 ENCOUNTER — Other Ambulatory Visit: Payer: Self-pay

## 2020-03-12 ENCOUNTER — Emergency Department (HOSPITAL_COMMUNITY): Payer: PRIVATE HEALTH INSURANCE

## 2020-03-12 DIAGNOSIS — I10 Essential (primary) hypertension: Secondary | ICD-10-CM | POA: Diagnosis present

## 2020-03-12 DIAGNOSIS — I700 Atherosclerosis of aorta: Secondary | ICD-10-CM | POA: Diagnosis present

## 2020-03-12 DIAGNOSIS — Z7982 Long term (current) use of aspirin: Secondary | ICD-10-CM | POA: Diagnosis not present

## 2020-03-12 DIAGNOSIS — E119 Type 2 diabetes mellitus without complications: Secondary | ICD-10-CM | POA: Diagnosis present

## 2020-03-12 DIAGNOSIS — Z597 Insufficient social insurance and welfare support: Secondary | ICD-10-CM | POA: Diagnosis not present

## 2020-03-12 DIAGNOSIS — R6510 Systemic inflammatory response syndrome (SIRS) of non-infectious origin without acute organ dysfunction: Secondary | ICD-10-CM | POA: Diagnosis not present

## 2020-03-12 DIAGNOSIS — K55059 Acute (reversible) ischemia of intestine, part and extent unspecified: Principal | ICD-10-CM | POA: Diagnosis present

## 2020-03-12 DIAGNOSIS — Z794 Long term (current) use of insulin: Secondary | ICD-10-CM | POA: Diagnosis not present

## 2020-03-12 DIAGNOSIS — K55069 Acute infarction of intestine, part and extent unspecified: Secondary | ICD-10-CM | POA: Diagnosis not present

## 2020-03-12 DIAGNOSIS — Z79891 Long term (current) use of opiate analgesic: Secondary | ICD-10-CM | POA: Diagnosis not present

## 2020-03-12 DIAGNOSIS — D72829 Elevated white blood cell count, unspecified: Secondary | ICD-10-CM | POA: Diagnosis not present

## 2020-03-12 DIAGNOSIS — Z79899 Other long term (current) drug therapy: Secondary | ICD-10-CM

## 2020-03-12 DIAGNOSIS — E785 Hyperlipidemia, unspecified: Secondary | ICD-10-CM | POA: Diagnosis present

## 2020-03-12 DIAGNOSIS — I152 Hypertension secondary to endocrine disorders: Secondary | ICD-10-CM | POA: Diagnosis present

## 2020-03-12 DIAGNOSIS — E1165 Type 2 diabetes mellitus with hyperglycemia: Secondary | ICD-10-CM | POA: Diagnosis present

## 2020-03-12 DIAGNOSIS — Z833 Family history of diabetes mellitus: Secondary | ICD-10-CM

## 2020-03-12 DIAGNOSIS — E669 Obesity, unspecified: Secondary | ICD-10-CM | POA: Diagnosis present

## 2020-03-12 DIAGNOSIS — E781 Pure hyperglyceridemia: Secondary | ICD-10-CM | POA: Diagnosis present

## 2020-03-12 DIAGNOSIS — K760 Fatty (change of) liver, not elsewhere classified: Secondary | ICD-10-CM | POA: Diagnosis present

## 2020-03-12 DIAGNOSIS — R590 Localized enlarged lymph nodes: Secondary | ICD-10-CM | POA: Diagnosis present

## 2020-03-12 DIAGNOSIS — D75839 Thrombocytosis, unspecified: Secondary | ICD-10-CM | POA: Diagnosis not present

## 2020-03-12 DIAGNOSIS — Z6833 Body mass index (BMI) 33.0-33.9, adult: Secondary | ICD-10-CM

## 2020-03-12 DIAGNOSIS — Z7984 Long term (current) use of oral hypoglycemic drugs: Secondary | ICD-10-CM

## 2020-03-12 DIAGNOSIS — Z20822 Contact with and (suspected) exposure to covid-19: Secondary | ICD-10-CM | POA: Diagnosis present

## 2020-03-12 DIAGNOSIS — Z87891 Personal history of nicotine dependence: Secondary | ICD-10-CM | POA: Diagnosis not present

## 2020-03-12 DIAGNOSIS — E1169 Type 2 diabetes mellitus with other specified complication: Secondary | ICD-10-CM | POA: Diagnosis present

## 2020-03-12 DIAGNOSIS — E1159 Type 2 diabetes mellitus with other circulatory complications: Secondary | ICD-10-CM | POA: Diagnosis present

## 2020-03-12 DIAGNOSIS — R7401 Elevation of levels of liver transaminase levels: Secondary | ICD-10-CM | POA: Diagnosis not present

## 2020-03-12 LAB — URINALYSIS, ROUTINE W REFLEX MICROSCOPIC
Bacteria, UA: NONE SEEN
Bilirubin Urine: NEGATIVE
Glucose, UA: NEGATIVE mg/dL
Hgb urine dipstick: NEGATIVE
Ketones, ur: NEGATIVE mg/dL
Leukocytes,Ua: NEGATIVE
Nitrite: NEGATIVE
Protein, ur: 30 mg/dL — AB
Specific Gravity, Urine: 1.013 (ref 1.005–1.030)
pH: 5 (ref 5.0–8.0)

## 2020-03-12 LAB — COMPREHENSIVE METABOLIC PANEL
ALT: 55 U/L — ABNORMAL HIGH (ref 0–44)
AST: 26 U/L (ref 15–41)
Albumin: 3.4 g/dL — ABNORMAL LOW (ref 3.5–5.0)
Alkaline Phosphatase: 137 U/L — ABNORMAL HIGH (ref 38–126)
Anion gap: 12 (ref 5–15)
BUN: 10 mg/dL (ref 6–20)
CO2: 24 mmol/L (ref 22–32)
Calcium: 9.6 mg/dL (ref 8.9–10.3)
Chloride: 99 mmol/L (ref 98–111)
Creatinine, Ser: 0.69 mg/dL (ref 0.61–1.24)
GFR, Estimated: >60 mL/min (ref >60)
Glucose, Bld: 220 mg/dL — ABNORMAL HIGH (ref 70–99)
Potassium: 4 mmol/L (ref 3.5–5.1)
Sodium: 135 mmol/L (ref 135–145)
Total Bilirubin: 0.8 mg/dL (ref 0.3–1.2)
Total Protein: 7.9 g/dL (ref 6.5–8.1)

## 2020-03-12 LAB — LIPASE, BLOOD: Lipase: 24 U/L (ref 11–51)

## 2020-03-12 LAB — CBG MONITORING, ED
Glucose-Capillary: 213 mg/dL — ABNORMAL HIGH (ref 70–99)
Glucose-Capillary: 149 mg/dL — ABNORMAL HIGH (ref 70–99)
Glucose-Capillary: 185 mg/dL — ABNORMAL HIGH (ref 70–99)

## 2020-03-12 LAB — CBC
HCT: 41 % (ref 39.0–52.0)
Hemoglobin: 13.8 g/dL (ref 13.0–17.0)
MCH: 31.4 pg (ref 26.0–34.0)
MCHC: 33.7 g/dL (ref 30.0–36.0)
MCV: 93.4 fL (ref 80.0–100.0)
Platelets: 406 10*3/uL — ABNORMAL HIGH (ref 150–400)
RBC: 4.39 MIL/uL (ref 4.22–5.81)
RDW: 14.2 % (ref 11.5–15.5)
WBC: 17.5 10*3/uL — ABNORMAL HIGH (ref 4.0–10.5)
nRBC: 0 % (ref 0.0–0.2)

## 2020-03-12 LAB — PROCALCITONIN: Procalcitonin: 1.8 ng/mL

## 2020-03-12 LAB — HEPARIN LEVEL (UNFRACTIONATED): Heparin Unfractionated: <0.1 IU/mL — ABNORMAL LOW (ref 0.30–0.70)

## 2020-03-12 LAB — LACTIC ACID, PLASMA: Lactic Acid, Venous: 1.5 mmol/L (ref 0.5–1.9)

## 2020-03-12 MED ORDER — PROCHLORPERAZINE EDISYLATE 10 MG/2ML IJ SOLN
10.0000 mg | Freq: Four times a day (QID) | INTRAMUSCULAR | Status: DC | PRN
  Filled 2020-03-12: qty 2

## 2020-03-12 MED ORDER — HEPARIN (PORCINE) 25000 UT/250ML-% IV SOLN
2700.0000 [IU]/h | INTRAVENOUS | Status: DC
  Administered 2020-03-12: 2000 [IU]/h via INTRAVENOUS
  Filled 2020-03-12 (×3): qty 250

## 2020-03-12 MED ORDER — SODIUM CHLORIDE 0.9 % IV SOLN: INTRAVENOUS | Status: DC

## 2020-03-12 MED ORDER — SODIUM CHLORIDE 0.9 % IV BOLUS
1000.0000 mL | Freq: Once | INTRAVENOUS | Status: AC
  Administered 2020-03-12: 1000 mL via INTRAVENOUS

## 2020-03-12 MED ORDER — IOHEXOL 300 MG/ML  SOLN
100.0000 mL | Freq: Once | INTRAMUSCULAR | Status: AC | PRN
  Administered 2020-03-12: 100 mL via INTRAVENOUS

## 2020-03-12 MED ORDER — MORPHINE SULFATE (PF) 4 MG/ML IV SOLN
4.0000 mg | Freq: Once | INTRAVENOUS | Status: AC
  Administered 2020-03-12: 4 mg via INTRAVENOUS
  Filled 2020-03-12: qty 1

## 2020-03-12 MED ORDER — ONDANSETRON HCL 4 MG/2ML IJ SOLN
4.0000 mg | Freq: Once | INTRAMUSCULAR | Status: AC
  Administered 2020-03-12: 4 mg via INTRAVENOUS
  Filled 2020-03-12: qty 2

## 2020-03-12 MED ORDER — HEPARIN BOLUS VIA INFUSION
5000.0000 [IU] | Freq: Once | INTRAVENOUS | Status: AC
  Administered 2020-03-12: 5000 [IU] via INTRAVENOUS
  Filled 2020-03-12: qty 5000

## 2020-03-12 MED ORDER — ACETAMINOPHEN 650 MG RE SUPP: 650.0000 mg | Freq: Four times a day (QID) | RECTAL | Status: DC | PRN

## 2020-03-12 MED ORDER — INSULIN ASPART 100 UNIT/ML ~~LOC~~ SOLN
0.0000 [IU] | Freq: Three times a day (TID) | SUBCUTANEOUS | Status: DC
  Administered 2020-03-13 (×2): 2 [IU] via SUBCUTANEOUS
  Administered 2020-03-13: 3 [IU] via SUBCUTANEOUS
  Administered 2020-03-14: 1 [IU] via SUBCUTANEOUS
  Administered 2020-03-14: 3 [IU] via SUBCUTANEOUS
  Filled 2020-03-12: qty 0.09

## 2020-03-12 MED ORDER — MORPHINE SULFATE (PF) 2 MG/ML IV SOLN
2.0000 mg | INTRAVENOUS | Status: DC | PRN
  Administered 2020-03-12 – 2020-03-14 (×4): 2 mg via INTRAVENOUS
  Filled 2020-03-12 (×4): qty 1

## 2020-03-12 MED ORDER — METOPROLOL TARTRATE 5 MG/5ML IV SOLN: 5.0000 mg | Freq: Four times a day (QID) | INTRAVENOUS | Status: DC | PRN

## 2020-03-12 MED ORDER — PIPERACILLIN-TAZOBACTAM 3.375 G IVPB
3.3750 g | Freq: Three times a day (TID) | INTRAVENOUS | Status: DC
  Administered 2020-03-12 – 2020-03-14 (×6): 3.375 g via INTRAVENOUS
  Filled 2020-03-12 (×6): qty 50

## 2020-03-12 MED ORDER — INSULIN ASPART 100 UNIT/ML ~~LOC~~ SOLN
0.0000 [IU] | Freq: Three times a day (TID) | SUBCUTANEOUS | Status: DC
  Filled 2020-03-12: qty 0.09

## 2020-03-12 MED ORDER — INSULIN GLARGINE 100 UNIT/ML ~~LOC~~ SOLN
8.0000 [IU] | Freq: Every day | SUBCUTANEOUS | Status: DC
  Administered 2020-03-12 – 2020-03-14 (×3): 8 [IU] via SUBCUTANEOUS
  Filled 2020-03-12 (×6): qty 0.08

## 2020-03-12 MED ORDER — ACETAMINOPHEN 325 MG PO TABS
650.0000 mg | ORAL_TABLET | Freq: Four times a day (QID) | ORAL | Status: DC | PRN
  Administered 2020-03-12 – 2020-03-14 (×2): 650 mg via ORAL
  Filled 2020-03-12 (×2): qty 2

## 2020-03-12 NOTE — Progress Notes (Signed)
ANTICOAGULATION CONSULT NOTE -  Consult  Pharmacy Consult for IV heparin  Indication: superior mesenteric vein thrombosis  No Known Allergies  Patient Measurements: Height: 6\' 3"  (190.5 cm) Weight: 122.5 kg (270 lb) IBW/kg (Calculated) : 84.5 Heparin Dosing Weight: 110.7 kg   Vital Signs: Temp: 98.8 F (37.1 C) (02/05 0753) Temp Source: Oral (02/05 0753) BP: 116/63 (02/05 1100) Pulse Rate: 78 (02/05 1100)  Labs: Recent Labs    03/12/20 0843  HGB 13.8  HCT 41.0  PLT 406*  CREATININE 0.69    Estimated Creatinine Clearance: 148.9 mL/min (by C-G formula based on SCr of 0.69 mg/dL).   Medical History: Past Medical History:  Diagnosis Date  . Diabetes mellitus without complication ([place])   . Dyslipidemia   . Hyperlipidemia   . 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 No anticoagulation on med rec.   Today, 03/12/20  SCr 0.69 mg/dl, CrCl > 100 ml/min   CBC WNL   Goal of Therapy:  Heparin level 0.3-0.7 units/ml Monitor platelets by anticoagulation protocol: Yes   Plan:   Heparin 5000 unit IV boilus followed by heparin 2000 units/hr   Obtain HL 6 hours after start of infusion   Daily CBC   Monitor for signs and symptoms of bleeding   Royetta Asal, PharmD, BCPS 03/12/2020 2:53 PM

## 2020-03-12 NOTE — H&P (Addendum)
History and Physical    [name] GEZ:[id number] DOB: February 15, 1965 DOA: 03/12/2020  PCP: Gildardo Pounds, NP  Patient coming from: Home  Chief Complaint: Abdominal pain.  HPI: Riley Wiggins is a 55 y.o. male with medical history significant of HTN, HLD, DM2. Presenting with abdomen pain that started 3 days ago. It's generalized episodic cramping. He tried APAP, advil, and heating pads to help. However, they provided no relief. He's had a decreased appetite during this time. He thought he may be having another attack of pancreatitis. When the pain did not improve this morning, he decided to come to the ED.    ED Course: CT ab/pelvis w/ contrast showed superior mesenteric vein thrombosis w/ surrounding inflammation and enlarged lymph nodes. EDP spoke with vascular surgery (Dr. Carlis Abbott). Recommended heparin gtt and transfer to [facility]. [facility] was called for admission.   Review of Systems:  Denies CP, palpitations, dyspnea, cough, fever, diarrhea, vomiting. He reports frontal headache, decreased appetite.  Review of systems is otherwise negative for all not mentioned in HPI.   PMHx Past Medical History:  Diagnosis Date  . Diabetes mellitus without complication ([place])   . Dyslipidemia   . Hyperlipidemia   . Hypertension     PSHx Past Surgical History:  Procedure Laterality Date  . NO PAST SURGERIES      SocHx  reports that he has quit smoking. His smoking use included cigarettes. He has never used smokeless tobacco. He reports previous alcohol use. He reports that he does not use drugs.  No Known Allergies  FamHx Family History  Problem Relation Age of Onset  . Diabetes Mother   . Diabetes Father   . Pancreatitis Neg Hx     Prior to Admission medications   Medication Sig Start Date End Date Taking? Authorizing Provider  acetaminophen (TYLENOL) 500 MG tablet Take 1,000 mg by mouth every 6 (six) hours as needed for mild pain.   Yes [provider]  allopurinol (ZYLOPRIM) 100 MG  tablet Take 1 tablet (100 mg total) by mouth daily. 02/03/20 05/03/20 Yes McClung, Dionne Bucy, PA-C  Ascorbic Acid (VITAMIN C) 1000 MG tablet Take 1,000 mg by mouth daily.   Yes [provider]  aspirin 81 MG chewable tablet Chew 81 mg by mouth daily.   Yes [provider]  atorvastatin (LIPITOR) 40 MG tablet Take 1 tablet (40 mg total) by mouth daily. 02/03/20 05/03/20 Yes McClung, Dionne Bucy, PA-C  Garlic 6503 MG CAPS Take 1,200 mg by mouth daily.   Yes [provider]  glimepiride (AMARYL) 2 MG tablet Take 1 tablet (2 mg total) by mouth daily with breakfast. 02/03/20  Yes McClung, Angela M, PA-C  ibuprofen (ADVIL) 200 MG tablet Take 600 mg by mouth every 6 (six) hours as needed for mild pain.   Yes [provider]  insulin glargine (LANTUS SOLOSTAR) 100 UNIT/ML Solostar Pen Inject 16 Units into the skin daily. 02/24/20  Yes Gildardo Pounds, NP  losartan (COZAAR) 50 MG tablet Take 1 tablet (50 mg total) by mouth daily. 02/03/20  Yes McClung, Angela M, PA-C  metFORMIN (GLUCOPHAGE-XR) 500 MG 24 hr tablet Take 1 tablet (500 mg total) by mouth 3 (three) times daily. 02/03/20 03/04/20 Yes McClung, Dionne Bucy, PA-C  Multiple Vitamins-Minerals (MULTI FOR HIM 50+) TABS Take 1 tablet by mouth daily.   Yes [provider]  Blood Glucose Monitoring Suppl (TRUE METRIX METER) w/Device KIT 1 each by Does not apply route in the morning and at bedtime.  02/03/20   Argentina Donovan, PA-C  glucose blood (TRUE METRIX BLOOD GLUCOSE TEST) test strip Use as instructed. Check blood glucose level by fingerstick twice per day. E11.65 02/03/20   Argentina Donovan, PA-C  traMADol (ULTRAM) 50 MG tablet Take 1 tablet (50 mg total) by mouth every 8 (eight) hours as needed for severe pain. Patient not taking: No sig reported 08/06/19 08/05/20  [name], DO  TRUEplus Lancets 28G MISC 1 each by Does not apply route in the morning and at bedtime. 02/03/20   Argentina Donovan, PA-C     Physical Exam: Vitals:   03/12/20 0855 03/12/20 0900 03/12/20 1000 03/12/20 1100  BP: 125/84 (!) 135/96 (!) 144/94 116/63  Pulse: 86 79 83 78  Resp: $Remo'17 18  17  'GJSyl$ Temp:      TempSrc:      SpO2: 95% 99% 94% 96%    General: 55 y.o. male resting in bed in NAD Eyes: PERRL, normal sclera ENMT: Nares patent w/o discharge, orophaynx clear, dentition normal, ears w/o discharge/lesions/ulcers Neck: Supple, trachea midline Cardiovascular: RRR, +S1, S2, no m/g/r, equal pulses throughout Respiratory: CTABL, no w/r/r, normal WOB GI: BS+, ND, TTP RUQ/LUQ w/o rebound, no masses noted, no organomegaly noted MSK: No e/c/c Skin: No rashes, bruises, ulcerations noted Neuro: A&O x 3, no focal deficits Psyc: Appropriate interaction and affect, calm/cooperative  Labs on Admission: I have personally reviewed following labs and imaging studies  CBC: Recent Labs  Lab 03/12/20 0843  WBC 17.5*  HGB 13.8  HCT 41.0  MCV 93.4  PLT 993*   Basic Metabolic Panel: Recent Labs  Lab 03/12/20 0843  NA 135  K 4.0  CL 99  CO2 24  GLUCOSE 220*  BUN 10  CREATININE 0.69  CALCIUM 9.6   GFR: CrCl cannot be calculated (Unknown ideal weight.). Liver Function Tests: Recent Labs  Lab 03/12/20 0843  AST 26  ALT 55*  ALKPHOS 137*  BILITOT 0.8  PROT 7.9  ALBUMIN 3.4*   Recent Labs  Lab 03/12/20 0843  LIPASE 24   No results for input(s): AMMONIA in the last 168 hours. Coagulation Profile: No results for input(s): INR, PROTIME in the last 168 hours. Cardiac Enzymes: No results for input(s): CKTOTAL, CKMB, CKMBINDEX, TROPONINI in the last 168 hours. BNP (last 3 results) No results for input(s): PROBNP in the last 8760 hours. HbA1C: No results for input(s): HGBA1C in the last 72 hours. CBG: Recent Labs  Lab 03/12/20 0850  GLUCAP 213*   Lipid Profile: No results for input(s): CHOL, HDL, LDLCALC, TRIG, CHOLHDL, LDLDIRECT in the last 72 hours. Thyroid Function Tests: No results for  input(s): TSH, T4TOTAL, FREET4, T3FREE, THYROIDAB in the last 72 hours. Anemia Panel: No results for input(s): VITAMINB12, FOLATE, FERRITIN, TIBC, IRON, RETICCTPCT in the last 72 hours. Urine analysis:    Component Value Date/Time   COLORURINE YELLOW 03/12/2020 [place] 03/12/2020 1134   LABSPEC 1.013 03/12/2020 1134   PHURINE 5.0 03/12/2020 [place] 03/12/2020 1134   [place] 03/12/2020 1134   [place] 03/12/2020 1134   [place] 03/12/2020 1134   PROTEINUR 30 (A) 03/12/2020 1134   NITRITE NEGATIVE 03/12/2020 1134   LEUKOCYTESUR NEGATIVE 03/12/2020 1134    Radiological Exams on Admission: CT ABDOMEN PELVIS W CONTRAST  Result Date: 03/12/2020 CLINICAL DATA:  Acute generalized abdominal pain. EXAM: CT ABDOMEN AND PELVIS WITH CONTRAST TECHNIQUE: Multidetector CT imaging of the abdomen and pelvis was performed using  the standard protocol following bolus administration of intravenous contrast. CONTRAST:  OMNIPAQUE IOHEXOL 300 MG/ML  SOLN COMPARISON:  August 03, 2019. FINDINGS: Lower chest: No acute abnormality. Hepatobiliary: No focal liver abnormality is seen. No gallstones, gallbladder wall thickening, or biliary dilatation. Pancreas: Unremarkable. No pancreatic ductal dilatation or surrounding inflammatory changes. Spleen: Normal in size without focal abnormality. Adrenals/Urinary Tract: Adrenal glands appear normal. Small nonobstructive right renal calculus is noted. No hydronephrosis or renal obstruction is noted. Urinary bladder is unremarkable. Stomach/Bowel: Stomach is within normal limits. Appendix appears normal. No evidence of bowel wall thickening, distention, or inflammatory changes. Vascular/Lymphatic: There appears to be acute thrombosis involving a branch of the superior mesenteric vein seen in the right lower quadrant, with surrounding inflammatory changes. There are enlarged mesenteric lymph nodes in this area as well.  Atherosclerosis of abdominal aorta is noted without aneurysm formation. Reproductive: Prostate is unremarkable. Other: No abdominal wall hernia or abnormality. No abdominopelvic ascites. Musculoskeletal: No acute or significant osseous findings. IMPRESSION: 1. There appears to be acute thrombosis involving a branch of the superior mesenteric vein seen in the right lower quadrant, with surrounding inflammatory changes. There are enlarged mesenteric lymph nodes in this area as well. Critical Value/emergent results were called by telephone at the time of interpretation on 03/12/2020 at 12:59 pm to provider [facility] , who verbally acknowledged these results. 2. Small nonobstructive right renal calculus. No hydronephrosis or renal obstruction is noted. 3. Aortic atherosclerosis. Aortic Atherosclerosis (ICD10-I70.0). Electronically Signed   By: Lupita Raider M.D.   On: 03/12/2020 12:59   US Abdomen Limited  Result Date: 03/12/2020 CLINICAL DATA:  Generalized abdominal pain for 3-4 days. EXAM: ULTRASOUND ABDOMEN LIMITED RIGHT UPPER QUADRANT COMPARISON:  September 02, 2019 FINDINGS: Gallbladder: No gallstones or wall thickening visualized. Layering gallbladder sludge. No sonographic Murphy sign noted by sonographer. Common bile duct: Diameter: 3 mm Liver: No focal lesion identified. Heterogeneous parenchymal echogenicity. Portal vein is patent on color Doppler imaging with normal direction of blood flow towards the liver. Other: None. IMPRESSION: Layering gallbladder sludge, without sonographic evidence of acute cholecystitis. Heterogeneous echotexture of the liver, usually seen with hepatic steatosis. Electronically Signed   By: Ted Mcalpine M.D.   On: 03/12/2020 11:23   Assessment/Plan Abdominal pain Superior mesenteric vein thrombosis     - admit to inpt, progressive at [facility]     - EDP spoke with vascular surgery (Dr. Chestine Spore), recommended transfer to [facility] and place on heparin gtt     - will add zosyn  empirically (inflammatory changes noted, elevated white count); check procal, lactic acid     - bowel rest for now     - fluids     - spoke with him about coumadin vs new agents; he wants some time to make a decision  Headache     - possible migraine?     - PRN compazine, follow  DM2     - SSI, glucose checks     - last A1c was 13.1  HTN     - PRN metoprolol     - resume home meds when taking PO  HLD     - resume home statin when taking PO  DVT prophylaxis: Heparin gtt  Code Status: FULL  Family Communication: With wife at bedside.  Consults called: EDP spoke with vascular surgeon.   Status is: Inpatient  Remains inpatient appropriate because:Inpatient level of care appropriate due to severity of illness   Dispo: The patient is from:  Home              Anticipated d/c is to: Home              Anticipated d/c date is: 2 days              Patient currently is not medically stable to d/c.   Difficult to place patient No  Jonnie Finner DO [facility]  If 7PM-7AM, please contact night-coverage [email or web address]  03/12/2020, 2:04 PM

## 2020-03-12 NOTE — Progress Notes (Addendum)
[place] for IV heparin  Indication: superior mesenteric vein thrombosis  No Known Allergies  Patient Measurements: Height: 6\' 3"  (190.5 cm) Weight: 122.5 kg (270 lb) IBW/kg (Calculated) : 84.5 Heparin Dosing Weight: 110.7 kg   Vital Signs: Temp: 100.7 F (38.2 C) (02/05 1930) Temp Source: Oral (02/05 1930) BP: 112/63 (02/05 2000) Pulse Rate: 94 (02/05 2000)  Labs: Recent Labs    03/12/20 0843 03/12/20 2100  HGB 13.8  --   HCT 41.0  --   PLT 406*  --   HEPARINUNFRC  --  <0.10*  CREATININE 0.69  --     Estimated Creatinine Clearance: 148.9 mL/min (by C-G formula based on SCr of 0.69 mg/dL).   Medical History: Past Medical History:  Diagnosis Date  . Diabetes mellitus without complication ([place])   . Dyslipidemia   . Hyperlipidemia   . 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 No anticoagulation on med rec.   Today, 03/12/20  Heparin level 0.17 - subtherapeutic on IV Heparin @ 2400 units/hr  CBC WNL   No bleeding noted per RN  Heparin has been infusing without interruptions per RN  Goal of Therapy:  Heparin level 0.3-0.7 units/ml Monitor platelets by anticoagulation protocol: Yes   Plan:   Rebolus Heparin 3000 unit IV x1 then increase heparin 2700 units/hr   Obtain HL 6 hours after rate increase   Daily CBC   Monitor for signs and symptoms of bleeding   Netta Cedars, PharmD, BCPS 03/12/2020 9:54 PM

## 2020-03-12 NOTE — ED Triage Notes (Signed)
Pt presents with c/o chills, headache, abdominal pain since Thursday. Pt reports he has also had a fever at home, highest at 103, which has been controlled with antipyretics. Pt also reports he has pancreatitis so is unsure if that is related to his stomach pain. Pt reports he is vaccinated fully against Covid-19.

## 2020-03-12 NOTE — ED Provider Notes (Signed)
[facility] DEPT Provider Note   CSN: [id number] Arrival date & time: 03/12/20  0746     History Chief Complaint  Patient presents with  . Abdominal Pain  . Headache  . Chills    Riley Wiggins is a 55 y.o. male.  The history is provided by the patient and medical records. No language interpreter was used.  Abdominal Pain Headache Associated symptoms: abdominal pain      56 year old male significant history of idiopathic pancreatitis, diabetes, hypertension, hyperlipidemia who has been fully vaccinated for COVID-19 presenting complaining of abdominal pain.  Patient reports 3 to 4 days ago he developed gradual onset of headache.  Described headache as a frontal headache, sharp throbbing waxing waning but has since improved.  For the past 2 days he endorsed having pain across his abdomen.  Described as a crampy burning sensation waxing waning 6 out of 10.  Endorsed decrease in appetite, did have some nausea and vomiting earlier that has since resolved.  Pain felt somewhat similar to prior pancreatitis that he has had in the past.  He denies having fever or chills no runny nose sneezing or coughing no neck stiffness no chest pain or shortness of breath no back pain dysuria hematuria.  He has been fully vaccinated for COVID-19.  He denies any specific treatment tried.  He denies alcohol abuse.  Past Medical History:  Diagnosis Date  . Diabetes mellitus without complication ([place])   . Dyslipidemia   . Hyperlipidemia   . Hypertension     Patient Active Problem List   Diagnosis Date Noted  . Type 2 diabetes mellitus with hyperglycemia, without long-term current use of insulin ([place]) 02/03/2020  . Essential hypertension 08/04/2019  . Aortic atherosclerosis ([place]) 08/04/2019  . Dyslipidemia 08/04/2019  . High anion gap metabolic acidosis 35/32/9924  . Hypertriglyceridemia 08/04/2019  . Idiopathic acute pancreatitis 08/03/2019  . Diabetic ketoacidosis without  coma associated with type 2 diabetes mellitus [facility])     Past Surgical History:  Procedure Laterality Date  . NO PAST SURGERIES         Family History  Problem Relation Age of Onset  . Diabetes Mother   . Diabetes Father   . Pancreatitis Neg Hx     Social History   Tobacco Use  . Smoking status: Former Smoker    Types: Cigarettes  . Smokeless tobacco: Never Used  . Tobacco comment: Quit smoking 5 years ago  Substance Use Topics  . Alcohol use: Not Currently    Comment: rare  . Drug use: No    Home Medications Prior to Admission medications   Medication Sig Start Date End Date Taking? Authorizing Provider  allopurinol (ZYLOPRIM) 100 MG tablet Take 1 tablet (100 mg total) by mouth daily. 02/03/20 05/03/20  Argentina Donovan, PA-C  aspirin 81 MG chewable tablet Chew 81 mg by mouth daily.    [provider]  atorvastatin (LIPITOR) 40 MG tablet Take 1 tablet (40 mg total) by mouth daily. 02/03/20 05/03/20  Argentina Donovan, PA-C  Blood Glucose Monitoring Suppl (TRUE METRIX METER) w/Device KIT 1 each by Does not apply route in the morning and at bedtime. 02/03/20   Argentina Donovan, PA-C  glimepiride (AMARYL) 2 MG tablet Take 1 tablet (2 mg total) by mouth daily with breakfast. 02/03/20   Argentina Donovan, PA-C  glucose blood (TRUE METRIX BLOOD GLUCOSE TEST) test strip Use as instructed. Check blood glucose level by fingerstick twice per day. E11.65 02/03/20  Freeman Caldron M, PA-C  insulin glargine (LANTUS SOLOSTAR) 100 UNIT/ML Solostar Pen Inject 16 Units into the skin daily. 02/24/20   Gildardo Pounds, NP  losartan (COZAAR) 50 MG tablet Take 1 tablet (50 mg total) by mouth daily. 02/03/20   Argentina Donovan, PA-C  metFORMIN (GLUCOPHAGE-XR) 500 MG 24 hr tablet Take 1 tablet (500 mg total) by mouth 3 (three) times daily. 02/03/20 03/04/20  Argentina Donovan, PA-C  traMADol (ULTRAM) 50 MG tablet Take 1 tablet (50 mg total) by mouth every 8 (eight) hours as needed for  severe pain. Patient not taking: Reported on 09/16/2019 08/06/19 08/05/20  [name], DO  TRUEplus Lancets 28G MISC 1 each by Does not apply route in the morning and at bedtime. 02/03/20   Argentina Donovan, PA-C    Allergies    Patient has no known allergies.  Review of Systems   Review of Systems  Gastrointestinal: Positive for abdominal pain.  Neurological: Positive for headaches.  All other systems reviewed and are negative.   Physical Exam Updated Vital Signs BP 115/84 (BP Location: Left Arm)   Pulse 96   Temp 98.8 F (37.1 C) (Oral)   Resp 18   SpO2 97%   Physical Exam Vitals and nursing note reviewed.  Constitutional:      General: He is not in acute distress.    Appearance: He is well-developed and well-nourished.  HENT:     Head: Atraumatic.  Eyes:     Conjunctiva/sclera: Conjunctivae normal.  Cardiovascular:     Rate and Rhythm: Normal rate and regular rhythm.  Pulmonary:     Effort: Pulmonary effort is normal.     Breath sounds: Normal breath sounds.  Abdominal:     General: Abdomen is flat.     Palpations: Abdomen is soft.     Tenderness: There is abdominal tenderness (Mild right upper quadrant left upper quadrant tenderness on palpation without guarding or rebound tenderness.  Negative Murphy sign, no pain at McBurney's point.).     Hernia: No hernia is present.  Musculoskeletal:     Cervical back: Neck supple.  Skin:    Findings: No rash.  Neurological:     Mental Status: He is alert and oriented to person, place, and time.  Psychiatric:        Mood and Affect: Mood and affect and mood normal.     ED Results / Procedures / Treatments   Labs (all labs ordered are listed, but only abnormal results are displayed) Labs Reviewed  COMPREHENSIVE METABOLIC PANEL - Abnormal; Notable for the following components:      Result Value   Glucose, Bld 220 (*)    Albumin 3.4 (*)    ALT 55 (*)    Alkaline Phosphatase 137 (*)    All other components  within normal limits  CBC - Abnormal; Notable for the following components:   WBC 17.5 (*)    Platelets 406 (*)    All other components within normal limits  URINALYSIS, ROUTINE W REFLEX MICROSCOPIC - Abnormal; Notable for the following components:   Protein, ur 30 (*)    All other components within normal limits  CBG MONITORING, ED - Abnormal; Notable for the following components:   Glucose-Capillary 213 (*)    All other components within normal limits  SARS CORONAVIRUS 2 (TAT 6-24 HRS)  LIPASE, BLOOD    EKG None  Radiology US Abdomen Limited  Result Date: 03/12/2020 CLINICAL DATA:  Generalized abdominal pain for  3-4 days. EXAM: ULTRASOUND ABDOMEN LIMITED RIGHT UPPER QUADRANT COMPARISON:  September 02, 2019 FINDINGS: Gallbladder: No gallstones or wall thickening visualized. Layering gallbladder sludge. No sonographic Murphy sign noted by sonographer. Common bile duct: Diameter: 3 mm Liver: No focal lesion identified. Heterogeneous parenchymal echogenicity. Portal vein is patent on color Doppler imaging with normal direction of blood flow towards the liver. Other: None. IMPRESSION: Layering gallbladder sludge, without sonographic evidence of acute cholecystitis. Heterogeneous echotexture of the liver, usually seen with hepatic steatosis. Electronically Signed   By: Fidela Salisbury M.D.   On: 03/12/2020 11:23    Procedures .Critical Care Performed by: Domenic Moras, PA-C Authorized by: Domenic Moras, PA-C   Critical care provider statement:    Critical care time (minutes):  37   Critical care was time spent personally by me on the following activities:  Discussions with consultants, evaluation of patient's response to treatment, examination of patient, ordering and performing treatments and interventions, ordering and review of laboratory studies, ordering and review of radiographic studies, pulse oximetry, re-evaluation of patient's condition, obtaining history from patient or surrogate and  review of old charts     Medications Ordered in ED Medications  morphine 4 MG/ML injection 4 mg (has no administration in time range)  morphine 4 MG/ML injection 4 mg (4 mg Intravenous Given 03/12/20 0851)  ondansetron (ZOFRAN) injection 4 mg (4 mg Intravenous Given 03/12/20 0851)  sodium chloride 0.9 % bolus 1,000 mL (0 mLs Intravenous Stopped 03/12/20 1153)    ED Course  I have reviewed the triage vital signs and the nursing notes.  Pertinent labs & imaging results that were available during my care of the patient were reviewed by me and considered in my medical decision making (see chart for details).    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  Has history of idiopathic pancreatitis in the past.  Also endorsing headache.  He is overall well-appearing, no nuchal rigidity and no concerning symptoms in regards to his headache.  He does have a mild tenderness to his upper abdomen but otherwise a fairly benign abdominal exam.  No chest pain shortness of breath and no Covid symptoms.  He is afebrile, vital signs stable.  Work-up initiated.  9:49 AM Labs remarkable for an elevated white count of 17.5.  Normal lipase, CBG 213.  Since patient has upper abdominal pain, will obtain limited ultrasound to assess for potential biliary disease causing his symptoms.  11:55 AM Limited abdominal ultrasound demonstrate layering gallbladder sludge without sonographic evidence of acute cholecystitis.  Evidence of hepatic steatosis.  Since patient still endorse abdominal pain, will obtain abdominal pelvis CT scan for further evaluation.  Additional pain medication ordered.  1:06 PM An abdominal  pelvis CT scan obtained demonstrate an acute thrombosis involving a branch of the superior mesenteric vein seen in the right lower quadrant with surrounding inflammatory changes.  There are also enlarged mesenteric lymph nodes in this area as well.    The area of the finding is consistent  with patient's presentation.  At this time patient is resting comfortably and more comfortable after receiving pain medication.  He denies any strong family history of coagulopathy.  He did report noticing discomfort when he first used insulin needle to inject himself but was unsure if this correlates to the finding.  I have low suspicion that this is related to insulin injection.  Plan to consult vascular surgeon for recommendation.   Care discussed with Dr. Langston Masker.   1:31 PM Appreciate consultation from vascular surgeon, Dr. Carlis Abbott, who recommend patient to be anticoagulated and to be transferred over to [facility] to be admitted by medicine.  He will be available for consultation  2:03 PM Appreciate consultation from [facility] Dr. Marylyn Ishihara who agrees to see and help admit pt to [facility] for further care.  I have ordered Heparin per pharmacy to dose.  Pt is aware of plan.  Suspect his finding is related to hx of pancreatitis.     Domenic Moras, PA-C 03/12/20 1405    Wyvonnia Dusky, MD 03/12/20 [phone]

## 2020-03-12 NOTE — Progress Notes (Signed)
Pharmacy Antibiotic Note  Riley Wiggins is a 55 y.o. male admitted on 03/12/2020 with [facility]AI.  Pharmacy has been consulted for Zosyn dosing.  Plan: Zosyn 3.375g IV q8h (4 hour infusion).    Dosage will likely remain stable at above dosage and need for further dosage adjustment appears unlikely at present.    Will sign off at this time.  Please reconsult if a change in clinical status warrants re-evaluation of dosage.     Height: 6\' 3"  (190.5 cm) Weight: 122.5 kg (270 lb) IBW/kg (Calculated) : 84.5  Temp (24hrs), Avg:98.8 F (37.1 C), Min:98.8 F (37.1 C), Max:98.8 F (37.1 C)  Recent Labs  Lab 03/12/20 0843  WBC 17.5*  CREATININE 0.69    Estimated Creatinine Clearance: 148.9 mL/min (by C-G formula based on SCr of 0.69 mg/dL).    No Known Allergies     Thank you for allowing pharmacy to be a part of this patient's care.   Royetta Asal, PharmD, BCPS 03/12/2020 4:24 PM

## 2020-03-13 DIAGNOSIS — K55069 Acute infarction of intestine, part and extent unspecified: Secondary | ICD-10-CM | POA: Diagnosis not present

## 2020-03-13 LAB — CBC
HCT: 37.8 % — ABNORMAL LOW (ref 39.0–52.0)
Hemoglobin: 12.7 g/dL — ABNORMAL LOW (ref 13.0–17.0)
MCH: 32 pg (ref 26.0–34.0)
MCHC: 33.6 g/dL (ref 30.0–36.0)
MCV: 95.2 fL (ref 80.0–100.0)
Platelets: 435 10*3/uL — ABNORMAL HIGH (ref 150–400)
RBC: 3.97 MIL/uL — ABNORMAL LOW (ref 4.22–5.81)
RDW: 14.6 % (ref 11.5–15.5)
WBC: 16.7 10*3/uL — ABNORMAL HIGH (ref 4.0–10.5)
nRBC: 0 % (ref 0.0–0.2)

## 2020-03-13 LAB — COMPREHENSIVE METABOLIC PANEL
ALT: 68 U/L — ABNORMAL HIGH (ref 0–44)
AST: 49 U/L — ABNORMAL HIGH (ref 15–41)
Albumin: 3 g/dL — ABNORMAL LOW (ref 3.5–5.0)
Alkaline Phosphatase: 138 U/L — ABNORMAL HIGH (ref 38–126)
Anion gap: 13 (ref 5–15)
BUN: 14 mg/dL (ref 6–20)
CO2: 23 mmol/L (ref 22–32)
Calcium: 9.2 mg/dL (ref 8.9–10.3)
Chloride: 102 mmol/L (ref 98–111)
Creatinine, Ser: 0.84 mg/dL (ref 0.61–1.24)
GFR, Estimated: >60 mL/min (ref >60)
Glucose, Bld: 169 mg/dL — ABNORMAL HIGH (ref 70–99)
Potassium: 4.2 mmol/L (ref 3.5–5.1)
Sodium: 138 mmol/L (ref 135–145)
Total Bilirubin: 0.8 mg/dL (ref 0.3–1.2)
Total Protein: 7.3 g/dL (ref 6.5–8.1)

## 2020-03-13 LAB — HEPARIN LEVEL (UNFRACTIONATED)
Heparin Unfractionated: 0.17 IU/mL — ABNORMAL LOW (ref 0.30–0.70)
Heparin Unfractionated: 0.31 IU/mL (ref 0.30–0.70)
Heparin Unfractionated: 0.29 IU/mL — ABNORMAL LOW (ref 0.30–0.70)

## 2020-03-13 LAB — HEMOGLOBIN A1C
Hgb A1c MFr Bld: 11 % — ABNORMAL HIGH (ref 4.8–5.6)
Mean Plasma Glucose: 269 mg/dL

## 2020-03-13 LAB — SARS CORONAVIRUS 2 (TAT 6-24 HRS): SARS Coronavirus 2: NEGATIVE

## 2020-03-13 LAB — PROCALCITONIN: Procalcitonin: 3.53 ng/mL

## 2020-03-13 LAB — CBG MONITORING, ED
Glucose-Capillary: 162 mg/dL — ABNORMAL HIGH (ref 70–99)
Glucose-Capillary: 203 mg/dL — ABNORMAL HIGH (ref 70–99)

## 2020-03-13 LAB — GLUCOSE, CAPILLARY
Glucose-Capillary: 173 mg/dL — ABNORMAL HIGH (ref 70–99)
Glucose-Capillary: 220 mg/dL — ABNORMAL HIGH (ref 70–99)

## 2020-03-13 MED ORDER — HEPARIN (PORCINE) 25000 UT/250ML-% IV SOLN
2800.0000 [IU]/h | INTRAVENOUS | Status: DC
  Administered 2020-03-13 (×2): 2850 [IU]/h via INTRAVENOUS
  Administered 2020-03-14 (×3): 2950 [IU]/h via INTRAVENOUS
  Administered 2020-03-15: 2800 [IU]/h via INTRAVENOUS
  Filled 2020-03-13 (×6): qty 250

## 2020-03-13 MED ORDER — HEPARIN BOLUS VIA INFUSION
3000.0000 [IU] | Freq: Once | INTRAVENOUS | Status: AC
  Administered 2020-03-13: 3000 [IU] via INTRAVENOUS
  Filled 2020-03-13: qty 3000

## 2020-03-13 NOTE — Progress Notes (Signed)
[place] for IV heparin  Indication: superior mesenteric vein thrombosis  No Known Allergies  Patient Measurements: Height: 6\' 3"  (190.5 cm) Weight: 122.5 kg (270 lb) IBW/kg (Calculated) : 84.5 Heparin Dosing Weight: 110.7 kg   Vital Signs: Temp: 97.3 F (36.3 C) (02/06 0700) Temp Source: Oral (02/06 0700) BP: 114/78 (02/06 1032) Pulse Rate: 82 (02/06 1032)  Labs: Recent Labs    03/12/20 0843 03/12/20 2100 03/13/20 0430 03/13/20 0500 03/13/20 1135  HGB 13.8  --   --  12.7*  --   HCT 41.0  --   --  37.8*  --   PLT 406*  --   --  435*  --   HEPARINUNFRC  --  <0.10* 0.17*  --  0.31  CREATININE 0.69  --   --  0.84  --     Estimated Creatinine Clearance: 141.8 mL/min (by C-G formula based on SCr of 0.84 mg/dL).   Medical History: Past Medical History:  Diagnosis Date  . Diabetes mellitus without complication ([place])   . Dyslipidemia   . Hyperlipidemia   . 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 No anticoagulation on med rec.   Today, 03/13/20  Heparin level 0.31 - therapeutic but on low end  on IV Heparin @ 2700 units/hr  CBC WNL   No bleeding noted per RN  Heparin has been infusing without interruptions per RN  Goal of Therapy:  Heparin level 0.3-0.7 units/ml Monitor platelets by anticoagulation protocol: Yes   Plan:   Increase heparin drip to 2850 units/hr   Obtain HL 6 hours after rate increase   Daily CBC   Monitor for signs and symptoms of bleeding    Royetta Asal, PharmD, BCPS 03/13/2020 1:08 PM

## 2020-03-13 NOTE — ED Notes (Signed)
Pt up in chair. Linens changed and pt readjusted in bed. Pt provided warm blankets.

## 2020-03-13 NOTE — ED Notes (Signed)
Wife at bedside. Pt and wife both resting comfortably.

## 2020-03-13 NOTE — Consult Note (Signed)
Hospital Consult    Reason for Consult: Superior mesenteric vein branch thrombosis Referring Physician: ED MRN #:  [id number]  History of Present Illness: This is a 55 y.o. male with hypertension, hyperlipidemia, diabetes that vascular surgery has been consulted for a superior mesenteric vein branch thrombosis.  He presented to the ED at [facility] yesterday and reported 3 days of generalized abdominal pain.  Apparently also no bowel movement in about 8 or 9 days according to his wife.  CT abdomen pelvis was obtained in the ED that was concerning for acute thrombosis in a branch of the superior mesenteric vein in the right lower quadrant.  Patient does have a history of pancreatitis and was admitted last year.  He denies any current alcohol abuse and states he has never had his gallbladder removed either and no hx of gallstone pancreatitis.  No prior events as it relates to thromboembolic events.  He denies any history of abdominal surgery.  Pain is slightly improved today.  Past Medical History:  Diagnosis Date  . Diabetes mellitus without complication ([place])   . Dyslipidemia   . Hyperlipidemia   . Hypertension     Past Surgical History:  Procedure Laterality Date  . NO PAST SURGERIES      No Known Allergies  Prior to Admission medications   Medication Sig Start Date End Date Taking? Authorizing Provider  acetaminophen (TYLENOL) 500 MG tablet Take 1,000 mg by mouth every 6 (six) hours as needed for mild pain.   Yes [provider]  allopurinol (ZYLOPRIM) 100 MG tablet Take 1 tablet (100 mg total) by mouth daily. 02/03/20 05/03/20 Yes McClung, Dionne Bucy, PA-C  Ascorbic Acid (VITAMIN C) 1000 MG tablet Take 1,000 mg by mouth daily.   Yes [provider]  aspirin 81 MG chewable tablet Chew 81 mg by mouth daily.   Yes [provider]  atorvastatin (LIPITOR) 40 MG tablet Take 1 tablet (40 mg total) by mouth daily. 02/03/20 05/03/20 Yes McClung, Dionne Bucy, PA-C   Garlic 5102 MG CAPS Take 1,200 mg by mouth daily.   Yes [provider]  glimepiride (AMARYL) 2 MG tablet Take 1 tablet (2 mg total) by mouth daily with breakfast. 02/03/20  Yes McClung, Angela M, PA-C  ibuprofen (ADVIL) 200 MG tablet Take 600 mg by mouth every 6 (six) hours as needed for mild pain.   Yes [provider]  insulin glargine (LANTUS SOLOSTAR) 100 UNIT/ML Solostar Pen Inject 16 Units into the skin daily. 02/24/20  Yes Gildardo Pounds, NP  losartan (COZAAR) 50 MG tablet Take 1 tablet (50 mg total) by mouth daily. 02/03/20  Yes McClung, Angela M, PA-C  metFORMIN (GLUCOPHAGE-XR) 500 MG 24 hr tablet Take 1 tablet (500 mg total) by mouth 3 (three) times daily. 02/03/20 03/04/20 Yes McClung, Dionne Bucy, PA-C  Multiple Vitamins-Minerals (MULTI FOR HIM 50+) TABS Take 1 tablet by mouth daily.   Yes [provider]  Blood Glucose Monitoring Suppl (TRUE METRIX METER) w/Device KIT 1 each by Does not apply route in the morning and at bedtime. 02/03/20   Argentina Donovan, PA-C  glucose blood (TRUE METRIX BLOOD GLUCOSE TEST) test strip Use as instructed. Check blood glucose level by fingerstick twice per day. E11.65 02/03/20   Argentina Donovan, PA-C  traMADol (ULTRAM) 50 MG tablet Take 1 tablet (50 mg total) by mouth every 8 (eight) hours as needed for severe pain. Patient not taking: No sig reported 08/06/19 08/05/20  [name], DO  TRUEplus Lancets 28G MISC 1 each by Does not apply route in the morning and at bedtime. 02/03/20   Argentina Donovan, PA-C    Social History   Socioeconomic History  . Marital status: Single    Spouse name: Not on file  . Number of children: Not on file  . Years of education: Not on file  . Highest education level: Not on file  Occupational History  . Not on file  Tobacco Use  . Smoking status: Former Smoker    Types: Cigarettes  . Smokeless tobacco: Never Used  . Tobacco comment: Quit smoking 5 years ago  Substance and Sexual  Activity  . Alcohol use: Not Currently    Comment: rare  . Drug use: No  . Sexual activity: Yes  Other Topics Concern  . Not on file  Social History Narrative   Works at [employer]   Lives in [place] with girlfriend    Social Determinants of Radio broadcast assistant Strain: Not on file  Food Insecurity: Not on file  Transportation Needs: Not on file  Physical Activity: Not on file  Stress: Not on file  Social Connections: Not on file  Intimate Partner Violence: Not on file     Family History  Problem Relation Age of Onset  . Diabetes Mother   . Diabetes Father   . Pancreatitis Neg Hx     ROS: [x]  Positive   [ ]  Negative   [ ]  All sytems reviewed and are negative  Cardiovascular: []  chest pain/pressure []  palpitations []  SOB lying flat []  DOE []  pain in legs while walking []  pain in legs at rest []  pain in legs at night []  non-healing ulcers []  hx of DVT []  swelling in legs  Pulmonary: []  productive cough []  asthma/wheezing []  home O2  Neurologic: []  weakness in []  arms []  legs []  numbness in []  arms []  legs []  hx of CVA []  mini stroke [] difficulty speaking or slurred speech []  temporary loss of vision in one eye []  dizziness  Hematologic: []  hx of cancer []  bleeding problems []  problems with blood clotting easily  Endocrine:   []  diabetes []  thyroid disease  GI []  vomiting blood []  blood in stool X Abdominal pain GU: []  CKD/renal failure []  HD--[]  M/W/F or []  T/T/S []  burning with urination []  blood in urine  Psychiatric: []  anxiety []  depression  Musculoskeletal: []  arthritis []  joint pain  Integumentary: []  rashes []  ulcers  Constitutional: []  fever []  chills   Physical Examination  Vitals:   03/13/20 1032 03/13/20 1200  BP: 114/78 112/68  Pulse: 82 71  Resp: (!) 21 16  Temp:    SpO2: 98% 95%   Body mass index is 33.75 kg/m.  General:  NAD HENT: WNL, normocephalic Pulmonary: no respiratory  distress Cardiac: regular, without  Murmurs, rubs or gallops Abdomen: Generalized tenderness, no rebound or guarding Vascular Exam/Pulses: Palpable radial pulses bilateral upper extremities Palpable femoral pulses bilateral groins Palpable dorsalis pedis pulses bilateral lower extremities Musculoskeletal: no muscle wasting or atrophy  Neurologic: A&O X 3; Appropriate Affect ; SENSATION: normal; MOTOR FUNCTION:  moving all extremities equally. Speech is fluent/normal   CBC    Component Value Date/Time   WBC 16.7 (H) 03/13/2020 0500   RBC 3.97 (L) 03/13/2020 0500   HGB 12.7 (L) 03/13/2020 0500   HGB 14.8 09/23/2019 1107   HCT 37.8 (L) 03/13/2020 0500   HCT 42.3 09/23/2019 1107   PLT 435 (H) 03/13/2020 0500  PLT 290 09/23/2019 1107   MCV 95.2 03/13/2020 0500   MCV 96 09/23/2019 1107   MCV 101 (H) 05/10/2014 1321   MCH 32.0 03/13/2020 0500   MCHC 33.6 03/13/2020 0500   RDW 14.6 03/13/2020 0500   RDW 12.8 09/23/2019 1107   RDW 12.9 05/10/2014 1321   LYMPHSABS 3.8 (H) 09/23/2019 1107   LYMPHSABS 2.3 05/10/2014 1321   MONOABS 1.2 (H) 05/10/2014 1321   EOSABS 0.2 09/23/2019 1107   EOSABS 0.3 05/10/2014 1321   BASOSABS 0.1 09/23/2019 1107   BASOSABS 0 05/10/2014 1444   BASOSABS 0.1 05/10/2014 1321    BMET    Component Value Date/Time   NA 138 03/13/2020 0500   NA 140 02/03/2020 1616   NA 136 05/10/2014 1321   K 4.2 03/13/2020 0500   K 3.8 05/10/2014 1321   CL 102 03/13/2020 0500   CL 96 (L) 05/10/2014 1321   CO2 23 03/13/2020 0500   CO2 29 05/10/2014 1321   GLUCOSE 169 (H) 03/13/2020 0500   GLUCOSE 145 (H) 05/10/2014 1321   BUN 14 03/13/2020 0500   BUN 14 02/03/2020 1616   BUN 13 05/10/2014 1321   CREATININE 0.84 03/13/2020 0500   CREATININE 0.76 05/10/2014 1321   CALCIUM 9.2 03/13/2020 0500   CALCIUM 10.3 05/10/2014 1321   GFRNONAA >60 03/13/2020 0500   GFRNONAA >60 05/10/2014 1321   GFRAA 114 02/03/2020 1616   GFRAA >60 05/10/2014 1321    COAGS: No  results found for: INR, PROTIME   Non-Invasive Vascular Imaging:     CT abdomen pelvis was reviewed and I agree there does appear to be thrombosis of a branch of the superior mesenteric vein with stranding in the right lower quadrant.   ASSESSMENT/PLAN: This is a 55 y.o. 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  I discussed with him and his wife in detail that this is generally managed with conservative management unless he has a worsening clinical exam concerning for bowel ischemia.  I recommend IV heparin as discussed with the ED yesterday for systemic anticoagulation and need for serial abdominal exams.  I have recommended transfer to [facility] so vascular surgery can follow him.  I would start him on IVF for hydration.  He will ultimately require 6 months of anticoagulation.  The etiology for this is not exactly clear to me as it is associated with pancreatitis but he reports that his pancreatitis was about 6 months ago.   Marty Heck, MD [facility] Office: [place]

## 2020-03-13 NOTE — Progress Notes (Signed)
ANTICOAGULATION CONSULT NOTE   Pharmacy Consult for IV heparin  Indication: superior mesenteric vein thrombosis  No Known Allergies  Patient Measurements: Height: 6\' 3"  (190.5 cm) Weight: 122.5 kg (270 lb) IBW/kg (Calculated) : 84.5 Heparin Dosing Weight: 110.7 kg   Vital Signs: Temp: 98.2 F (36.8 C) (02/06 2251) Temp Source: Oral (02/06 2251) BP: 130/82 (02/06 2251) Pulse Rate: 75 (02/06 2251)  Labs: Recent Labs    03/12/20 0843 03/12/20 2100 03/13/20 0430 03/13/20 0500 03/13/20 1135 03/13/20 2157  HGB 13.8  --   --  12.7*  --   --   HCT 41.0  --   --  37.8*  --   --   PLT 406*  --   --  435*  --   --   HEPARINUNFRC  --    < > 0.17*  --  0.31 0.29*  CREATININE 0.69  --   --  0.84  --   --    < > = values in this interval not displayed.    Estimated Creatinine Clearance: 141.8 mL/min (by C-G formula based on SCr of 0.84 mg/dL).   Assessment: Pharmacy is consulted to dose heparin infusion in 55 yo male diagnosed with superior mesenteric vein thrombosis. No anticoagulation on med rec.   Heparin level down to slightly subtherapeutic (0.29) on gtt at 2850 units/hr. No bleeding reported per RN. RN reports that line keeps beeping whenever pt moves arm so this may be contributing to low level.  Goal of Therapy:  Heparin level 0.3-0.7 units/ml Monitor platelets by anticoagulation protocol: Yes   Plan:  Increase heparin drip to 2950 units/hr  HL 6 hours after rate increase   Sherlon Handing, PharmD, BCPS Please see amion for complete clinical pharmacist phone list 03/13/2020 10:55 PM

## 2020-03-13 NOTE — Progress Notes (Signed)
PROGRESS NOTE    [name]  ZOX:[id number] DOB: 04/04/1965 DOA: 03/12/2020 PCP: Gildardo Pounds, NP    Brief Narrative:  Riley Wiggins is a 55 year old male with past medical history significant for essential hypertension, hyperlipidemia, type 2 diabetes mellitus who presented to [facility] long ED with abdominal pain. Patient reports onset 3 days ago, described as episodic cramping. Patient has tried Tylenol, Advil and heating pads at home with no significant improvement. Additionally he reports decreased appetite and was concerned about having recurrent pancreatitis.  In the ED, temperature 98.8, HR 96, RR 18, BP 115/84, SPO2 97% on room air. Sodium 135, potassium 4.0, chloride 99, CO2 24, glucose 220, BUN 10, creatinine 0.69. AST 26, ALT 55. Lipase 24. WBC count 17.5, hemoglobin 13.8, platelets 406. Urinalysis unrevealing. Ultrasound right upper quadrant with layering gallbladder sludge without evidence of acute cholecystitis, hepatic steatosis. CT abdomen/pelvis with contrast with acute thrombosis branch of superior mesenteric vein right lower quadrant with surrounding inflammatory changes and enlarged mesenteric lymph nodes. Vascular surgery was consulted and recommended initiation of heparin drip and transferred to [facility]. Hospitalist service consulted for further evaluation and management.  Assessment & Plan:   Principal Problem:   Superior mesenteric vein thrombosis (HCC) Active Problems:   Essential hypertension   Dyslipidemia   Type 2 diabetes mellitus with hyperglycemia, without long-term current use of insulin (HCC)   Superior mesenteric vein thrombosis, acute Patient presenting to ED with persistent abdominal discomfort despite Tylenol, Advil and heating pad. Right upper quadrant ultrasound with no significant finding. Lipase within normal limits. CT abdomen/pelvis with contrast notable for acute superior mesenteric vein thrombosis. Case was discussed with vascular surgery who  recommended initiation of heparin drip and transferred to [facility] for further evaluation. --Pending transfer to [facility] --Continue heparin drip, pharmacy consulted for monitoring/dosing --Continue Zosyn --Continue to monitor CBC daily --Supportive care  Transaminitis Mild elevation in AST and ALT. Bilirubin within normal limits. Right upper quadrant ultrasound notable for hepatic steatosis. --Holding statin --Monitor CMP daily  Type 2 diabetes mellitus, poorly controlled with hyperglycemia Hemoglobin A1c 11.0, poorly controlled. Home regimen includes glimepiride 2 mg p.o. daily, Lantus 16 units subcutaneously daily, Metformin 500 mg p.o. 3 times daily. --diabetic educator consult --Lantus 8 units obviously daily --SSI for further coverage --CBGs every 4 hours  Essential hypertension On losartan 50 mg p.o. daily at home. --BP 115/72 this morning --Hold home losartan for now --Metoprolol 5 mg IV every 6 hours as needed --Continue to closely monitor BP  Hyperlipidemia: Holding home atorvastatin 40 mg p.o. daily   DVT prophylaxis: Heparin drip   Code Status: Full Code Family Communication: Updated patient extensively at bedside  Disposition Plan:  Level of care: Progressive Status is: Inpatient  Remains inpatient appropriate because:Ongoing diagnostic testing needed not appropriate for outpatient work up, Unsafe d/c plan, IV treatments appropriate due to intensity of illness or inability to take PO and Inpatient level of care appropriate due to severity of illness   Dispo: The patient is from: Home              Anticipated d/c is to: Home              Anticipated d/c date is: 3 days              Patient currently is not medically stable to d/c.   Difficult to place patient No   Consultants:   Vascular surgery, Dr. Carlis Abbott  Procedures:   None  Antimicrobials:  Zosyn   Subjective: Patient seen and examined at bedside, continues in ED holding area awaiting  transfer to [facility]. Patient states abdominal discomfort is much improved since yesterday. No other specific complaints or concerns at this time. Denies headache, no chest pain, no palpitations, no shortness of breath, no weakness, no fatigue, no paresthesias. No acute events overnight per nursing staff.  Objective: Vitals:   03/13/20 0300 03/13/20 0400 03/13/20 0500 03/13/20 0700  BP: (!) 144/101 111/65 (!) 146/97 115/72  Pulse: 79 74 80 72  Resp: 16 15 14 16   Temp:   98.8 F (37.1 C) (!) 97.3 F (36.3 C)  TempSrc:   Oral Oral  SpO2: 96% 97% 97% 97%  Weight:      Height:        Intake/Output Summary (Last 24 hours) at 03/13/2020 1007 Last data filed at 03/12/2020 2005 Gross per 24 hour  Intake 1136.63 ml  Output --  Net 1136.63 ml   Filed Weights   03/12/20 1445  Weight: 122.5 kg    Examination:  General exam: Appears calm and comfortable  Respiratory system: Clear to auscultation. Respiratory effort normal. On 2 L nasal cannula with SPO2 97% Cardiovascular system: S1 & S2 heard, RRR. No JVD, murmurs, rubs, gallops or clicks. No pedal edema. Gastrointestinal system: Abdomen is nondistended, soft and nontender. No organomegaly or masses felt. Normal bowel sounds heard. Central nervous system: Alert and oriented. No focal neurological deficits. Extremities: Symmetric 5 x 5 power. Skin: No rashes, lesions or ulcers Psychiatry: Judgement and insight appear normal. Mood & affect appropriate.     Data Reviewed: I have personally reviewed following labs and imaging studies  CBC: Recent Labs  Lab 03/12/20 0843 03/13/20 0500  WBC 17.5* 16.7*  HGB 13.8 12.7*  HCT 41.0 37.8*  MCV 93.4 95.2  PLT 406* 993*   Basic Metabolic Panel: Recent Labs  Lab 03/12/20 0843 03/13/20 0500  NA 135 138  K 4.0 4.2  CL 99 102  CO2 24 23  GLUCOSE 220* 169*  BUN 10 14  CREATININE 0.69 0.84  CALCIUM 9.6 9.2   GFR: Estimated Creatinine Clearance: 141.8 mL/min (by C-G formula based  on SCr of 0.84 mg/dL). Liver Function Tests: Recent Labs  Lab 03/12/20 0843 03/13/20 0500  AST 26 49*  ALT 55* 68*  ALKPHOS 137* 138*  BILITOT 0.8 0.8  PROT 7.9 7.3  ALBUMIN 3.4* 3.0*   Recent Labs  Lab 03/12/20 0843  LIPASE 24   No results for input(s): AMMONIA in the last 168 hours. Coagulation Profile: No results for input(s): INR, PROTIME in the last 168 hours. Cardiac Enzymes: No results for input(s): CKTOTAL, CKMB, CKMBINDEX, TROPONINI in the last 168 hours. BNP (last 3 results) No results for input(s): PROBNP in the last 8760 hours. HbA1C: Recent Labs    03/12/20 0843  HGBA1C 11.0*   CBG: Recent Labs  Lab 03/12/20 0850 03/12/20 1847 03/12/20 2137 03/13/20 0745  GLUCAP 213* 149* 185* 162*   Lipid Profile: No results for input(s): CHOL, HDL, LDLCALC, TRIG, CHOLHDL, LDLDIRECT in the last 72 hours. Thyroid Function Tests: No results for input(s): TSH, T4TOTAL, FREET4, T3FREE, THYROIDAB in the last 72 hours. Anemia Panel: No results for input(s): VITAMINB12, FOLATE, FERRITIN, TIBC, IRON, RETICCTPCT in the last 72 hours. Sepsis Labs: Recent Labs  Lab 03/12/20 1542 03/12/20 1750 03/13/20 0500  PROCALCITON 1.80  --  3.53  LATICACIDVEN  --  1.5  --     Recent Results (from the past  240 hour(s))  SARS CORONAVIRUS 2 (TAT 6-24 HRS) Nasopharyngeal Nasopharyngeal Swab     Status: None   Collection Time: 03/12/20  1:24 PM   Specimen: Nasopharyngeal Swab  Result Value Ref Range Status   SARS Coronavirus 2 NEGATIVE NEGATIVE Final    Comment: (NOTE) SARS-CoV-2 target nucleic acids are NOT DETECTED.  The SARS-CoV-2 RNA is generally detectable in upper and lower respiratory specimens during the acute phase of infection. Negative results do not preclude SARS-CoV-2 infection, do not rule out co-infections with other pathogens, and should not be used as the sole basis for treatment or other patient management decisions. Negative results must be combined with  clinical observations, patient history, and epidemiological information. The expected result is Negative.  Fact Sheet for Patients: SugarRoll.be  Fact Sheet for Healthcare Providers: https://www.woods-mathews.com/  This test is not yet approved or cleared by the [place] FDA and  has been authorized for detection and/or diagnosis of SARS-CoV-2 by FDA under an Emergency Use Authorization (EUA). This EUA will remain  in effect (meaning this test can be used) for the duration of the COVID-19 declaration under Se ction 564(b)(1) of the Act, 21 U.S.C. section 360bbb-3(b)(1), unless the authorization is terminated or revoked sooner.  Performed at [facility], [employer] [address], [place], [place] [address]          Radiology Studies: CT ABDOMEN PELVIS W CONTRAST  Result Date: 03/12/2020 CLINICAL DATA:  Acute generalized abdominal pain. EXAM: CT ABDOMEN AND PELVIS WITH CONTRAST TECHNIQUE: Multidetector CT imaging of the abdomen and pelvis was performed using the standard protocol following bolus administration of intravenous contrast. CONTRAST:  119mL OMNIPAQUE IOHEXOL 300 MG/ML  SOLN COMPARISON:  August 03, 2019. FINDINGS: Lower chest: No acute abnormality. Hepatobiliary: No focal liver abnormality is seen. No gallstones, gallbladder wall thickening, or biliary dilatation. Pancreas: Unremarkable. No pancreatic ductal dilatation or surrounding inflammatory changes. Spleen: Normal in size without focal abnormality. Adrenals/Urinary Tract: Adrenal glands appear normal. Small nonobstructive right renal calculus is noted. No hydronephrosis or renal obstruction is noted. Urinary bladder is unremarkable. Stomach/Bowel: Stomach is within normal limits. Appendix appears normal. No evidence of bowel wall thickening, distention, or inflammatory changes. Vascular/Lymphatic: There appears to be acute thrombosis involving a branch of the superior mesenteric vein  seen in the right lower quadrant, with surrounding inflammatory changes. There are enlarged mesenteric lymph nodes in this area as well. Atherosclerosis of abdominal aorta is noted without aneurysm formation. Reproductive: Prostate is unremarkable. Other: No abdominal wall hernia or abnormality. No abdominopelvic ascites. Musculoskeletal: No acute or significant osseous findings. IMPRESSION: 1. There appears to be acute thrombosis involving a branch of the superior mesenteric vein seen in the right lower quadrant, with surrounding inflammatory changes. There are enlarged mesenteric lymph nodes in this area as well. Critical Value/emergent results were called by telephone at the time of interpretation on 03/12/2020 at 12:59 pm to provider [facility] , who verbally acknowledged these results. 2. Small nonobstructive right renal calculus. No hydronephrosis or renal obstruction is noted. 3. Aortic atherosclerosis. Aortic Atherosclerosis (ICD10-I70.0). Electronically Signed   By: Marijo Conception M.D.   On: 03/12/2020 12:59   US Abdomen Limited  Result Date: 03/12/2020 CLINICAL DATA:  Generalized abdominal pain for 3-4 days. EXAM: ULTRASOUND ABDOMEN LIMITED RIGHT UPPER QUADRANT COMPARISON:  September 02, 2019 FINDINGS: Gallbladder: No gallstones or wall thickening visualized. Layering gallbladder sludge. No sonographic Murphy sign noted by sonographer. Common bile duct: Diameter: 3 mm Liver: No focal lesion identified. Heterogeneous  parenchymal echogenicity. Portal vein is patent on color Doppler imaging with normal direction of blood flow towards the liver. Other: None. IMPRESSION: Layering gallbladder sludge, without sonographic evidence of acute cholecystitis. Heterogeneous echotexture of the liver, usually seen with hepatic steatosis. Electronically Signed   By: Fidela Salisbury M.D.   On: 03/12/2020 11:23        Scheduled Meds: . insulin aspart  0-9 Units Subcutaneous TID WC  . insulin glargine  8 Units  Subcutaneous QHS   Continuous Infusions: . sodium chloride 100 mL/hr at 03/12/20 2134  . heparin 2,700 Units/hr (03/13/20 0547)  . piperacillin-tazobactam (ZOSYN)  IV 3.375 g (03/13/20 0250)     LOS: 1 day    Time spent: 38 minutes spent on chart review, discussion with nursing staff, consultants, updating family and interview/physical exam; more than 50% of that time was spent in counseling and/or coordination of care.    Deontrey Massi J [place] DO [facility] Available via Epic secure chat 7am-7pm After these hours, please refer to coverage provider listed on [email or web address] 03/13/2020, 10:07 AM

## 2020-03-14 DIAGNOSIS — I10 Essential (primary) hypertension: Secondary | ICD-10-CM

## 2020-03-14 DIAGNOSIS — R7401 Elevation of levels of liver transaminase levels: Secondary | ICD-10-CM

## 2020-03-14 DIAGNOSIS — E1165 Type 2 diabetes mellitus with hyperglycemia: Secondary | ICD-10-CM | POA: Diagnosis not present

## 2020-03-14 DIAGNOSIS — R6510 Systemic inflammatory response syndrome (SIRS) of non-infectious origin without acute organ dysfunction: Secondary | ICD-10-CM

## 2020-03-14 DIAGNOSIS — K55069 Acute infarction of intestine, part and extent unspecified: Secondary | ICD-10-CM | POA: Diagnosis not present

## 2020-03-14 LAB — GLUCOSE, CAPILLARY
Glucose-Capillary: 142 mg/dL — ABNORMAL HIGH (ref 70–99)
Glucose-Capillary: 228 mg/dL — ABNORMAL HIGH (ref 70–99)
Glucose-Capillary: 189 mg/dL — ABNORMAL HIGH (ref 70–99)
Glucose-Capillary: 190 mg/dL — ABNORMAL HIGH (ref 70–99)

## 2020-03-14 LAB — COMPREHENSIVE METABOLIC PANEL
ALT: 66 U/L — ABNORMAL HIGH (ref 0–44)
AST: 35 U/L (ref 15–41)
Albumin: 2.3 g/dL — ABNORMAL LOW (ref 3.5–5.0)
Alkaline Phosphatase: 112 U/L (ref 38–126)
Anion gap: 10 (ref 5–15)
BUN: 8 mg/dL (ref 6–20)
CO2: 23 mmol/L (ref 22–32)
Calcium: 9.1 mg/dL (ref 8.9–10.3)
Chloride: 104 mmol/L (ref 98–111)
Creatinine, Ser: 0.76 mg/dL (ref 0.61–1.24)
GFR, Estimated: >60 mL/min (ref >60)
Glucose, Bld: 169 mg/dL — ABNORMAL HIGH (ref 70–99)
Potassium: 3.9 mmol/L (ref 3.5–5.1)
Sodium: 137 mmol/L (ref 135–145)
Total Bilirubin: 0.8 mg/dL (ref 0.3–1.2)
Total Protein: 6.5 g/dL (ref 6.5–8.1)

## 2020-03-14 LAB — CBC
HCT: 36.5 % — ABNORMAL LOW (ref 39.0–52.0)
Hemoglobin: 11.9 g/dL — ABNORMAL LOW (ref 13.0–17.0)
MCH: 31.1 pg (ref 26.0–34.0)
MCHC: 32.6 g/dL (ref 30.0–36.0)
MCV: 95.3 fL (ref 80.0–100.0)
Platelets: 428 10*3/uL — ABNORMAL HIGH (ref 150–400)
RBC: 3.83 MIL/uL — ABNORMAL LOW (ref 4.22–5.81)
RDW: 14.5 % (ref 11.5–15.5)
WBC: 13.3 10*3/uL — ABNORMAL HIGH (ref 4.0–10.5)
nRBC: 0 % (ref 0.0–0.2)

## 2020-03-14 LAB — HEPARIN LEVEL (UNFRACTIONATED): Heparin Unfractionated: 0.51 IU/mL (ref 0.30–0.70)

## 2020-03-14 LAB — LACTIC ACID, PLASMA: Lactic Acid, Venous: 1.3 mmol/L (ref 0.5–1.9)

## 2020-03-14 LAB — PROCALCITONIN: Procalcitonin: 2.62 ng/mL

## 2020-03-14 MED ORDER — AMOXICILLIN-POT CLAVULANATE 875-125 MG PO TABS
1.0000 | ORAL_TABLET | Freq: Two times a day (BID) | ORAL | Status: DC
  Administered 2020-03-14 – 2020-03-15 (×3): 1 via ORAL
  Filled 2020-03-14 (×3): qty 1

## 2020-03-14 MED ORDER — INSULIN ASPART 100 UNIT/ML ~~LOC~~ SOLN
4.0000 [IU] | Freq: Three times a day (TID) | SUBCUTANEOUS | Status: DC
  Administered 2020-03-15 (×2): 4 [IU] via SUBCUTANEOUS

## 2020-03-14 MED ORDER — MORPHINE SULFATE (PF) 4 MG/ML IV SOLN: 3.0000 mg | INTRAVENOUS | Status: DC | PRN

## 2020-03-14 MED ORDER — INSULIN ASPART 100 UNIT/ML ~~LOC~~ SOLN
0.0000 [IU] | Freq: Three times a day (TID) | SUBCUTANEOUS | Status: DC
  Administered 2020-03-14 – 2020-03-15 (×2): 3 [IU] via SUBCUTANEOUS
  Administered 2020-03-15: 5 [IU] via SUBCUTANEOUS

## 2020-03-14 MED ORDER — OXYCODONE HCL 5 MG PO TABS: 5.0000 mg | ORAL_TABLET | Freq: Four times a day (QID) | ORAL | Status: DC | PRN

## 2020-03-14 MED ORDER — INSULIN ASPART 100 UNIT/ML ~~LOC~~ SOLN: 0.0000 [IU] | Freq: Every day | SUBCUTANEOUS | Status: DC

## 2020-03-14 NOTE — TOC Benefit Eligibility Note (Signed)
Transition of Care [facility]) Benefit Eligibility Note    Patient Details  Name: Ismeal Heider MRN: [id number] Date of Birth: 1965/05/18            Prescription Coverage Preferred Pharmacy: XARELTO 20 MG DAILY  and  ELIQUIS  5 MG BID              Additional Notes: NO PHARMACY BENEFITS  ON  Monika Salk Phone Number: 03/14/2020, 3:45 PM

## 2020-03-14 NOTE — Progress Notes (Signed)
Inpatient Diabetes Program Recommendations  AACE/ADA: New Consensus Statement on Inpatient Glycemic Control (2015)  Target Ranges:  Prepandial:   less than 140 mg/dL      Peak postprandial:   less than 180 mg/dL (1-2 hours)      Critically ill patients:  140 - 180 mg/dL   Lab Results  Component Value Date   GLUCAP 228 (H) 03/14/2020   HGBA1C 11.0 (H) 03/12/2020    Diabetes history:  DM2 Outpatient Diabetes medications:  Lantus 16 units daily Metformin 500 mg TID Glimepiride 2 mg daily Current orders for Inpatient glycemic control:  Lantus 8 units daily Novolog 0-9 units TID  Note:  Spoke with patient and spouse at bedside.  He was recently started on Lantus 1 month ago.  Current A1C is 11% which is down from 13%.  He is current with Dr. Vella Kohler.  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  He does not drink and beverages with sugar and he is now watching his diet and serving sizes.  We reviewed CHO's and The Plate Method.  He checks his blood sugar at home with is meter.  He has noticed improved CBG's with Lantus.  We reviewed hypoglycemia, signs, symptoms and treatments.  Will continue to follow while inpatient.  Thank you, Reche Dixon, RN, BSN Diabetes Coordinator Inpatient Diabetes Program [phone] (team pager from 8a-5p)

## 2020-03-14 NOTE — Progress Notes (Signed)
ANTICOAGULATION CONSULT NOTE   Pharmacy Consult for IV heparin  Indication: superior mesenteric vein thrombosis  No Known Allergies  Patient Measurements: Height: 6\' 3"  (190.5 cm) Weight: 122.5 kg (270 lb) IBW/kg (Calculated) : 84.5 Heparin Dosing Weight: 110.7 kg   Vital Signs: Temp: 98.6 F (37 C) (02/07 0833) Temp Source: Oral (02/07 0833) BP: 119/80 (02/07 0833) Pulse Rate: 67 (02/07 0833)  Labs: Recent Labs    03/12/20 0843 03/12/20 2100 03/13/20 0500 03/13/20 1135 03/13/20 2157 03/14/20 0456  HGB 13.8  --  12.7*  --   --  11.9*  HCT 41.0  --  37.8*  --   --  36.5*  PLT 406*  --  435*  --   --  428*  HEPARINUNFRC  --    < >  --  0.31 0.29* 0.51  CREATININE 0.69  --  0.84  --   --  0.76   < > = values in this interval not displayed.    Estimated Creatinine Clearance: 148.9 mL/min (by C-G formula based on SCr of 0.76 mg/dL).   Assessment: Pharmacy is consulted to dose heparin infusion in 55 yo male diagnosed with superior mesenteric vein thrombosis. No anticoagulation PTA.   Heparin level 0.51 is therapeutic. H/H slight downtrend on IVF, plt stable. No bleeding reported.  Goal of Therapy:  Heparin level 0.3-0.7 units/ml Monitor platelets by anticoagulation protocol: Yes   Plan:  Continue heparin drip to 2950 units/hr  Monitor daily HL, CBC/plt Monitor for signs/symptoms of bleeding  F/u long term plan  Benetta Spar, PharmD, BCPS, [facility] Clinical Pharmacist  Please check AMION for all [place] phone numbers After 10:00 PM, call [place]

## 2020-03-14 NOTE — Progress Notes (Addendum)
Progress Note    03/14/2020 7:14 AM * No surgery found *  Subjective:  Denies pain, N or V.   Vitals:   03/13/20 2251 03/14/20 0255  BP: 130/82 116/72  Pulse: 75 82  Resp: 18 16  Temp: 98.2 F (36.8 C) 98 F (36.7 C)  SpO2: 95% 93%    Physical Exam: General appearance: Awake, alert in no apparent distress Cardiac: Heart rate and rhythm are regular Respirations: Nonlabored Abdomen: soft, ND, NT, active BS   CBC    Component Value Date/Time   WBC 13.3 (H) 03/14/2020 0456   RBC 3.83 (L) 03/14/2020 0456   HGB 11.9 (L) 03/14/2020 0456   HGB 14.8 09/23/2019 1107   HCT 36.5 (L) 03/14/2020 0456   HCT 42.3 09/23/2019 1107   PLT 428 (H) 03/14/2020 0456   PLT 290 09/23/2019 1107   MCV 95.3 03/14/2020 0456   MCV 96 09/23/2019 1107   MCV 101 (H) 05/10/2014 1321   MCH 31.1 03/14/2020 0456   MCHC 32.6 03/14/2020 0456   RDW 14.5 03/14/2020 0456   RDW 12.8 09/23/2019 1107   RDW 12.9 05/10/2014 1321   LYMPHSABS 3.8 (H) 09/23/2019 1107   LYMPHSABS 2.3 05/10/2014 1321   MONOABS 1.2 (H) 05/10/2014 1321   EOSABS 0.2 09/23/2019 1107   EOSABS 0.3 05/10/2014 1321   BASOSABS 0.1 09/23/2019 1107   BASOSABS 0 05/10/2014 1444   BASOSABS 0.1 05/10/2014 1321    BMET    Component Value Date/Time   NA 137 03/14/2020 0456   NA 140 02/03/2020 1616   NA 136 05/10/2014 1321   K 3.9 03/14/2020 0456   K 3.8 05/10/2014 1321   CL 104 03/14/2020 0456   CL 96 (L) 05/10/2014 1321   CO2 23 03/14/2020 0456   CO2 29 05/10/2014 1321   GLUCOSE 169 (H) 03/14/2020 0456   GLUCOSE 145 (H) 05/10/2014 1321   BUN 8 03/14/2020 0456   BUN 14 02/03/2020 1616   BUN 13 05/10/2014 1321   CREATININE 0.76 03/14/2020 0456   CREATININE 0.76 05/10/2014 1321   CALCIUM 9.1 03/14/2020 0456   CALCIUM 10.3 05/10/2014 1321   GFRNONAA >60 03/14/2020 0456   GFRNONAA >60 05/10/2014 1321   GFRAA 114 02/03/2020 1616   GFRAA >60 05/10/2014 1321     Intake/Output Summary (Last 24 hours) at 03/14/2020 0714 Last  data filed at 03/13/2020 1319 Gross per 24 hour  Intake 490.08 ml  Output --  Net 490.08 ml    HOSPITAL MEDICATIONS Scheduled Meds: . insulin aspart  0-9 Units Subcutaneous TID WC  . insulin glargine  8 Units Subcutaneous QHS   Continuous Infusions: . sodium chloride 100 mL/hr at 03/14/20 0544  . heparin 2,950 Units/hr (03/14/20 0536)  . piperacillin-tazobactam (ZOSYN)  IV 3.375 g (03/14/20 0546)   PRN Meds:.acetaminophen **OR** acetaminophen, metoprolol tartrate, morphine injection, prochlorperazine  Assessment and Plan:  55 y.o. 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 On heparin infusion and antibiotics.  VSS.  Tm 100.7  Mild leukocytosis>improving. Hgb stable. Platelet count normal  -continue supportive care and monitoring  -DVT prophylaxis:  Heparin infusion   Risa Grill, PA-C [facility] [phone] 03/14/2020  7:14 AM   I have seen and evaluated the patient. I agree with the PA note as documented above.  55 year old male seen with superior mesenteric vein branch thrombosis over the weekend.  White count is improving today.  Abdominal pain is also  improving.  He has been tolerating heparin and ultimately would need transition to [place] or Coumadin pending when he can afford and 6 months of anticoagulation.  Tolerating PO and having BM.  Overall looks better.  Marty Heck, MD [facility] [place] Office: [phone]

## 2020-03-14 NOTE — Progress Notes (Signed)
PROGRESS NOTE  Riley Wiggins SMO:[id number] DOB: 12-24-1965   PCP: Claiborne Rigg, NP  Patient is from: Home.  DOA: 03/12/2020 LOS: 2  Chief complaints: Abdominal pain  Brief Narrative / Interim history: 55 year old male with PMH of DM-2, HTN and HLD presented to [facility] ED with episodic cramping abdominal pain for 3 days, and found to have acute thrombosis of SMV branch to RLQ with surrounding inflammatory changes and enlarged mesenteric lymph nodes.  Vascular surgery consulted, and he was started on IV heparin and transferred to [facility] for close follow-up.  He was also started on IV Zosyn for SIRS with fever and leukocytosis.  His pro Cal is elevated.  Vascular surgery recommended 6 months of anticoagulation.  Subjective: Seen and examined earlier this morning.  No major events overnight or this morning.  He says he had about 8-9/10 RLQ pain this morning.  Pain improved to 7/10 after IV morphine.  He would like to eat.  He denies URI symptoms, chest pain, dyspnea, nausea, vomiting, diarrhea, melena, hematochezia or UTI symptoms.  Objective: Vitals:   03/14/20 0255 03/14/20 0727 03/14/20 0833 03/14/20 1103  BP: 116/72 118/76 119/80 120/69  Pulse: 82 69 67 82  Resp: 16 16 18 16   Temp: 98 F (36.7 C) 98.2 F (36.8 C) 98.6 F (37 C) 98.6 F (37 C)  TempSrc: Oral Oral Oral Oral  SpO2: 93% 95% 96% 95%  Weight:      Height:        Intake/Output Summary (Last 24 hours) at 03/14/2020 1328 Last data filed at 03/14/2020 1030 Gross per 24 hour  Intake 120 ml  Output 750 ml  Net -630 ml   Filed Weights   03/12/20 1445  Weight: 122.5 kg    Examination:  GENERAL: No apparent distress.  Nontoxic. HEENT: MMM.  Vision and hearing grossly intact.  NECK: Supple.  No apparent JVD.  RESP: On RA.  No IWOB.  Fair aeration bilaterally. CVS:  RRR. Heart sounds normal.  ABD/GI/GU: BS+. Abd soft.  RLQ tenderness with deep palpation.  No rebound or guarding. MSK/EXT:  Moves extremities. No apparent  deformity. No edema.  SKIN: no apparent skin lesion or wound NEURO: Awake, alert and oriented appropriately.  No apparent focal neuro deficit. PSYCH: Calm. Normal affect.  Procedures:  None  Microbiology summarized: COVID-19 PCR nonreactive.  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 oxycodone for better pain control -Full liquid diet -Check lactic acid -Needs age-appropriate screening including colonoscopy  SIRS:has fever and leukocytosis but no clear source of infection.  Procalcitonin elevated.  Improving. -Change IV Zosyn to p.o. Augmentin for 5 more days  Transaminitis: Mild elevated AST and ALT.  Bilirubin within normal.  RUQ [place] notable for hepatic steatosis but no cholecystitis.  Improving. -Continue monitoring  Uncontrolled NIDDM-2 with hyperglycemia: A1c 11.0%.  On Lantus 60 units daily, Metformin and glimepiride at home. Recent Labs  Lab 03/13/20 1134 03/13/20 1745 03/13/20 2108 03/14/20 0813 03/14/20 1104  GLUCAP 203* 173* 220* 142* 228*  -Continue Lantus 8 units at bedtime. -Add NovoLog 4 units AC -Increase SSI to moderate and add nightly coverage -Resume statin  Essential hypertension: Normotensive. -Continue holding home losartan.   Obesity Body mass index is 33.75 kg/m.         DVT prophylaxis:  On heparin infusion.  Code Status: Full code Family Communication:  Updated patient's wife at bedside. Level of care: Progressive.  De-escalate to MedSurg Status is: Inpatient  Remains inpatient appropriate because:Hemodynamically unstable, Ongoing active pain requiring inpatient pain management, IV treatments appropriate due to intensity of illness or inability to take PO and Inpatient level of care appropriate due to severity of illness   Dispo:  Patient From:  Home  Planned Disposition: Home  Expected discharge date: 03/16/2020  Medically stable for discharge: No         Consultants:  Vascular surgery   Sch Meds:  Scheduled Meds: . amoxicillin-clavulanate  1 tablet Oral Q12H  . insulin aspart  0-9 Units Subcutaneous TID WC  . insulin glargine  8 Units Subcutaneous QHS   Continuous Infusions: . sodium chloride 100 mL/hr at 03/14/20 0544  . heparin 2,950 Units/hr (03/14/20 0536)   PRN Meds:.acetaminophen **OR** acetaminophen, metoprolol tartrate, morphine injection, oxyCODONE, prochlorperazine  Antimicrobials: Anti-infectives (From admission, onward)   Start     Dose/Rate Route Frequency Ordered Stop   03/14/20 1330  amoxicillin-clavulanate (AUGMENTIN) 875-125 MG per tablet 1 tablet        1 tablet Oral Every 12 hours 03/14/20 1242 03/19/20 0959   03/12/20 1600  piperacillin-tazobactam (ZOSYN) IVPB 3.375 g  Status:  Discontinued        3.375 g 12.5 mL/hr over 240 Minutes Intravenous Every 8 hours 03/12/20 1538 03/14/20 1104       I have personally reviewed the following labs and images: CBC: Recent Labs  Lab 03/12/20 0843 03/13/20 0500 03/14/20 0456  WBC 17.5* 16.7* 13.3*  HGB 13.8 12.7* 11.9*  HCT 41.0 37.8* 36.5*  MCV 93.4 95.2 95.3  PLT 406* 435* 428*   BMP &GFR Recent Labs  Lab 03/12/20 0843 03/13/20 0500 03/14/20 0456  NA 135 138 137  K 4.0 4.2 3.9  CL 99 102 104  CO2 24 23 23   GLUCOSE 220* 169* 169*  BUN 10 14 8   CREATININE 0.69 0.84 0.76  CALCIUM 9.6 9.2 9.1   Estimated Creatinine Clearance: 148.9 mL/min (by C-G formula based on SCr of 0.76 mg/dL). Liver & Pancreas: Recent Labs  Lab 03/12/20 0843 03/13/20 0500 03/14/20 0456  AST 26 49* 35  ALT 55* 68* 66*  ALKPHOS 137* 138* 112  BILITOT 0.8 0.8 0.8  PROT 7.9 7.3 6.5  ALBUMIN 3.4* 3.0* 2.3*   Recent Labs  Lab 03/12/20 0843  LIPASE 24   No results for input(s): AMMONIA in the last 168 hours. Diabetic: Recent Labs    03/12/20 0843   HGBA1C 11.0*   Recent Labs  Lab 03/13/20 1134 03/13/20 1745 03/13/20 2108 03/14/20 0813 03/14/20 1104  GLUCAP 203* 173* 220* 142* 228*   Cardiac Enzymes: No results for input(s): CKTOTAL, CKMB, CKMBINDEX, TROPONINI in the last 168 hours. No results for input(s): PROBNP in the last 8760 hours. Coagulation Profile: No results for input(s): INR, PROTIME in the last 168 hours. Thyroid Function Tests: No results for input(s): TSH, T4TOTAL, FREET4, T3FREE, THYROIDAB in the last 72 hours. Lipid Profile: No results for input(s): CHOL, HDL, LDLCALC, TRIG, CHOLHDL, LDLDIRECT in the last 72 hours. Anemia Panel: No results for input(s): VITAMINB12, FOLATE, FERRITIN, TIBC, IRON, RETICCTPCT in the last 72 hours. Urine analysis:    Component Value Date/Time   COLORURINE YELLOW 03/12/2020 [place] 03/12/2020 1134   LABSPEC 1.013 03/12/2020 1134   PHURINE 5.0 03/12/2020 [place] 03/12/2020 1134   [place] 03/12/2020 1134   [place] 03/12/2020 1134  KETONESUR NEGATIVE 03/12/2020 1134   PROTEINUR 30 (A) 03/12/2020 1134   NITRITE NEGATIVE 03/12/2020 1134   LEUKOCYTESUR NEGATIVE 03/12/2020 1134   Sepsis Labs: Invalid input(s): PROCALCITONIN, [place]  Microbiology: Recent Results (from the past 240 hour(s))  SARS CORONAVIRUS 2 (TAT 6-24 HRS) Nasopharyngeal Nasopharyngeal Swab     Status: None   Collection Time: 03/12/20  1:24 PM   Specimen: Nasopharyngeal Swab  Result Value Ref Range Status   SARS Coronavirus 2 NEGATIVE NEGATIVE Final    Comment: (NOTE) SARS-CoV-2 target nucleic acids are NOT DETECTED.  The SARS-CoV-2 RNA is generally detectable in upper and lower respiratory specimens during the acute phase of infection. Negative results do not preclude SARS-CoV-2 infection, do not rule out co-infections with other pathogens, and should not be used as the sole basis for treatment or other patient management  decisions. Negative results must be combined with clinical observations, patient history, and epidemiological information. The expected result is Negative.  Fact Sheet for Patients: SugarRoll.be  Fact Sheet for Healthcare Providers: https://www.woods-mathews.com/  This test is not yet approved or cleared by the [place] FDA and  has been authorized for detection and/or diagnosis of SARS-CoV-2 by FDA under an Emergency Use Authorization (EUA). This EUA will remain  in effect (meaning this test can be used) for the duration of the COVID-19 declaration under Se ction 564(b)(1) of the Act, 21 U.S.C. section 360bbb-3(b)(1), unless the authorization is terminated or revoked sooner.  Performed at [facility], [place] [address], [place], [place] [address]     Radiology Studies: No results found.    Amely Voorheis T. Ronceverte  If 7PM-7AM, please contact night-coverage [email or web address] 03/14/2020, 1:28 PM

## 2020-03-15 ENCOUNTER — Other Ambulatory Visit (HOSPITAL_COMMUNITY): Payer: Self-pay | Admitting: Student

## 2020-03-15 ENCOUNTER — Encounter (HOSPITAL_COMMUNITY): Payer: Self-pay | Admitting: Internal Medicine

## 2020-03-15 DIAGNOSIS — E785 Hyperlipidemia, unspecified: Secondary | ICD-10-CM | POA: Diagnosis not present

## 2020-03-15 DIAGNOSIS — E1165 Type 2 diabetes mellitus with hyperglycemia: Secondary | ICD-10-CM | POA: Diagnosis not present

## 2020-03-15 DIAGNOSIS — I10 Essential (primary) hypertension: Secondary | ICD-10-CM | POA: Diagnosis not present

## 2020-03-15 DIAGNOSIS — I8290 Acute embolism and thrombosis of unspecified vein: Secondary | ICD-10-CM

## 2020-03-15 DIAGNOSIS — K55069 Acute infarction of intestine, part and extent unspecified: Secondary | ICD-10-CM | POA: Diagnosis not present

## 2020-03-15 HISTORY — DX: Acute embolism and thrombosis of unspecified vein: I82.90

## 2020-03-15 LAB — CBC WITH DIFFERENTIAL/PLATELET
Abs Immature Granulocytes: 0.18 10*3/uL — ABNORMAL HIGH (ref 0.00–0.07)
Basophils Absolute: 0.1 10*3/uL (ref 0.0–0.1)
Basophils Relative: 1 %
Eosinophils Absolute: 0.3 10*3/uL (ref 0.0–0.5)
Eosinophils Relative: 3 %
HCT: 33.9 % — ABNORMAL LOW (ref 39.0–52.0)
Hemoglobin: 11.6 g/dL — ABNORMAL LOW (ref 13.0–17.0)
Immature Granulocytes: 2 %
Lymphocytes Relative: 37 %
Lymphs Abs: 3.8 10*3/uL (ref 0.7–4.0)
MCH: 32.3 pg (ref 26.0–34.0)
MCHC: 34.2 g/dL (ref 30.0–36.0)
MCV: 94.4 fL (ref 80.0–100.0)
Monocytes Absolute: 0.9 10*3/uL (ref 0.1–1.0)
Monocytes Relative: 8 %
Neutro Abs: 5.2 10*3/uL (ref 1.7–7.7)
Neutrophils Relative %: 49 %
Platelets: 466 10*3/uL — ABNORMAL HIGH (ref 150–400)
RBC: 3.59 MIL/uL — ABNORMAL LOW (ref 4.22–5.81)
RDW: 14.4 % (ref 11.5–15.5)
WBC: 10.5 10*3/uL (ref 4.0–10.5)
nRBC: 0 % (ref 0.0–0.2)

## 2020-03-15 LAB — HEPARIN LEVEL (UNFRACTIONATED): Heparin Unfractionated: 0.75 IU/mL — ABNORMAL HIGH (ref 0.30–0.70)

## 2020-03-15 LAB — GLUCOSE, CAPILLARY
Glucose-Capillary: 157 mg/dL — ABNORMAL HIGH (ref 70–99)
Glucose-Capillary: 248 mg/dL — ABNORMAL HIGH (ref 70–99)

## 2020-03-15 MED ORDER — RIVAROXABAN 20 MG PO TABS
20.0000 mg | ORAL_TABLET | Freq: Every day | ORAL | 1 refills | Status: DC
Start: 2020-04-05 — End: 2020-03-15

## 2020-03-15 MED ORDER — RIVAROXABAN (XARELTO) VTE STARTER PACK (15 & 20 MG): ORAL_TABLET | ORAL | 0 refills | Status: DC

## 2020-03-15 MED ORDER — RIVAROXABAN 20 MG PO TABS: 20.0000 mg | ORAL_TABLET | Freq: Every day | ORAL | 0 refills | Status: DC

## 2020-03-15 MED ORDER — AMOXICILLIN-POT CLAVULANATE 875-125 MG PO TABS: 1.0000 | ORAL_TABLET | Freq: Two times a day (BID) | ORAL | 0 refills | Status: DC

## 2020-03-15 MED FILL — AMOX-CLAV 875-125 MG TABLET: 875-125 | 3 days supply | Qty: 6 | Fill #0

## 2020-03-15 MED FILL — XARELTO STARTER PACK: 15 & 20 | 30 days supply | Qty: 51 | Fill #0

## 2020-03-15 NOTE — Discharge Summary (Signed)
Physician Discharge Summary  Riley Wiggins YFV:[id number] DOB: 1965/04/23 DOA: 03/12/2020  PCP: Gildardo Pounds, NP  Admit date: 03/12/2020 Discharge date: 03/15/2020  Admitted From: Home Disposition: Home  Recommendations for Outpatient Follow-up:  1. Follow ups as below. 2. Please obtain CBC/BMP/Mag at follow up 3. Please follow up on the following pending results: None  Home Health: None required Equipment/Devices: None required  Discharge Condition: Stable CODE STATUS: Full code   Follow-up Information    Gildardo Pounds, NP. Schedule an appointment as soon as possible for a visit in 1 week(s).   Specialty: Nurse Practitioner Contact information: [place] [address] [phone]                Hospital Course: 55 year old male with PMH of DM-2, HTN and HLD presented to [facility] ED with episodic cramping abdominal pain for 3 days, and found to have acute thrombosis of SMV branch to RLQ with surrounding inflammatory changes and enlarged mesenteric lymph nodes.  Vascular surgery consulted, and he was started on IV heparin and transferred to [facility] for close follow-up.  He was also started on IV Zosyn for SIRS with fever and leukocytosis.  His pro Cal was elevated.  Vascular surgery recommended 6 months of anticoagulation.  Patient does anticoagulated with IV heparin and transitioned to Xarelto on the day of discharge.  In regards to SIRS, source of infection is unclear.  He was transitioned from IV Zosyn to p.o. Augmentin and discharged on the later for 3 more days.   At the time of discharge, his leukocytosis, fever and abdominal pain resolved.  He tolerated soft diet.  Cleared for discharge by vascular surgery.  He was encouraged to have age-appropriate health screening including colonoscopy.   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 -Transitioned to p.o. Xarelto on discharge on this.   -Needs age-appropriate screening including colonoscopy  SIRS:has fever and leukocytosis but no clear source of infection.  Procalcitonin elevated.   SIRS resolved. -Received IV Zosyn and transitioned to p.o. Augmentin -Discharged on p.o. Augmentin for 3 more days.  Transaminitis: Mild elevated AST and ALT.  Bilirubin within normal.  RUQ [place] notable for hepatic steatosis but no cholecystitis.  Improved. -Recheck CMP at follow-up  Uncontrolled NIDDM-2 with hyperglycemia: A1c 11.0% (from 13.16 weeks prior)..  On Lantus 16 units daily, Metformin and glimepiride at home. -Continue home medications including statin.  Essential hypertension: Normotensive. -Continue holding home losartan.   Body mass index is 33.75 kg/m.            Discharge Exam: Vitals:   03/15/20 0340 03/15/20 0800  BP: 140/88 (!) 146/93  Pulse: 66 71  Resp: 15 16  Temp: 98.3 F (36.8 C) 98.1 F (36.7 C)  SpO2: 100% 96%    GENERAL: No apparent distress.  Nontoxic. HEENT: MMM.  Vision and hearing grossly intact.  NECK: Supple.  No apparent JVD.  RESP:  No IWOB.  Fair aeration bilaterally. CVS:  RRR. Heart sounds normal.  ABD/GI/GU: Bowel sounds present. Soft. Non tender.  MSK/EXT:  Moves extremities. No apparent deformity. No edema.  SKIN: no apparent skin lesion or wound NEURO: Awake, alert and oriented appropriately.  No apparent focal neuro deficit. PSYCH: Calm. Normal affect.  Discharge Instructions  Discharge Instructions    Diet - low sodium heart healthy   Complete by: As directed  Diet Carb Modified   Complete by: As directed    Discharge instructions   Complete by: As directed    It has been a pleasure taking care of you!  You were hospitalized due to abdominal pain and diagnosed with blood clot in one of the blood vessels in your abdomen.  The cause of this is unclear.  We have started  you on blood thinner and discharging you on Xarelto that you need to continue taking at least for the next 6 months.  It is very important that you avoid over-the-counter pain medications except plain Tylenol while taking Xarelto.  Please follow-up with your primary care doctor in 1 to 2 weeks.    Take care,   Increase activity slowly   Complete by: As directed      Allergies as of 03/15/2020   No Known Allergies     Medication List    STOP taking these medications   aspirin 81 MG chewable tablet   ibuprofen 200 MG tablet Commonly known as: ADVIL     TAKE these medications   acetaminophen 500 MG tablet Commonly known as: TYLENOL Take 1,000 mg by mouth every 6 (six) hours as needed for mild pain.   allopurinol 100 MG tablet Commonly known as: ZYLOPRIM Take 1 tablet (100 mg total) by mouth daily.   amoxicillin-clavulanate 875-125 MG tablet Commonly known as: AUGMENTIN Take 1 tablet by mouth every 12 (twelve) hours for 3 days.   atorvastatin 40 MG tablet Commonly known as: LIPITOR Take 1 tablet (40 mg total) by mouth daily.   Garlic 0102 MG Caps Take 1,200 mg by mouth daily.   glimepiride 2 MG tablet Commonly known as: AMARYL Take 1 tablet (2 mg total) by mouth daily with breakfast.   Lantus SoloStar 100 UNIT/ML Solostar Pen Generic drug: insulin glargine Inject 16 Units into the skin daily.   losartan 50 MG tablet Commonly known as: COZAAR Take 1 tablet (50 mg total) by mouth daily.   metFORMIN 500 MG 24 hr tablet Commonly known as: GLUCOPHAGE-XR Take 1 tablet (500 mg total) by mouth 3 (three) times daily.   Multi For Him 50+ Tabs Take 1 tablet by mouth daily.   Rivaroxaban Stater Pack (15 mg and 20 mg) Commonly known as: XARELTO STARTER PACK Follow package directions: Take one $Remove'15mg'XWQinnR$  tablet by mouth twice a day. On day 22, switch to one $Remo'20mg'dXwzL$  tablet once a day. Take with food.   rivaroxaban 20 MG Tabs tablet Commonly known as: XARELTO Take 1 tablet (20 mg  total) by mouth daily with supper. Start taking on: April 07, 2020   True Metrix Blood Glucose Test test strip Generic drug: glucose blood Use as instructed. Check blood glucose level by fingerstick twice per day. E11.65   True Metrix Meter w/Device Kit 1 each by Does not apply route in the morning and at bedtime.   TRUEplus Lancets 28G Misc 1 each by Does not apply route in the morning and at bedtime.   vitamin C 1000 MG tablet Take 1,000 mg by mouth daily.       Consultations:  Vascular surgery  Procedures/Studies:   CT ABDOMEN PELVIS W CONTRAST  Result Date: 03/12/2020 CLINICAL DATA:  Acute generalized abdominal pain. EXAM: CT ABDOMEN AND PELVIS WITH CONTRAST TECHNIQUE: Multidetector CT imaging of the abdomen and pelvis was performed using the standard protocol following bolus administration of intravenous contrast. CONTRAST:  115mL OMNIPAQUE IOHEXOL 300 MG/ML  SOLN COMPARISON:  August 03, 2019. FINDINGS: Lower  chest: No acute abnormality. Hepatobiliary: No focal liver abnormality is seen. No gallstones, gallbladder wall thickening, or biliary dilatation. Pancreas: Unremarkable. No pancreatic ductal dilatation or surrounding inflammatory changes. Spleen: Normal in size without focal abnormality. Adrenals/Urinary Tract: Adrenal glands appear normal. Small nonobstructive right renal calculus is noted. No hydronephrosis or renal obstruction is noted. Urinary bladder is unremarkable. Stomach/Bowel: Stomach is within normal limits. Appendix appears normal. No evidence of bowel wall thickening, distention, or inflammatory changes. Vascular/Lymphatic: There appears to be acute thrombosis involving a branch of the superior mesenteric vein seen in the right lower quadrant, with surrounding inflammatory changes. There are enlarged mesenteric lymph nodes in this area as well. Atherosclerosis of abdominal aorta is noted without aneurysm formation. Reproductive: Prostate is unremarkable. Other: No  abdominal wall hernia or abnormality. No abdominopelvic ascites. Musculoskeletal: No acute or significant osseous findings. IMPRESSION: 1. There appears to be acute thrombosis involving a branch of the superior mesenteric vein seen in the right lower quadrant, with surrounding inflammatory changes. There are enlarged mesenteric lymph nodes in this area as well. Critical Value/emergent results were called by telephone at the time of interpretation on 03/12/2020 at 12:59 pm to provider [facility] , who verbally acknowledged these results. 2. Small nonobstructive right renal calculus. No hydronephrosis or renal obstruction is noted. 3. Aortic atherosclerosis. Aortic Atherosclerosis (ICD10-I70.0). Electronically Signed   By: Marijo Conception M.D.   On: 03/12/2020 12:59   US Abdomen Limited  Result Date: 03/12/2020 CLINICAL DATA:  Generalized abdominal pain for 3-4 days. EXAM: ULTRASOUND ABDOMEN LIMITED RIGHT UPPER QUADRANT COMPARISON:  September 02, 2019 FINDINGS: Gallbladder: No gallstones or wall thickening visualized. Layering gallbladder sludge. No sonographic Murphy sign noted by sonographer. Common bile duct: Diameter: 3 mm Liver: No focal lesion identified. Heterogeneous parenchymal echogenicity. Portal vein is patent on color Doppler imaging with normal direction of blood flow towards the liver. Other: None. IMPRESSION: Layering gallbladder sludge, without sonographic evidence of acute cholecystitis. Heterogeneous echotexture of the liver, usually seen with hepatic steatosis. Electronically Signed   By: Fidela Salisbury M.D.   On: 03/12/2020 11:23        The results of significant diagnostics from this hospitalization (including imaging, microbiology, ancillary and laboratory) are listed below for reference.     Microbiology: Recent Results (from the past 240 hour(s))  SARS CORONAVIRUS 2 (TAT 6-24 HRS) Nasopharyngeal Nasopharyngeal Swab     Status: None   Collection Time: 03/12/20  1:24 PM    Specimen: Nasopharyngeal Swab  Result Value Ref Range Status   SARS Coronavirus 2 NEGATIVE NEGATIVE Final    Comment: (NOTE) SARS-CoV-2 target nucleic acids are NOT DETECTED.  The SARS-CoV-2 RNA is generally detectable in upper and lower respiratory specimens during the acute phase of infection. Negative results do not preclude SARS-CoV-2 infection, do not rule out co-infections with other pathogens, and should not be used as the sole basis for treatment or other patient management decisions. Negative results must be combined with clinical observations, patient history, and epidemiological information. The expected result is Negative.  Fact Sheet for Patients: SugarRoll.be  Fact Sheet for Healthcare Providers: https://www.woods-mathews.com/  This test is not yet approved or cleared by the [place] FDA and  has been authorized for detection and/or diagnosis of SARS-CoV-2 by FDA under an Emergency Use Authorization (EUA). This EUA will remain  in effect (meaning this test can be used) for the duration of the COVID-19 declaration under Se ction 564(b)(1) of the Act, 21 U.S.C. section 360bbb-3(b)(1), unless the  authorization is terminated or revoked sooner.  Performed at [facility], [place] [address] [place],  [address]      Labs:  CBC: Recent Labs  Lab 03/12/20 [phone] 03/13/20 0500 03/14/20 0456 03/15/20 0148  WBC 17.5* 16.7* 13.3* 10.5  NEUTROABS  --   --   --  5.2  HGB 13.8 12.7* 11.9* 11.6*  HCT 41.0 37.8* 36.5* 33.9*  MCV 93.4 95.2 95.3 94.4  PLT 406* 435* 428* 466*   BMP &GFR Recent Labs  Lab 03/12/20 0843 03/13/20 0500 03/14/20 0456  NA 135 138 137  K 4.0 4.2 3.9  CL 99 102 104  CO2 $Re'24 23 23  '[place]oy$ GLUCOSE 220* 169* 169*  BUN $Re'10 14 8  'aCb$ CREATININE 0.69 0.84 0.76  CALCIUM 9.6 9.2 9.1   Estimated Creatinine Clearance: 148.9 mL/min (by C-G formula based on SCr of 0.76 mg/dL). Liver & Pancreas: Recent  Labs  Lab 03/12/20 0843 03/13/20 0500 03/14/20 0456  AST 26 49* 35  ALT 55* 68* 66*  ALKPHOS 137* 138* 112  BILITOT 0.8 0.8 0.8  PROT 7.9 7.3 6.5  ALBUMIN 3.4* 3.0* 2.3*   Recent Labs  Lab 03/12/20 0843  LIPASE 24   No results for input(s): AMMONIA in the last 168 hours. Diabetic: No results for input(s): HGBA1C in the last 72 hours. Recent Labs  Lab 03/14/20 0813 03/14/20 1104 03/14/20 1608 03/14/20 2155 03/15/20 0624  GLUCAP 142* 228* 189* 190* 157*   Cardiac Enzymes: No results for input(s): CKTOTAL, CKMB, CKMBINDEX, TROPONINI in the last 168 hours. No results for input(s): PROBNP in the last 8760 hours. Coagulation Profile: No results for input(s): INR, PROTIME in the last 168 hours. Thyroid Function Tests: No results for input(s): TSH, T4TOTAL, FREET4, T3FREE, THYROIDAB in the last 72 hours. Lipid Profile: No results for input(s): CHOL, HDL, LDLCALC, TRIG, CHOLHDL, LDLDIRECT in the last 72 hours. Anemia Panel: No results for input(s): VITAMINB12, FOLATE, FERRITIN, TIBC, IRON, RETICCTPCT in the last 72 hours. Urine analysis:    Component Value Date/Time   COLORURINE YELLOW 03/12/2020 [place] 03/12/2020 1134   LABSPEC 1.013 03/12/2020 1134   PHURINE 5.0 03/12/2020 1134   GLUCOSEU NEGATIVE 03/12/2020 1134   [place] 03/12/2020 1134   [place] 03/12/2020 1134   [place] 03/12/2020 1134   PROTEINUR 30 (A) 03/12/2020 1134   NITRITE NEGATIVE 03/12/2020 1134   LEUKOCYTESUR NEGATIVE 03/12/2020 1134   Sepsis Labs: Invalid input(s): PROCALCITONIN, LACTICIDVEN   Time coordinating discharge: 35 minutes  SIGNED:  Mercy Riding, MD  [facility] 03/15/2020, 10:03 AM  If 7PM-7AM, please contact night-coverage [email or web address]

## 2020-03-15 NOTE — Progress Notes (Addendum)
[place] for IV heparin >>Rivaroxaban Indication: superior mesenteric vein thrombosis  No Known Allergies  Patient Measurements: Height: 6\' 3"  (190.5 cm) Weight: 122.5 kg (270 lb) IBW/kg (Calculated) : 84.5 Heparin Dosing Weight: 110.7 kg   Vital Signs: Temp: 98.3 F (36.8 C) (02/08 0340) Temp Source: Oral (02/08 0340) BP: 140/88 (02/08 0340) Pulse Rate: 66 (02/08 0340)  Labs: Recent Labs    03/12/20 0843 03/12/20 2100 03/13/20 0500 03/13/20 1135 03/13/20 2157 03/14/20 0456 03/15/20 0148  HGB 13.8  --  12.7*  --   --  11.9* 11.6*  HCT 41.0  --  37.8*  --   --  36.5* 33.9*  PLT 406*  --  435*  --   --  428* 466*  HEPARINUNFRC  --    < >  --    < > 0.29* 0.51 0.75*  CREATININE 0.69  --  0.84  --   --  0.76  --    < > = values in this interval not displayed.    Estimated Creatinine Clearance: 148.9 mL/min (by C-G formula based on SCr of 0.76 mg/dL).   Assessment: Pharmacy is consulted to switch heparin to rivaroxaban infusion in this 55 yo male diagnosed with superior mesenteric vein thrombosis. No anticoagulation PTA.   Heparin level was up to slightly supratherapeutic (0.75) on gtt at 2950 units/hr. No bleeding noted. Heparin was decreased to 2800 units/hr. Will switch to rivaroxaban. Patient has [employer] and [facility] discount cards that need to be used within their pharmacies. [facility] can provide a first month free card but subsequent refills need to be sent to [facility] so they can help him with patient assistance. Discussed with MD who agrees.   Goal of Therapy:  Monitor platelets by anticoagulation protocol: Yes   Plan:  Stop heparin Start rivaroxaban 15mg  BID x21 days then 20 mg daily with food Monitor s/sx bleeding For discharge, sent first Rx to [facility] and 2 refills to [facility] so they can start working on patient assistance for him   Benetta Spar, PharmD, BCPS,  [place] Clinical Pharmacist  Please check AMION for all [place] phone numbers After 10:00 PM, call [place] [phone]

## 2020-03-15 NOTE — Progress Notes (Signed)
ANTICOAGULATION CONSULT NOTE   Pharmacy Consult for IV heparin  Indication: superior mesenteric vein thrombosis  No Known Allergies  Patient Measurements: Height: 6\' 3"  (190.5 cm) Weight: 122.5 kg (270 lb) IBW/kg (Calculated) : 84.5 Heparin Dosing Weight: 110.7 kg   Vital Signs: Temp: 98.3 F (36.8 C) (02/07 2342) Temp Source: Oral (02/07 2342) BP: 118/74 (02/07 2342) Pulse Rate: 69 (02/07 2342)  Labs: Recent Labs    03/12/20 0843 03/12/20 2100 03/13/20 0500 03/13/20 1135 03/13/20 2157 03/14/20 0456 03/15/20 0148  HGB 13.8  --  12.7*  --   --  11.9* 11.6*  HCT 41.0  --  37.8*  --   --  36.5* 33.9*  PLT 406*  --  435*  --   --  428* 466*  HEPARINUNFRC  --    < >  --    < > 0.29* 0.51 0.75*  CREATININE 0.69  --  0.84  --   --  0.76  --    < > = values in this interval not displayed.    Estimated Creatinine Clearance: 148.9 mL/min (by C-G formula based on SCr of 0.76 mg/dL).   Assessment: Pharmacy is consulted to dose heparin infusion in 55 yo male diagnosed with superior mesenteric vein thrombosis. No anticoagulation PTA.   Heparin level up to slightly supratherapeutic (0.75) on gtt at 2950 units/hr. No bleeding noted.  Goal of Therapy:  Heparin level 0.3-0.7 units/ml Monitor platelets by anticoagulation protocol: Yes   Plan:  Decrease heparin drip to 2800 units/hr  Will f/u 6 hr heparin level  Sherlon Handing, PharmD, BCPS Please see amion for complete clinical pharmacist phone list 03/15/2020 2:44 AM

## 2020-03-15 NOTE — Discharge Instructions (Signed)
Information on my medicine - XARELTO (rivaroxaban)  This medication education was reviewed with me or my healthcare representative as part of my discharge preparation.  WHY WAS XARELTO PRESCRIBED FOR YOU? Xarelto was prescribed to treat blood clots that may have been found in the veins of your legs (deep vein thrombosis) or in your lungs (pulmonary embolism) and to reduce the risk of them occurring again.  What do you need to know about Xarelto? The starting dose is one 15 mg tablet taken TWICE daily with food for the FIRST 21 DAYS then on (enter date)  04/05/20  the dose is changed to one 20 mg tablet taken ONCE A DAY with your evening meal.  DO NOT stop taking Xarelto without talking to the health care provider who prescribed the medication.  Refill your prescription for 20 mg tablets before you run out.  After discharge, you should have regular check-up appointments with your healthcare provider that is prescribing your Xarelto.  In the future your dose may need to be changed if your kidney function changes by a significant amount.  What do you do if you miss a dose? If you are taking Xarelto TWICE DAILY and you miss a dose, take it as soon as you remember. You may take two 15 mg tablets (total 30 mg) at the same time then resume your regularly scheduled 15 mg twice daily the next day.  If you are taking Xarelto ONCE DAILY and you miss a dose, take it as soon as you remember on the same day then continue your regularly scheduled once daily regimen the next day. Do not take two doses of Xarelto at the same time.   Important Safety Information Xarelto is a blood thinner medicine that can cause bleeding. You should call your healthcare provider right away if you experience any of the following: ? Bleeding from an injury or your nose that does not stop. ? Unusual colored urine (red or dark brown) or unusual colored stools (red or black). ? Unusual bruising for unknown reasons. ? A  serious fall or if you hit your head (even if there is no bleeding).  Some medicines may interact with Xarelto and might increase your risk of bleeding while on Xarelto. To help avoid this, consult your healthcare provider or pharmacist prior to using any new prescription or non-prescription medications, including herbals, vitamins, non-steroidal anti-inflammatory drugs (NSAIDs) and supplements.  This website has more information on Xarelto: [email or web address]-benson.[email or web address]

## 2020-03-15 NOTE — Progress Notes (Addendum)
Progress Note    03/15/2020 7:39 AM Hospital Day 2  Subjective:  Says he doesn't know why they changed his food that he was eating ok and having bowel movements.  He denies any pain, nausea and vomiting.    Afebrile HR 60's-70's  419'Q-222'L systolic 79% RA  Vitals:   03/14/20 2342 03/15/20 0340  BP: 118/74 140/88  Pulse: 69 66  Resp: 17 15  Temp: 98.3 F (36.8 C) 98.3 F (36.8 C)  SpO2: 99% 100%    Physical Exam: General:  Sleeping; wakes easily Lungs:  Non labored Abdomen:  Soft, NT/ND; +BM   CBC    Component Value Date/Time   WBC 10.5 03/15/2020 0148   RBC 3.59 (L) 03/15/2020 0148   HGB 11.6 (L) 03/15/2020 0148   HGB 14.8 09/23/2019 1107   HCT 33.9 (L) 03/15/2020 0148   HCT 42.3 09/23/2019 1107   PLT 466 (H) 03/15/2020 0148   PLT 290 09/23/2019 1107   MCV 94.4 03/15/2020 0148   MCV 96 09/23/2019 1107   MCV 101 (H) 05/10/2014 1321   MCH 32.3 03/15/2020 0148   MCHC 34.2 03/15/2020 0148   RDW 14.4 03/15/2020 0148   RDW 12.8 09/23/2019 1107   RDW 12.9 05/10/2014 1321   LYMPHSABS 3.8 03/15/2020 0148   LYMPHSABS 3.8 (H) 09/23/2019 1107   LYMPHSABS 2.3 05/10/2014 1321   MONOABS 0.9 03/15/2020 0148   MONOABS 1.2 (H) 05/10/2014 1321   EOSABS 0.3 03/15/2020 0148   EOSABS 0.2 09/23/2019 1107   EOSABS 0.3 05/10/2014 1321   BASOSABS 0.1 03/15/2020 0148   BASOSABS 0.1 09/23/2019 1107   BASOSABS 0 05/10/2014 1444   BASOSABS 0.1 05/10/2014 1321    BMET    Component Value Date/Time   NA 137 03/14/2020 0456   NA 140 02/03/2020 1616   NA 136 05/10/2014 1321   K 3.9 03/14/2020 0456   K 3.8 05/10/2014 1321   CL 104 03/14/2020 0456   CL 96 (L) 05/10/2014 1321   CO2 23 03/14/2020 0456   CO2 29 05/10/2014 1321   GLUCOSE 169 (H) 03/14/2020 0456   GLUCOSE 145 (H) 05/10/2014 1321   BUN 8 03/14/2020 0456   BUN 14 02/03/2020 1616   BUN 13 05/10/2014 1321   CREATININE 0.76 03/14/2020 0456   CREATININE 0.76 05/10/2014 1321   CALCIUM 9.1 03/14/2020 0456    CALCIUM 10.3 05/10/2014 1321   GFRNONAA >60 03/14/2020 0456   GFRNONAA >60 05/10/2014 1321   GFRAA 114 02/03/2020 1616   GFRAA >60 05/10/2014 1321    INR No results found for: INR   Intake/Output Summary (Last 24 hours) at 03/15/2020 0739 Last data filed at 03/15/2020 8921 Gross per 24 hour  Intake 885.5 ml  Output 1325 ml  Net -439.5 ml     Assessment/Plan:  55 y.o. male thatpresents with mesenteric venous thrombosis withacute thrombus in a branch ofthe superior mesenteric vein.  Hospital Day 2  -pt afebrile; leukocytosis continues to improve.  Thrombocytosis continues to increase with platelet count today of 466k.  Continue anticoagulation.  Will need transition to oral AC.  Looking at TOC's note, Xarelto and Eliquis are covered but she did not put any pricing.  I put in an order to check what pt will pay for each one.   -pt without abdominal pain, N/V and tolerating food without difficulty.   Addendum:  Pharmacy called.  Pt does not have insurance.  She states that he would get the Xarelto card providing the first month  free and then the [facility] would work with him on additional prescriptions after that.     Leontine Locket, PA-C [facility] [phone] 03/15/2020 7:39 AM   I have seen and evaluated the patient. I agree with the PA note as documented above.  55 year old male seen for superior mesenteric vein branch thrombosis.  He looks better, white count is back to normal, and not having any significant abdominal pain on exam.  He needs 6 months of anticoagulation and can be switched to an oral agent.  Discussed it can be whatever he can afford.  Marty Heck, MD [facility] [place] Office: [phone]

## 2020-03-15 NOTE — TOC Transition Note (Signed)
Transition of Care (TOC) - CM/SW Discharge Note Marvetta Gibbons RN, BSN Transitions of Care Unit 4E- RN Case Manager See Treatment Team for direct phone #    Patient Details  Name: Riley Wiggins MRN: [id number] Date of Birth: [birth year]  Transition of Care [facility]) CM/SW Contact:  Dawayne Patricia, RN Phone Number: 03/15/2020, 10:04 AM   Clinical Narrative:    Pt stable for transition home today, plan to start Xarelto. Per benefits check yesterday- pt does not have pharmacy coverage. CM called pt at bedside to confirm. Per pt he goes to the [facility] and uses their pharmacy to fill his medications. He has discount card with [facility] pharmacy and gets his meds through their discounted cost program.  Call made to the the [facility] pharmacy and confirmed with them that pt does not have insurance drug coverage however uses their discount cost program- their drug assistance coordinator will assist pt with the Xarelto patient assistance program application.   [facility] pharmacy will fill first 30 day free prior to discharge and deliver to the bedside.  Patient to f/u with [facility].      Final next level of care: Home/Self Care Barriers to Discharge: No Barriers Identified   Patient Goals and CMS Choice Patient states their goals for this hospitalization and ongoing recovery are:: return home   Choice offered to / list presented to : NA  Discharge Placement                 Home      Discharge Plan and Services   Discharge Planning Services: CM Consult,Medication Assistance Post Acute Care Choice: NA          DME Arranged: N/A DME Agency: NA       HH Arranged: NA HH Agency: NA        Social Determinants of Health (SDOH) Interventions     Readmission Risk Interventions Readmission Risk Prevention Plan 03/15/2020  Post Dischage Appt Complete  Medication Screening Complete  Transportation Screening Complete  Some recent data might be hidden

## 2020-03-16 ENCOUNTER — Telehealth: Payer: Self-pay

## 2020-03-16 NOTE — Telephone Encounter (Signed)
Transition Care Management Unsuccessful Follow-up Telephone Call  Date of discharge and from where:  [facility] on 03/15/2020  Attempts: 1st call  Reason for unsuccessful TCM follow-up call:Called pt on both listed phone nr . Unable to reach, left VM to call back this nurse. Phone and contact provided.  Pt has scheduled appt at [facility] with PA Freeman Caldron on 03/17/20

## 2020-03-16 NOTE — Progress Notes (Signed)
Patient ID: Riley Wiggins, male   DOB: 1965-12-20, 55 y.o.   MRN: [id number]     Riley Wiggins, is a 55 y.o. male  AUQ:[id number]  [id number]:[id number]  DOB - [birth year]  Subjective:  Chief Complaint and HPI: Riley Wiggins is a 55 y.o. male here today for a follow up visit After hospitalization 2/5-03/15/2020 for mesenteric artery occlusion.  He is doing well.  No fever.  Slight diarrhea from antibiotics.  No N/V/C.  No fever and bowels moving well.  No further abdominal pain.  He was able to get his xarelto and the plan is to take it for 6 months.  Needs GI referral.  Husband and wife talk over each other  Blood sugars running 160-200s.  A1C=11.0 which he and his wife feel like is adequate.  He says he is compliant on 16 units lantus, amaryl and metformin.  Cholesterol meds being held due to increased LFT.     From discharge summary: Hospital Course: 55 year old male with PMH of DM-2, HTN and HLD presented to [facility] ED with episodic cramping abdominal pain for 3 days, andfound to have acute thrombosis of SMVbranch to RLQ with surrounding inflammatory changes and enlarged mesenteric lymph nodes. Vascular surgery consulted, andhe was started on IV heparin and transferred to [facility] for close follow-up.He was also started on IV Zosyn for SIRSwith fever and leukocytosis.His pro Cal was elevated. Vascular surgery recommended 6 months of anticoagulation.  Patient does anticoagulated with IV heparin and transitioned to Xarelto on the day of discharge.  In regards to SIRS, source of infection is unclear.  He was transitioned from IV Zosyn to p.o. Augmentin and discharged on the later for 3 more days.   At the time of discharge, his leukocytosis, fever and abdominal pain resolved.  He tolerated soft diet.  Cleared for discharge by vascular surgery.  He was encouraged to have age-appropriate health screening including colonoscopy.   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 -Transitioned to p.o. Xarelto on discharge on this.   -Needs age-appropriate screening including colonoscopy  SIRS:has fever and leukocytosisbut noclear source of infection.Procalcitonin elevated.  SIRS resolved. -Received IV Zosyn and transitioned to p.o. Augmentin -Discharged on p.o. Augmentin for 3 more days.  Transaminitis: Mild elevated AST and ALT. Bilirubin within normal. RUQ [place] notable for hepatic steatosis but no cholecystitis.  Improved. -Recheck CMP at follow-up  Uncontrolled NIDDM-2 with hyperglycemia: A1c 11.0% (from 13.16 weeks prior).. On Lantus 16 units daily, Metformin and glimepiride at home. -Continue home medications including statin.  Essential hypertension: Normotensive. -Continue holding home losartan.    ED/Hospital notes reviewed.   Social History: Family history:  ROS:   Constitutional:  No f/c, No night sweats, No unexplained weight loss. EENT:  No vision changes, No blurry vision, No hearing changes. No mouth, throat, or ear problems.  Respiratory: No cough, No SOB Cardiac: No CP, no palpitations GI:  No abd pain, No N/V/D. GU: No Urinary s/sx Musculoskeletal: No joint pain Neuro: No headache, no dizziness, no motor weakness.  Skin: No rash Endocrine:  No polydipsia. No polyuria.  Psych: Denies SI/HI  No problems updated.  ALLERGIES: No Known Allergies  PAST MEDICAL HISTORY: Past Medical History:  Diagnosis Date  . Diabetes mellitus without complication ([place])   . Dyslipidemia   . Hyperlipidemia   . Hypertension   . Thrombosis 03/15/2020   acute thrombosis of SMV  branch to RLQ with surrounding inflammatory changes and enlarged mesenteric lymph nodes.     MEDICATIONS AT HOME: Prior to Admission medications   Medication Sig Start Date End Date Taking? Authorizing Provider  acetaminophen (TYLENOL)  500 MG tablet Take 1,000 mg by mouth every 6 (six) hours as needed for mild pain.   Yes [provider]  allopurinol (ZYLOPRIM) 100 MG tablet Take 1 tablet (100 mg total) by mouth daily. 02/03/20 05/03/20 Yes Lianni Kanaan, Dionne Bucy, PA-C  amoxicillin-clavulanate (AUGMENTIN) 875-125 MG tablet Take 1 tablet by mouth every 12 (twelve) hours for 3 days. 03/15/20 03/18/20 Yes Mercy Riding, MD  Ascorbic Acid (VITAMIN C) 1000 MG tablet Take 1,000 mg by mouth daily.   Yes [provider]  atorvastatin (LIPITOR) 40 MG tablet Take 1 tablet (40 mg total) by mouth daily. 02/03/20 05/03/20 Yes Brookelin Felber, Dionne Bucy, PA-C  Blood Glucose Monitoring Suppl (TRUE METRIX METER) w/Device KIT 1 each by Does not apply route in the morning and at bedtime. 02/03/20  Yes Argentina Donovan, PA-C  Garlic 3154 MG CAPS Take 1,200 mg by mouth daily.   Yes [provider]  glucose blood (TRUE METRIX BLOOD GLUCOSE TEST) test strip Use as instructed. Check blood glucose level by fingerstick twice per day. E11.65 02/03/20  Yes Haralambos Yeatts M, PA-C  Insulin Pen Needle (PEN NEEDLES) 30G X 8 MM MISC 1 each by Does not apply route daily. 03/17/20  Yes Layni Kreamer, Dionne Bucy, PA-C  Multiple Vitamins-Minerals (MULTI FOR HIM 50+) TABS Take 1 tablet by mouth daily.   Yes [provider]  rivaroxaban (XARELTO) 20 MG TABS tablet Take 1 tablet (20 mg total) by mouth daily with supper. 03/17/20  Yes Coralee Edberg, Levada Dy M, PA-C  RIVAROXABAN Alveda Reasons) VTE STARTER PACK (15 & 20 MG) Follow package directions: Take one $Remove'15mg'SSAtvXv$  tablet by mouth twice a day. On day 22, switch to one $Remo'20mg'pvZrm$  tablet once a day. Take with food. 03/15/20  Yes Mercy Riding, MD  TRUEplus Lancets 28G MISC 1 each by Does not apply route in the morning and at bedtime. 02/03/20  Yes Freeman Caldron M, PA-C  glimepiride (AMARYL) 2 MG tablet Take 1 tablet (2 mg total) by mouth daily with breakfast. 03/17/20   Argentina Donovan, PA-C  insulin glargine (LANTUS SOLOSTAR) 100  UNIT/ML Solostar Pen Inject 18 Units into the skin daily. 03/17/20   Argentina Donovan, PA-C  losartan (COZAAR) 50 MG tablet Take 1 tablet (50 mg total) by mouth daily. 03/17/20   Argentina Donovan, PA-C  metFORMIN (GLUCOPHAGE-XR) 500 MG 24 hr tablet Take 1 tablet (500 mg total) by mouth 3 (three) times daily. 03/17/20 04/16/20  Argentina Donovan, PA-C     Objective:  EXAM:   Vitals:   03/17/20 0911  BP: 104/73  Pulse: 88  SpO2: 99%  Weight: 260 lb 12.8 oz (118.3 kg)    General appearance : A&OX3. NAD. Non-toxic-appearing HEENT: Atraumatic and Normocephalic.  PERRLA. EOM intact.   Chest/Lungs:  Breathing-non-labored, Good air entry bilaterally, breath sounds normal without rales, rhonchi, or wheezing  CVS: S1 S2 regular, no murmurs, gallops, rubs  Abdomen: Bowel sounds present, Non tender and not distended with no gaurding, rigidity or rebound. Extremities: Bilateral Lower Ext shows no edema, both legs are warm to touch with = pulse throughout Neurology:  CN II-XII grossly intact, Non focal.   Psych:  TP scattered. J/I fair. Normal speech. Appropriate eye contact and affect.  Skin:  No Rash  Data  Review Lab Results  Component Value Date   HGBA1C 11.0 (H) 03/12/2020   HGBA1C 13.1 (A) 02/03/2020   HGBA1C 10.2 (H) 08/04/2019     Assessment & Plan   1. Type 2 diabetes mellitus with hyperglycemia, without long-term current use of insulin (HCC) Uncontrolled-increase lantus from 16 to 18 units and check blood sugars 2 to 3 times daily and record.  Work on diabetic diet - Glucose (CBG) - Comprehensive metabolic panel - metFORMIN (GLUCOPHAGE-XR) 500 MG 24 hr tablet; Take 1 tablet (500 mg total) by mouth 3 (three) times daily.  Dispense: 90 tablet; Refill: 1 - glimepiride (AMARYL) 2 MG tablet; Take 1 tablet (2 mg total) by mouth daily with breakfast.  Dispense: 90 tablet; Refill: 1 - insulin glargine (LANTUS SOLOSTAR) 100 UNIT/ML Solostar Pen; Inject 18 Units into the skin daily.   Dispense: 15 mL; Refill: PRN - Insulin Pen Needle (PEN NEEDLES) 30G X 8 MM MISC; 1 each by Does not apply route daily.  Dispense: 100 each; Refill: 5  2. Elevated LFTs Hold statin - Ambulatory referral to Gastroenterology - CBC with Differential/Platelet  3. Essential hypertension Controlled-continue - Comprehensive metabolic panel - losartan (COZAAR) 50 MG tablet; Take 1 tablet (50 mg total) by mouth daily.  Dispense: 90 tablet; Refill: 3  4. Dyslipidemia, goal LDL below 70 See #2  5. Superior mesenteric vein thrombosis (HCC) - rivaroxaban (XARELTO) 20 MG TABS tablet; Take 1 tablet (20 mg total) by mouth daily with supper.  Dispense: 30 tablet; Refill: 4 - Ambulatory referral to Gastroenterology - CBC with Differential/Platelet  6. Screening for colon cancer - Ambulatory referral to Gastroenterology  7. Hospital discharge follow-up Doing well     Patient have been counseled extensively about nutrition and exercise  Return in about 1 month (around 04/14/2020) for 1 month with Lurena Joiner for DM; about 2.5 months with Zelda for chronic conditions.  The patient was given clear instructions to go to ER or return to medical center if symptoms don't improve, worsen or new problems develop. The patient verbalized understanding. The patient was told to call to get lab results if they haven't heard anything in the next week.     Freeman Caldron, PA-C [facility] [place], [place]   03/17/2020, 9:47 AM

## 2020-03-17 ENCOUNTER — Other Ambulatory Visit: Payer: Self-pay

## 2020-03-17 ENCOUNTER — Other Ambulatory Visit: Payer: Self-pay | Admitting: Physician Assistant

## 2020-03-17 ENCOUNTER — Ambulatory Visit: Payer: PRIVATE HEALTH INSURANCE | Attending: Physician Assistant | Admitting: Physician Assistant

## 2020-03-17 ENCOUNTER — Encounter: Payer: Self-pay | Admitting: Physician Assistant

## 2020-03-17 VITALS — BP 104/73 | HR 88 | Wt 260.8 lb

## 2020-03-17 DIAGNOSIS — E785 Hyperlipidemia, unspecified: Secondary | ICD-10-CM | POA: Diagnosis not present

## 2020-03-17 DIAGNOSIS — I10 Essential (primary) hypertension: Secondary | ICD-10-CM | POA: Diagnosis not present

## 2020-03-17 DIAGNOSIS — R7989 Other specified abnormal findings of blood chemistry: Secondary | ICD-10-CM | POA: Diagnosis not present

## 2020-03-17 DIAGNOSIS — E1165 Type 2 diabetes mellitus with hyperglycemia: Secondary | ICD-10-CM | POA: Diagnosis not present

## 2020-03-17 DIAGNOSIS — K55069 Acute infarction of intestine, part and extent unspecified: Secondary | ICD-10-CM

## 2020-03-17 DIAGNOSIS — Z09 Encounter for follow-up examination after completed treatment for conditions other than malignant neoplasm: Secondary | ICD-10-CM

## 2020-03-17 DIAGNOSIS — Z1211 Encounter for screening for malignant neoplasm of colon: Secondary | ICD-10-CM

## 2020-03-17 LAB — GLUCOSE, POCT (MANUAL RESULT ENTRY): POC Glucose: 167 mg/dl — AB (ref 70–99)

## 2020-03-17 MED ORDER — LANTUS SOLOSTAR 100 UNIT/ML ~~LOC~~ SOPN
18.0000 [IU] | PEN_INJECTOR | Freq: Every day | SUBCUTANEOUS | 99 refills | Status: DC
  Filled 2020-05-30 – 2020-06-06 (×2): qty 6, 33d supply, fill #0
  Filled 2020-07-06: qty 6, 33d supply, fill #1
  Filled 2020-08-08: qty 9, 50d supply, fill #2
  Filled 2020-09-13: qty 6, 33d supply, fill #3
  Filled 2020-10-16: qty 6, 33d supply, fill #4

## 2020-03-17 MED ORDER — GLIMEPIRIDE 2 MG PO TABS: 2.0000 mg | ORAL_TABLET | Freq: Every day | ORAL | 1 refills | Status: DC

## 2020-03-17 MED ORDER — LANTUS SOLOSTAR 100 UNIT/ML ~~LOC~~ SOPN: 18.0000 [IU] | PEN_INJECTOR | Freq: Every day | SUBCUTANEOUS | 99 refills | Status: DC

## 2020-03-17 MED ORDER — PEN NEEDLES 30G X 8 MM MISC: 1.0000 | Freq: Every day | 5 refills | Status: DC

## 2020-03-17 MED ORDER — LOSARTAN POTASSIUM 50 MG PO TABS: 50.0000 mg | ORAL_TABLET | Freq: Every day | ORAL | 3 refills | Status: DC

## 2020-03-17 MED ORDER — RIVAROXABAN 20 MG PO TABS: 20.0000 mg | ORAL_TABLET | Freq: Every day | ORAL | 4 refills | Status: DC

## 2020-03-17 MED ORDER — METFORMIN HCL ER 500 MG PO TB24
500.0000 mg | ORAL_TABLET | Freq: Three times a day (TID) | ORAL | 1 refills | Status: DC
Start: 2020-03-17 — End: 2020-05-03

## 2020-03-17 MED FILL — metFORMIN HCL ER 500 MG TB2: 500 | 30 days supply | Qty: 90 | Fill #0

## 2020-03-17 MED FILL — ALLOPURINOL 100 MG TABLET: 100 | 30 days supply | Qty: 30 | Fill #2

## 2020-03-17 MED FILL — TRUEPLUS PEN NDL 31GX5/16: 31G X 8 MM | 90 days supply | Qty: 100 | Fill #0

## 2020-03-17 MED FILL — LOSARTAN POTASSIUM 50 MG TA: 50 | 30 days supply | Qty: 30 | Fill #0

## 2020-03-17 MED FILL — $LANTUS SOLOSTAR 100 UNITS/: 100 | 83 days supply | Qty: 15 | Fill #0

## 2020-03-17 MED FILL — GLIMEPIRIDE 2 MG TABS: 2 | 30 days supply | Qty: 30 | Fill #0

## 2020-03-17 NOTE — Patient Instructions (Signed)
Check blood sugars fasting and at bedtime and record readings and bring to next appt.

## 2020-03-17 NOTE — Progress Notes (Signed)
Hospital f/u.

## 2020-03-18 LAB — CBC WITH DIFFERENTIAL/PLATELET
Basophils Absolute: 0.1 10*3/uL (ref 0.0–0.2)
Basos: 1 %
EOS (ABSOLUTE): 0.2 10*3/uL (ref 0.0–0.4)
Eos: 2 %
Hematocrit: 40.8 % (ref 37.5–51.0)
Hemoglobin: 13.7 g/dL (ref 13.0–17.7)
Immature Grans (Abs): 0.1 10*3/uL (ref 0.0–0.1)
Immature Granulocytes: 1 %
Lymphocytes Absolute: 3.6 10*3/uL — ABNORMAL HIGH (ref 0.7–3.1)
Lymphs: 25 %
MCH: 31.4 pg (ref 26.6–33.0)
MCHC: 33.6 g/dL (ref 31.5–35.7)
MCV: 94 fL (ref 79–97)
Monocytes Absolute: 0.7 10*3/uL (ref 0.1–0.9)
Monocytes: 5 %
Neutrophils Absolute: 9.6 10*3/uL — ABNORMAL HIGH (ref 1.4–7.0)
Neutrophils: 66 %
Platelets: 599 10*3/uL — ABNORMAL HIGH (ref 150–450)
RBC: 4.36 x10E6/uL (ref 4.14–5.80)
RDW: 13.7 % (ref 11.6–15.4)
WBC: 14.3 10*3/uL — ABNORMAL HIGH (ref 3.4–10.8)

## 2020-03-18 LAB — COMPREHENSIVE METABOLIC PANEL
ALT: 52 IU/L — ABNORMAL HIGH (ref 0–44)
AST: 30 IU/L (ref 0–40)
Albumin/Globulin Ratio: 1.1 — ABNORMAL LOW (ref 1.2–2.2)
Albumin: 4.1 g/dL (ref 3.8–4.9)
Alkaline Phosphatase: 152 IU/L — ABNORMAL HIGH (ref 44–121)
BUN/Creatinine Ratio: 13 (ref 9–20)
BUN: 12 mg/dL (ref 6–24)
Bilirubin Total: 0.3 mg/dL (ref 0.0–1.2)
CO2: 22 mmol/L (ref 20–29)
Calcium: 10.5 mg/dL — ABNORMAL HIGH (ref 8.7–10.2)
Chloride: 98 mmol/L (ref 96–106)
Creatinine, Ser: 0.91 mg/dL (ref 0.76–1.27)
GFR calc Af Amer: 110 mL/min/{1.73_m2} (ref >59)
GFR calc non Af Amer: 95 mL/min/{1.73_m2} (ref >59)
Globulin, Total: 3.8 g/dL (ref 1.5–4.5)
Glucose: 159 mg/dL — ABNORMAL HIGH (ref 65–99)
Potassium: 4.8 mmol/L (ref 3.5–5.2)
Sodium: 139 mmol/L (ref 134–144)
Total Protein: 7.9 g/dL (ref 6.0–8.5)

## 2020-03-22 NOTE — Progress Notes (Signed)
    S:    PCP: [name] PMH: HTN, T2DM, CAD, HLD  Patient arrives in good spirits.  Presents for diabetes evaluation, education, and management. Patient was referred and last seen by Levada Dy on 03/17/20 for follow-up visit after hospitalization for mesenteric artery occlusion. Pt reported BG 160-200s and Lantus was increased from 16 to 18 units daily.    Today, patient reports medication adherence with Lantus 18 units daily, glimepiride 2 mg daily, and metformin 500 mg XR TID. Denies diarrhea, nausea, and vomiting. Reports home FBG 110s and PBG 150. Denies BG <70 and >200.   Family/Social History: -Fhx: DM in mother and father -Tobacco: former smoker  Human resources officer affordability: dnbi  Medication adherence reported.   Current diabetes medications include: Lantus 18 units daily (AM), glimepiride 2 mg daily, metformin 500 mg XR TID Current hypertension medications include: losartan 50 mg daily Current hyperlipidemia medications include: atorvastatin 80 mg daily  Patient denies hypoglycemic events.  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  Patient denies neuropathy (nerve pain). Patient denies visual changes. Patient reports self foot exams.     O:  POCT: 145 (fasting)  Home fasting blood sugars: 110s 2 hour post-meal/random blood sugars: 150  Lab Results  Component Value Date   HGBA1C 11.0 (H) 03/12/2020   There were no vitals filed for this visit.  Lipid Panel     Component Value Date/Time   CHOL 145 02/03/2020 1616   TRIG 300 (H) 02/03/2020 1616   HDL 33 (L) 02/03/2020 1616   CHOLHDL 4.4 02/03/2020 1616   CHOLHDL 4.4 08/03/2019 2005   VLDL UNABLE TO CALCULATE IF TRIGLYCERIDE OVER 400 mg/dL  08/03/2019 2005   [place] 64 02/03/2020 1616   LDLDIRECT 57.0 08/03/2019 2005    Clinical Atherosclerotic Cardiovascular Disease (ASCVD): No  The 10-year ASCVD risk score Mikey Bussing DC Jr., et al., 2013) is: 23.3%   Values used to calculate the score:     Age: 55 years     Sex: Male     Is Non-Hispanic African American: Yes     Diabetic: Yes     Tobacco smoker: Yes     Systolic Blood Pressure: 789 mmHg     Is BP treated: Yes     HDL Cholesterol: 33 mg/dL     Total Cholesterol: 145 mg/dL    A/P: Diabetes longstanding currently uncontrolled, however sugars continue to improve with recent increase of Lantus from 16 units to 18 units daily. Patient is able to verbalize appropriate hypoglycemia management plan. Medication adherence appears optimal.  -Continued Lantus 18 units daily -Continued glimepiride 2 mg daily -Continued metformin 500 mg XR TID -Extensively discussed pathophysiology of diabetes, recommended lifestyle interventions, dietary effects on blood sugar control -Counseled on s/sx of and management of hypoglycemia -Next A1C anticipated May 2022.   ASCVD risk - primary prevention in patient with diabetes. Last LDL is controlled. ASCVD risk score is >20% - moderate or high intensity statin indicated. -Continued atorvastatin 80 mg daily  Written patient instructions provided.  Total time in face to face counseling 20 minutes.   Follow up PCP Clinic Visit in ~5 weeks.    Lorel Monaco, PharmD, [place] PGY2 Ambulatory Care Resident [place]

## 2020-03-23 ENCOUNTER — Ambulatory Visit: Payer: PRIVATE HEALTH INSURANCE | Attending: Nurse Practitioner | Admitting: Pharmacist

## 2020-03-23 ENCOUNTER — Other Ambulatory Visit: Payer: Self-pay

## 2020-03-23 ENCOUNTER — Other Ambulatory Visit: Payer: Self-pay | Admitting: Physician Assistant

## 2020-03-23 DIAGNOSIS — E1165 Type 2 diabetes mellitus with hyperglycemia: Secondary | ICD-10-CM | POA: Diagnosis not present

## 2020-03-23 DIAGNOSIS — D75839 Thrombocytosis, unspecified: Secondary | ICD-10-CM

## 2020-03-23 LAB — GLUCOSE, POCT (MANUAL RESULT ENTRY): POC Glucose: 145 mg/dl — AB (ref 70–99)

## 2020-03-24 ENCOUNTER — Telehealth (HOSPITAL_COMMUNITY): Payer: Self-pay | Admitting: Pharmacist

## 2020-03-24 NOTE — Telephone Encounter (Signed)
Pharmacy Transitions of Care Follow-up Telephone Call  Date of discharge: 03/15/20 Discharge Diagnosis: clot in abdominal vessel  How have you been since you were released from the hospital? well  Medication changes made at discharge: New Meds: Xarelto Augmentin   Stop: ASA and IBU  Medication changes obtained and verified? Yes    Medication Accessibility:  Home Pharmacy: [facility]  Was the patient provided with refills on discharged medications? Yes   Have all prescriptions been transferred from [facility] to home pharmacy? Yes  Is the patient able to afford medications? Yes . Notable copays: n/a . Eligible patient assistance: [facility] pharmacy is assisting in medication assistance for Xarelto. Pt has completed necessary financial assistance forms and has returned them to pharmacy.    Medication Review:  RIVAROXABAN (XARELTO)  Rivaroxaban 15 mg BID initiated on 03/15/20. Will switch to 20 mg daily after 21 days (approx 04/05/20 according to starter pack).   - Discussed importance of taking medication with food and around the same time everyday - Pt reports compliance and has not missed doses - Reviewed potential DDIs with patient  - Advised patient of medications to avoid (NSAIDs, ASA) - pt has received mixed information regarding ASA. Discharge AVS says to stop ASA due to xarelto.  I have asked pt to follow-up with MD about whether or not to resume.  - Educated that Tylenol (acetaminophen) will be the preferred analgesic to prevent risk of bleeding  - Emphasized importance of monitoring for signs and symptoms of bleeding (abnormal bruising, prolonged bleeding, nose bleeds, bleeding from gums, discolored urine, black tarry stools)  - Advised patient to alert all providers of anticoagulation therapy prior to starting a new medication or having a procedure   Follow-up Appointments:  PCP Hospital f/u appt confirmed? Pt has already had 2 follow-ups with [facility] on 2/10 and 2/16   [facility] f/u appt confirmed? none   If their condition worsens, is the pt aware to call PCP or go to the Emergency Dept.? yes  Final Patient Assessment: Pt is doing well and demonstrated a good understanding of medical condition and new medications.  It was a pleasure speaking with this patient.

## 2020-04-07 ENCOUNTER — Ambulatory Visit: Payer: PRIVATE HEALTH INSURANCE | Attending: Nurse Practitioner

## 2020-04-07 ENCOUNTER — Other Ambulatory Visit: Payer: Self-pay

## 2020-04-07 DIAGNOSIS — D75839 Thrombocytosis, unspecified: Secondary | ICD-10-CM

## 2020-04-07 MED FILL — ATORVASTATIN CALCIUM 40 MG: 40 | 30 days supply | Qty: 30 | Fill #0

## 2020-04-08 LAB — CBC WITH DIFFERENTIAL/PLATELET
Basophils Absolute: 0.1 10*3/uL (ref 0.0–0.2)
Basos: 1 %
EOS (ABSOLUTE): 0.3 10*3/uL (ref 0.0–0.4)
Eos: 3 %
Hematocrit: 40.9 % (ref 37.5–51.0)
Hemoglobin: 14 g/dL (ref 13.0–17.7)
Immature Grans (Abs): 0 10*3/uL (ref 0.0–0.1)
Immature Granulocytes: 0 %
Lymphocytes Absolute: 3.9 10*3/uL — ABNORMAL HIGH (ref 0.7–3.1)
Lymphs: 39 %
MCH: 32.1 pg (ref 26.6–33.0)
MCHC: 34.2 g/dL (ref 31.5–35.7)
MCV: 94 fL (ref 79–97)
Monocytes Absolute: 0.7 10*3/uL (ref 0.1–0.9)
Monocytes: 7 %
Neutrophils Absolute: 5.1 10*3/uL (ref 1.4–7.0)
Neutrophils: 50 %
Platelets: 189 10*3/uL (ref 150–450)
RBC: 4.36 x10E6/uL (ref 4.14–5.80)
RDW: 14.5 % (ref 11.6–15.4)
WBC: 10 10*3/uL (ref 3.4–10.8)

## 2020-04-12 MED FILL — XARELTO 20 MG TABLET: 20 | 30 days supply | Qty: 30 | Fill #0

## 2020-04-13 ENCOUNTER — Other Ambulatory Visit: Payer: Self-pay

## 2020-04-13 ENCOUNTER — Encounter: Payer: Self-pay | Admitting: *Deleted

## 2020-04-13 DIAGNOSIS — K55069 Acute infarction of intestine, part and extent unspecified: Secondary | ICD-10-CM

## 2020-04-14 ENCOUNTER — Ambulatory Visit: Payer: PRIVATE HEALTH INSURANCE

## 2020-04-14 ENCOUNTER — Other Ambulatory Visit: Payer: Self-pay

## 2020-04-14 ENCOUNTER — Other Ambulatory Visit: Payer: Self-pay | Admitting: Internal Medicine

## 2020-04-14 ENCOUNTER — Telehealth: Payer: Self-pay | Admitting: *Deleted

## 2020-04-14 ENCOUNTER — Ambulatory Visit (INDEPENDENT_AMBULATORY_CARE_PROVIDER_SITE_OTHER): Payer: PRIVATE HEALTH INSURANCE | Admitting: Internal Medicine

## 2020-04-14 ENCOUNTER — Telehealth: Payer: Self-pay

## 2020-04-14 ENCOUNTER — Encounter: Payer: Self-pay | Admitting: Internal Medicine

## 2020-04-14 VITALS — BP 122/80 | HR 82 | Ht 74.0 in | Wt 270.0 lb

## 2020-04-14 DIAGNOSIS — E785 Hyperlipidemia, unspecified: Secondary | ICD-10-CM

## 2020-04-14 DIAGNOSIS — E1159 Type 2 diabetes mellitus with other circulatory complications: Secondary | ICD-10-CM | POA: Diagnosis not present

## 2020-04-14 DIAGNOSIS — K55069 Acute infarction of intestine, part and extent unspecified: Secondary | ICD-10-CM

## 2020-04-14 DIAGNOSIS — I700 Atherosclerosis of aorta: Secondary | ICD-10-CM | POA: Diagnosis not present

## 2020-04-14 DIAGNOSIS — I4891 Unspecified atrial fibrillation: Secondary | ICD-10-CM

## 2020-04-14 DIAGNOSIS — I152 Hypertension secondary to endocrine disorders: Secondary | ICD-10-CM

## 2020-04-14 MED ORDER — WARFARIN SODIUM 5 MG PO TABS: ORAL_TABLET | ORAL | 0 refills | Status: DC

## 2020-04-14 MED FILL — WARFARIN SODIUM 5 MG TABLET: 5 | 3 days supply | Qty: 14 | Fill #0

## 2020-04-14 NOTE — Telephone Encounter (Signed)
Noted pt is on the Anticoagulation Clinic schedule this afternoon. After assessing the chart noted that the pt is not on Warfarin and still taking Xarelto. Xarelto is not monitored via an INR level but Warfarin is; therefore reached out to the ordering nurse for the Coumadin level check at [facility]. Spoke with with Cristie Hem RN and she was able to update me on the situation. She states the pt originally thought he had 2 days of Xarelto left and their office knew he had to get placed on another anticoagulant since the Xarelto was costly per pt. Then she found out later that the pt had 30 days of Xarelto left. The pt has not started Warfarin but will start after his 30 day supply of Xarelto he has left if he cannot get Xarelto lower. Advised to Cristie Hem that when the pt has 3 days left of Xarelto he will need to overlap with Warfarin 5mg  and Xarelto then take Warfarin 5mg  until appt within 6-7 days. Advised normal dose to start [place] is 5mg  daily. Alex was helpful and would communicate to the pt that he still has to attend the appt with Dr. Sheldon Silvan at 140pm today. Alex also sent in the Warfarin 5mg  tablets (only a 2 week supply) for when the pt will start and gave him instructions. Once the pt comes in to see our Provider today will go down and ensure pt is aware of Clinic expectations and starting Warfarin.

## 2020-04-14 NOTE — Progress Notes (Signed)
Cardiology Office Note:    Date:  04/14/2020   ID:  Riley Wiggins, DOB 08/09/1965, MRN [id number]  PCP:  Gildardo Pounds, NP   [place]  Cardiologist:  Werner Lean, MD  Advanced Practice Provider:  No care team member to display Electrophysiologist:  None       CC: Transition to coumadin Consulted for the evaluation of coumadin transition at the behest of Raul Del, Zelda W, NP  History of Present Illness:    Riley Wiggins is a 55 y.o. 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  Patient notes that he is feeling OK.   Patient noted that he has acute abdominal pain.  Found to have an acute SMB thrombus.  Seen by [facility]ascular surgery planned for 6 months AC.  Discharged with resolution of pain.  Has had no chest pain, chest pressure, chest tightness, chest stinging.   Patient exertion notable for walking and taking the steps post the hospital with  and feels no symptoms.  No shortness of breath, DOE .  No PND or orthopnea.  No bendopnea. wNo syncope or near syncope . Notes  no palpitations or funny heart beats.     Ambulatory BP not done.   Past Medical History:  Diagnosis Date  . Diabetes mellitus without complication ([place])   . Dyslipidemia   . Hyperlipidemia   . Hypertension   . 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     Past Surgical History:  Procedure Laterality Date  . HERNIA REPAIR     " when I was young "  . NO PAST SURGERIES      Current Medications: Current Meds  Medication Sig  . acetaminophen (TYLENOL) 500 MG tablet Take 1,000 mg by mouth every 6 (six) hours as needed for mild pain.  Marland Kitchen allopurinol (ZYLOPRIM) 100 MG tablet Take 1 tablet (100 mg total) by mouth daily.  . Ascorbic Acid (VITAMIN C) 1000 MG tablet Take 1,000 mg by mouth daily.  Marland Kitchen  atorvastatin (LIPITOR) 40 MG tablet Take 1 tablet (40 mg total) by mouth daily.  . Blood Glucose Monitoring Suppl (TRUE METRIX METER) w/Device KIT 1 each by Does not apply route in the morning and at bedtime.  . Garlic 2536 MG CAPS Take 1,200 mg by mouth daily.  Marland Kitchen glimepiride (AMARYL) 2 MG tablet Take 1 tablet (2 mg total) by mouth daily with breakfast.  . glucose blood (TRUE METRIX BLOOD GLUCOSE TEST) test strip Use as instructed. Check blood glucose level by fingerstick twice per day. E11.65  . insulin glargine (LANTUS SOLOSTAR) 100 UNIT/ML Solostar Pen Inject 18 Units into the skin daily.  . Insulin Pen Needle (PEN NEEDLES) 30G X 8 MM MISC 1 each by Does not apply route daily.  Marland Kitchen losartan (COZAAR) 50 MG tablet Take 1 tablet (50 mg total) by mouth daily.  . metFORMIN (GLUCOPHAGE-XR) 500 MG 24 hr tablet Take 1 tablet (500 mg total) by mouth 3 (three) times daily.  . Multiple Vitamins-Minerals (MULTI FOR HIM 50+) TABS Take 1 tablet by mouth daily.  . rivaroxaban (XARELTO) 20 MG TABS tablet Take 1 tablet (20 mg total) by mouth daily with supper.  . TRUEplus Lancets 28G MISC 1 each by Does not apply route in the morning and at bedtime.  Marland Kitchen warfarin (COUMADIN) 5 MG tablet  Take one tablet by mouth daily with Xarelto for the last three days of your Xarelto prescription. After those 3 days are over, take one tablet by mouth daily. 6-7 days after initiation of Warfarin, you will need appointment with heartcare coumadin clinic.  . [DISCONTINUED] RIVAROXABAN (XARELTO) VTE STARTER PACK (15 & 20 MG) Follow package directions: Take one $Remove'15mg'ONIHbnT$  tablet by mouth twice a day. On day 22, switch to one $Remo'20mg'ITdsY$  tablet once a day. Take with food.     Allergies:   Patient has no known allergies.   Social History   Socioeconomic History  . Marital status: Single    Spouse name: Not on file  . Number of children: Not on file  . Years of education: Not on file  . Highest education level: Not on file  Occupational History   . Not on file  Tobacco Use  . Smoking status: Former Smoker    Types: Cigarettes  . Smokeless tobacco: Never Used  . Tobacco comment: Quit smoking 5 years ago  Vaping Use  . Vaping Use: Never used  Substance and Sexual Activity  . Alcohol use: Not Currently    Comment: rare  . Drug use: No  . Sexual activity: Yes  Other Topics Concern  . Not on file  Social History Narrative   Works at [employer]   Lives in [place] with girlfriend    Social Determinants of Radio broadcast assistant Strain: Not on file  Food Insecurity: Not on file  Transportation Needs: Not on file  Physical Activity: Not on file  Stress: Not on file  Social Connections: Not on file     Family History: The patient's family history includes Diabetes in his father and mother. There is no history of Pancreatitis.  ROS:   Please see the history of present illness.     All other systems reviewed and are negative.  EKGs/Labs/Other Studies Reviewed:    The following studies were reviewed today:  EKG:  04/14/20:  SR 82 Baseline Artifact Noted  Cardiac CT: Date:04/19/2007 Results: Findings:  Ascending aorta above sinotubular junction is mildly dilated at 3.5 cm in  diameter.The descending aorta at the same level is 2.2 cm in diameter.   The left atrium has normal morphology. The aortic valve is tricuspid.   Normal right dominant coronary anatomy.  No coronary calcification.   Left Main: No stenosis.  Left Anterior Descending: No stenosis.  Left Circumflex: No stenosis.  Right Coronary Artery: No stenosis.   Impression:  1) No significant coronary artery disease.  2) No coronary calcification.  3) See additional report for lung fields.      Recent Labs: 10/28/2019: TSH 0.612 03/17/2020: ALT 52; BUN 12; Creatinine, Ser 0.91; Potassium 4.8; Sodium 139 04/07/2020: Hemoglobin 14.0; Platelets 189  Recent Lipid Panel    Component Value Date/Time   CHOL 145 02/03/2020 1616   TRIG  300 (H) 02/03/2020 1616   HDL 33 (L) 02/03/2020 1616   CHOLHDL 4.4 02/03/2020 1616   CHOLHDL 4.4 08/03/2019 2005   VLDL UNABLE TO CALCULATE IF TRIGLYCERIDE OVER 400 mg/dL 08/03/2019 2005   LDLCALC 64 02/03/2020 1616   LDLDIRECT 57.0 08/03/2019 2005     Risk Assessment/Calculations:     The 10-year ASCVD risk score Mikey Bussing DC Brooke Bonito., et al., 2013) is: 30.3%   Values used to calculate the score:     Age: 59 years     Sex: Male     Is Non-Hispanic African American: Yes  Diabetic: Yes     Tobacco smoker: Yes     Systolic Blood Pressure: 951 mmHg     Is BP treated: Yes     HDL Cholesterol: 33 mg/dL     Total Cholesterol: 145 mg/dL   Physical Exam:    VS:  BP 122/80   Pulse 82   Ht $R'6\' 2"'le$  (1.88 m)   Wt 270 lb (122.5 kg)   SpO2 96%   BMI 34.67 kg/m     Wt Readings from Last 3 Encounters:  04/14/20 270 lb (122.5 kg)  03/17/20 260 lb 12.8 oz (118.3 kg)  03/12/20 270 lb (122.5 kg)     GEN:  Well nourished, well developed in no acute distress HEENT: Normal NECK: No JVD; No carotid bruits LYMPHATICS: No lymphadenopathy CARDIAC: RRR, no murmurs, rubs, gallops RESPIRATORY:  Clear to auscultation without rales, wheezing or rhonchi  ABDOMEN: Soft, non-tender, non-distended MUSCULOSKELETAL:  No edema; No deformity  SKIN: Warm and dry NEUROLOGIC:  Alert and oriented x 3 PSYCHIATRIC:  Normal affect   ASSESSMENT:    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 Dyslipidemia   3. Aortic atherosclerosis ([place])   4. Hypertension associated with diabetes ([place])    PLAN:    In order of problems listed above:  Superior Mesenteric Thrombus - will get echo and 14 day non-live heart monitor for etiology of thrombus - discussed coumadin clinic transition   Hypertension with Diabetes - ambulatory blood pressure not done, will start ambulatory BP monitoring; gave education on how to perform ambulatory blood pressure monitoring including the frequency and technique; goal  ambulatory blood pressure < 135/85 on average - continue home medications - discussed diet (DASH/low sodium), and exercise/weight loss interventions   Aortic Atherosclerosis Hyperlipidemia (Trigylcerides) -LDL goal less than 70 - triglycerides were significantly elevated; will recheck during next coumadin check -continue current statin  7 months follow up unless new symptoms or abnormal test results warranting change in plan  Would be reasonable for  APP Follow up   Medication Adjustments/Labs and Tests Ordered: Current medicines are reviewed at length with the patient today.  Concerns regarding medicines are outlined above.  Orders Placed This Encounter  Procedures  . Lipid panel  . LONG TERM MONITOR (3-14 DAYS)  . EKG 12-Lead  . ECHOCARDIOGRAM COMPLETE   No orders of the defined types were placed in this encounter.   Patient Instructions  Medication Instructions:  Your physician recommends that you continue on your current medications as directed. Please refer to the Current Medication list given to you today.  *If you need a refill on your cardiac medications before your next appointment, please call your pharmacy*   Lab Work: AT NEXT COUMADIN APPOINTMENT: Fasting lipid panel If you have labs (blood work) drawn today and your tests are completely normal, you will receive your results only by: Marland Kitchen MyChart Message (if you have MyChart) OR . A paper copy in the mail If you have any lab test that is abnormal or we need to change your treatment, we will call you to review the results.   Testing/Procedures: Your physician has requested that you have an echocardiogram. Echocardiography is a painless test that uses sound waves to create images of your heart. It provides your doctor with information about the size and shape of your heart and how well your heart's chambers and valves are working. This procedure takes approximately one hour. There are no restrictions for this  procedure.     Follow-Up:  At [facility], you and your health needs are our priority.  As part of our continuing mission to provide you with exceptional heart care, we have created designated Provider Care Teams.  These Care Teams include your primary Cardiologist (physician) and Advanced Practice Providers (APPs -  Physician Assistants and Nurse Practitioners) who all work together to provide you with the care you need, when you need it.  We recommend signing up for the patient portal called "MyChart".  Sign up information is provided on this After Visit Summary.  MyChart is used to connect with patients for Virtual Visits (Telemedicine).  Patients are able to view lab/test results, encounter notes, upcoming appointments, etc.  Non-urgent messages can be sent to your provider as well.   To learn more about what you can do with MyChart, go to [email or web address].    Your next appointment:   7 month(s)  The format for your next appointment:   In Person  Provider:   You may see Werner Lean, MD or one of the following Advanced Practice Providers on your designated Care Team:    Melina Copa, PA-C  Ermalinda Barrios, PA-C           Signed, Werner Lean, MD  04/14/2020 2:41 PM    [place]

## 2020-04-14 NOTE — Telephone Encounter (Signed)
Spoke to [employer] from [facility] coumadin clinic. After patient set up for appt at clinic today, found out patient has 30 days of Xarelto. He needs to keep his appt today at [facility] with the cardiologist, Dr. Gasper Sells but will not have his level checked. He will require 2 weeks of coumadin 5 mg called in that he will over lap with his last 3 days of Xarelto. [facility] can write this. Within 6-7 days of the initiation of warfarin, he will need appt at the clinic to take over and manage his warfarin rx. Left message with patient VM to call [facility] back so we can update him.

## 2020-04-14 NOTE — Patient Instructions (Signed)
Medication Instructions:  Your physician recommends that you continue on your current medications as directed. Please refer to the Current Medication list given to you today.  *If you need a refill on your cardiac medications before your next appointment, please call your pharmacy*   Lab Work: AT NEXT COUMADIN APPOINTMENT: Fasting lipid panel If you have labs (blood work) drawn today and your tests are completely normal, you will receive your results only by: Marland Kitchen MyChart Message (if you have MyChart) OR . A paper copy in the mail If you have any lab test that is abnormal or we need to change your treatment, we will call you to review the results.   Testing/Procedures: Your physician has requested that you have an echocardiogram. Echocardiography is a painless test that uses sound waves to create images of your heart. It provides your doctor with information about the size and shape of your heart and how well your heart's chambers and valves are working. This procedure takes approximately one hour. There are no restrictions for this procedure.     Follow-Up: At [facility], you and your health needs are our priority.  As part of our continuing mission to provide you with exceptional heart care, we have created designated Provider Care Teams.  These Care Teams include your primary Cardiologist (physician) and Advanced Practice Providers (APPs -  Physician Assistants and Nurse Practitioners) who all work together to provide you with the care you need, when you need it.  We recommend signing up for the patient portal called "MyChart".  Sign up information is provided on this After Visit Summary.  MyChart is used to connect with patients for Virtual Visits (Telemedicine).  Patients are able to view lab/test results, encounter notes, upcoming appointments, etc.  Non-urgent messages can be sent to your provider as well.   To learn more about what you can do with MyChart, go to  [email or web address].    Your next appointment:   7 month(s)  The format for your next appointment:   In Person  Provider:   You may see Werner Lean, MD or one of the following Advanced Practice Providers on your designated Care Team:    Melina Copa, PA-C  Ermalinda Barrios, PA-C

## 2020-04-14 NOTE — Telephone Encounter (Signed)
Pt was in the office to see Dr. Gasper Sells and went down to speak with pt since he will be switching to Warfarin after taking his 30 day supply Xarelto. Pt states that Xarelto would cost over $200 out of pocket so Warfarin would be affordable. Pt is aware that he will have to complete weekly checks for the first four weeks Also, pt is aware adherence to appointments is very important. Pt advised to call with any questions at [phone]. Gave directions to take Xarelto 20mg  daily until he has 3 days left and then he will overlap both Warfarin and Xarelto for the last 3 days on 05/12/20, 05/13/20, 05/14/20.  Pt is aware that Vascular has only sent in 2 week supply and to pick up before 05/12/20.  Start taking Warfarin 5mg  on 05/12/20, 05/13/20, 05/14/20 and take your normal dose of Xarelto 20mg  on 05/12/20, 05/13/20, 05/14/20. Both will be taken together for 3 days only then take Warfarin 5mg  daily and NO MORE XARELTO.  Anticoagulation Appt will be on 05/19/20 at 1030am  Anticoagulation Clinic [phone]

## 2020-04-18 ENCOUNTER — Other Ambulatory Visit: Payer: PRIVATE HEALTH INSURANCE

## 2020-04-19 ENCOUNTER — Telehealth: Payer: Self-pay | Admitting: Internal Medicine

## 2020-04-19 NOTE — Telephone Encounter (Signed)
Called patient to clarify reason for wearing heart monitor.  I explained to patient that Dr. Gasper Sells wanted him to wear the monitor for 14 days.  The reasoning is to see if the thrombus he had was due to Atrial Fibrillation. I explained to him what AF is and how it could be cause of thrombus.  Patient expresses that he had a test coming up on the 17th, I told him to wait until after testing to wear the monitor.  He agreed no further questions or concerns.

## 2020-04-19 NOTE — Telephone Encounter (Signed)
Patients wife call to get clarification on why the patients needs to wear a heart monitor and for how long.

## 2020-04-21 ENCOUNTER — Ambulatory Visit: Payer: PRIVATE HEALTH INSURANCE | Admitting: Gastroenterology

## 2020-05-03 ENCOUNTER — Other Ambulatory Visit: Payer: Self-pay | Admitting: Nurse Practitioner

## 2020-05-03 ENCOUNTER — Other Ambulatory Visit: Payer: Self-pay

## 2020-05-03 ENCOUNTER — Ambulatory Visit: Payer: PRIVATE HEALTH INSURANCE | Attending: Nurse Practitioner | Admitting: Nurse Practitioner

## 2020-05-03 DIAGNOSIS — E1165 Type 2 diabetes mellitus with hyperglycemia: Secondary | ICD-10-CM | POA: Diagnosis not present

## 2020-05-03 DIAGNOSIS — I10 Essential (primary) hypertension: Secondary | ICD-10-CM | POA: Diagnosis not present

## 2020-05-03 DIAGNOSIS — K55069 Acute infarction of intestine, part and extent unspecified: Secondary | ICD-10-CM

## 2020-05-03 DIAGNOSIS — E785 Hyperlipidemia, unspecified: Secondary | ICD-10-CM

## 2020-05-03 MED ORDER — TRUEPLUS LANCETS 28G MISC: 1 refills | Status: DC

## 2020-05-03 MED ORDER — LOSARTAN POTASSIUM 50 MG PO TABS: 50.0000 mg | ORAL_TABLET | Freq: Every day | ORAL | 3 refills | Status: DC

## 2020-05-03 MED ORDER — METFORMIN HCL ER 500 MG PO TB24: ORAL_TABLET | ORAL | 1 refills | Status: DC

## 2020-05-03 MED ORDER — GLIMEPIRIDE 2 MG PO TABS
2.0000 mg | ORAL_TABLET | Freq: Every day | ORAL | 1 refills | Status: DC
Start: 2020-05-03 — End: 2020-05-03

## 2020-05-03 MED ORDER — PEN NEEDLES 30G X 8 MM MISC
5 refills | Status: DC
Start: 2020-05-03 — End: 2020-10-24

## 2020-05-03 MED ORDER — METFORMIN HCL ER 500 MG PO TB24
ORAL_TABLET | ORAL | 1 refills | Status: DC
Start: 2020-05-03 — End: 2020-05-03

## 2020-05-03 NOTE — Progress Notes (Signed)
Virtual Visit via Telephone Note Due to national recommendations of social distancing due to [place] 19, telehealth visit is felt to be most appropriate for this patient at this time.  I discussed the limitations, risks, security and privacy concerns of performing an evaluation and management service by telephone and the availability of in person appointments. I also discussed with the patient that there may be a patient responsible charge related to this service. The patient expressed understanding and agreed to proceed.    I connected with Riley Wiggins on 05/03/20  at   4:10 PM EDT  EDT by telephone and verified that I am speaking with the correct person using two identifiers.   Consent I discussed the limitations, risks, security and privacy concerns of performing an evaluation and management service by telephone and the availability of in person appointments. I also discussed with the patient that there may be a patient responsible charge related to this service. The patient expressed understanding and agreed to proceed.   Location of Patient: Private Residence   Location of Provider: [place] [facility] [employer] Office    Persons participating in Telemedicine visit: [name] FNP-BC [place]    History of Present Illness: Telemedicine visit for: Follow up He has a past medical history of DM2,  Dyslipidemia, Hyperlipidemia, Hypertension, and Thrombosis (03/15/2020).   He would like to take xarelto instead of coumadin. He needs to apply for the patient assistance program here and his xarelto should be free of charge. He states he couldn't afford it and that is why he was switched to coumadin. 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    DM 2 Poorly controlled. States Blood glucose levels have been lower since he has been taking lantus 18 units daily. Average readings: 110-170. He is also taking metformin 500 mg in the am. Will  increase to 500 mg in the am and 1000 mg in the pm. He will continue on glimepiride 2 mg daily. LDL at goal  Lab Results  Component Value Date   HGBA1C 11.0 (H) 03/12/2020   Lab Results  Component Value Date   LDLCALC 64 02/03/2020  The 10-year ASCVD risk score Mikey Bussing DC Jr., et al., 2013) is: 32.6%   Values used to calculate the score:     Age: 55 years     Sex: Male     Is Non-Hispanic African American: Yes     Diabetic: Yes     Tobacco smoker: Yes     Systolic Blood Pressure: 937 mmHg     Is BP treated: Yes     HDL Cholesterol: 33 mg/dL     Total Cholesterol: 145 mg/dL  Past Medical History:  Diagnosis Date  . Diabetes mellitus without complication ([place])   . Dyslipidemia   . Hyperlipidemia   . Hypertension   . 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     Past Surgical History:  Procedure Laterality Date  . HERNIA REPAIR     " when I was young "  . NO PAST SURGERIES      Family History  Problem Relation Age of Onset  . Diabetes Mother   . Diabetes Father   . Pancreatitis Neg Hx   . Colon cancer Neg Hx   . Stomach cancer Neg Hx     Social History   Socioeconomic History  . Marital  status: Single    Spouse name: Not on file  . Number of children: Not on file  . Years of education: Not on file  . Highest education level: Not on file  Occupational History  . Not on file  Tobacco Use  . Smoking status: Former Smoker    Types: Cigarettes  . Smokeless tobacco: Never Used  . Tobacco comment: Quit smoking 5 years ago  Vaping Use  . Vaping Use: Never used  Substance and Sexual Activity  . Alcohol use: Not Currently    Comment: rare  . Drug use: No  . Sexual activity: Yes  Other Topics Concern  . 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  HENT: Negative.  Negative for nosebleeds.   Eyes: Negative.  Negative for blurred vision, double vision and photophobia.  Respiratory: Negative.  Negative for cough and shortness of breath.   Cardiovascular: Negative.  Negative for chest pain, palpitations and leg swelling.  Gastrointestinal: Negative.  Negative for heartburn, nausea and vomiting.  Musculoskeletal: Negative.  Negative for myalgias.  Neurological: Negative.  Negative for dizziness, focal weakness, seizures and headaches.  Psychiatric/Behavioral: Negative.  Negative for suicidal ideas.    Assessment and Plan: Diagnoses and all orders for this visit:  Type 2 diabetes mellitus with hyperglycemia, without long-term current use of insulin ([place]) -     Discontinue: metFORMIN (GLUCOPHAGE-XR) 500 MG 24 hr tablet; Take 1 tablet (500mg ) in the am and 2 tablets (1000 mg) in the pm with a meal -     Discontinue: glimepiride (AMARYL) 2 MG tablet; Take 1 tablet (2 mg total) by mouth daily with breakfast. Please fill as a 90 day supply. NEEDS PASS -     Discontinue: metFORMIN (GLUCOPHAGE-XR) 500 MG 24 hr tablet; Take 1 tablet (500mg ) by mouth in the am and 2 tablets (1000 mg) by mouth in the pm with a meal. NEEDS PASS -     Discontinue: TRUEplus Lancets 28G MISC; Check blood glucose level by fingerstick twice per day. E11.65. NEEDS PASS -     Insulin Pen Needle (PEN NEEDLES) 30G X 8 MM MISC; Use as instructed. Inject into the skin once daily. NEEDS PASS Continue blood sugar control as discussed in office today, low carbohydrate diet, and regular physical exercise as tolerated, 150 minutes per week (30 min each day, 5 days per week,  or 50 min 3 days per week). Keep blood sugar logs with fasting goal of 90-130 mg/dl, post prandial (after you eat) less than 180.  For Hypoglycemia: BS <60 and Hyperglycemia BS >400; contact the clinic ASAP. Annual eye exams and foot exams are recommended.   Essential hypertension -     Discontinue: losartan (COZAAR) 50 MG tablet; Take 1 tablet (50 mg total) by mouth daily. Please fill as a 90 day supply. NEEDS PASS Continue all antihypertensives as prescribed.  Remember to bring in your blood pressure log with you for your follow up appointment.  DASH/Mediterranean Diets are healthier choices for HTN.    Dyslipidemia,  goal LDL below 70 -     atorvastatin (LIPITOR) 40 MG tablet; Take 1 tablet (40 mg total) by mouth daily. INSTRUCTIONS: Work on a low fat, heart healthy diet and participate in regular aerobic exercise program by working out at least 150 minutes per week; 5 days a week-30 minutes per day. Avoid red meat/beef/steak,  fried foods. junk foods, sodas, sugary drinks, unhealthy snacking, alcohol and smoking.  Drink at least 80 oz of water per day and monitor your carbohydrate intake daily.      Follow Up Instructions Return in about 4 months (around 09/02/2020).     I discussed the assessment and treatment plan with the patient. The patient was provided an opportunity to ask questions and all were answered. The patient agreed with the plan and demonstrated an understanding of the instructions.   The patient was advised to call back or seek an in-person evaluation if the symptoms worsen or if the condition fails to improve as anticipated.  I provided 17 minutes of non-face-to-face time during this encounter including median intraservice time, reviewing previous notes, labs, imaging, medications and explaining diagnosis and management.  Gildardo Pounds, FNP-BC

## 2020-05-04 ENCOUNTER — Other Ambulatory Visit: Payer: Self-pay | Admitting: Nurse Practitioner

## 2020-05-04 DIAGNOSIS — R7989 Other specified abnormal findings of blood chemistry: Secondary | ICD-10-CM

## 2020-05-04 DIAGNOSIS — Z1211 Encounter for screening for malignant neoplasm of colon: Secondary | ICD-10-CM

## 2020-05-04 DIAGNOSIS — I700 Atherosclerosis of aorta: Secondary | ICD-10-CM

## 2020-05-04 MED FILL — TRUEplus LANCETS 28G MISC: 50 days supply | Qty: 100 | Fill #0

## 2020-05-04 MED FILL — TRUEPLUS PEN NDL 31G X 1/4: 31G X 6 MM | 100 days supply | Qty: 100 | Fill #0

## 2020-05-04 MED FILL — XARELTO 20 MG TABLET: 20 | 30 days supply | Qty: 30 | Fill #0

## 2020-05-04 MED FILL — LOSARTAN POTASSIUM 50 MG TA: 50 | 90 days supply | Qty: 90 | Fill #0

## 2020-05-05 ENCOUNTER — Encounter: Payer: Self-pay | Admitting: Physician Assistant

## 2020-05-05 ENCOUNTER — Ambulatory Visit (INDEPENDENT_AMBULATORY_CARE_PROVIDER_SITE_OTHER): Payer: PRIVATE HEALTH INSURANCE | Admitting: Physician Assistant

## 2020-05-05 VITALS — BP 128/82 | HR 80 | Ht 74.0 in | Wt 275.4 lb

## 2020-05-05 DIAGNOSIS — Z1212 Encounter for screening for malignant neoplasm of rectum: Secondary | ICD-10-CM | POA: Diagnosis not present

## 2020-05-05 DIAGNOSIS — Z1211 Encounter for screening for malignant neoplasm of colon: Secondary | ICD-10-CM

## 2020-05-05 DIAGNOSIS — K55069 Acute infarction of intestine, part and extent unspecified: Secondary | ICD-10-CM

## 2020-05-05 NOTE — Patient Instructions (Signed)
If you are age 55 or older, your body mass index should be between 23-30. Your Body mass index is 35.36 kg/m. If this is out of the aforementioned range listed, please consider follow up with your Primary Care Provider.  If you are age 35 or younger, your body mass index should be between 19-25. Your Body mass index is 35.36 kg/m. If this is out of the aformentioned range listed, please consider follow up with your Primary Care Provider.   Follow up in 6 months, we will send you a letter closer to this time.  Thank you for trusting me with your gastrointestinal care!    Ellouise Newer, [place]

## 2020-05-05 NOTE — Progress Notes (Signed)
Chief Complaint: Discuss screening colonoscopy  HPI:    Riley Wiggins is a 55 year old African-American male with a past medical history as listed below including recent acute thrombosis of the SMV branch started on Xarelto in February, who was referred to me by Gildardo Pounds, NP for a screening colonoscopy.      03/12/2020 patient admitted to the hospital for abdominal pain.  CT of the abdomen pelvis with contrast for acute abdominal pain.  This showed an acute thrombosis involving a branch of the superior mesenteric vein seen in the right lower quadrant with surrounding inflammatory changes.  There are enlarged mesenteric lymph nodes in the area as well.  Small nonobstructive right renal calculus.  Aortic atherosclerosis.  Patient was initiated on IV heparin and then discharged on Xarelto with plans to continue this for at least 6 months.    Today, the patient presents to clinic accompanied by his wife and tells me that he has no GI issues.  He is feeling well with no change in bowel habits, further abdominal pain, heartburn or reflux.  Denies a family history of colon cancer.    Denies fever, chills, weight loss or symptoms that awaken him from sleep.  Past Medical History:  Diagnosis Date  . Diabetes mellitus without complication ([place])   . Dyslipidemia   . Hyperlipidemia   . Hypertension   . 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     Past Surgical History:  Procedure Laterality Date  . HERNIA REPAIR     " when I was young "  . NO PAST SURGERIES      Current Outpatient Medications  Medication Sig Dispense Refill  . acetaminophen (TYLENOL) 500 MG tablet Take 1,000 mg by mouth every 6 (six) hours as needed for mild pain.    . Ascorbic Acid (VITAMIN C) 1000 MG tablet Take 1,000 mg by mouth daily.    . Blood Glucose Monitoring Suppl (TRUE METRIX METER) w/Device KIT 1 each by Does not apply  route in the morning and at bedtime. 1 kit 0  . Garlic 0998 MG CAPS Take 1,200 mg by mouth daily.    Marland Kitchen glimepiride (AMARYL) 2 MG tablet Take 1 tablet (2 mg total) by mouth daily with breakfast. Please fill as a 90 day supply. NEEDS PASS 90 tablet 1  . glucose blood (TRUE METRIX BLOOD GLUCOSE TEST) test strip Use as instructed. Check blood glucose level by fingerstick twice per day. E11.65 100 each 12  . insulin glargine (LANTUS SOLOSTAR) 100 UNIT/ML Solostar Pen Inject 18 Units into the skin daily. 15 mL PRN  . Insulin Pen Needle (PEN NEEDLES) 30G X 8 MM MISC Use as instructed. Inject into the skin once daily. NEEDS PASS 100 each 5  . losartan (COZAAR) 50 MG tablet Take 1 tablet (50 mg total) by mouth daily. Please fill as a 90 day supply. NEEDS PASS 90 tablet 3  . metFORMIN (GLUCOPHAGE-XR) 500 MG 24 hr tablet Take 1 tablet (52m) by mouth in the am and 2 tablets (1000 mg) by mouth in the pm with a meal. NEEDS PASS 270 tablet 1  . Multiple Vitamins-Minerals (MULTI FOR HIM 50+) TABS Take 1 tablet by mouth daily.    . rivaroxaban (XARELTO) 20 MG TABS tablet Take 1 tablet (20 mg total) by mouth daily with supper. 30 tablet 4  . TRUEplus Lancets 28G MISC Check blood glucose level by fingerstick  twice per day. E11.65. NEEDS PASS 100 each 1  . warfarin (COUMADIN) 5 MG tablet Take one tablet by mouth daily with Xarelto for the last three days of your Xarelto prescription. After those 3 days are over, take one tablet by mouth daily. 6-7 days after initiation of Warfarin, you will need appointment with [facility] coumadin clinic. 14 tablet 0  . allopurinol (ZYLOPRIM) 100 MG tablet Take 1 tablet (100 mg total) by mouth daily. 90 tablet 1   No current facility-administered medications for this visit.    Allergies as of 05/05/2020  . (No Known Allergies)    Family History  Problem Relation Age of Onset  . Diabetes Mother   . Diabetes Father   . Pancreatitis Neg Hx   . Colon cancer Neg Hx   . Stomach  cancer Neg Hx     Social History   Socioeconomic History  . Marital status: Single    Spouse name: Not on file  . Number of children: Not on file  . Years of education: Not on file  . Highest education level: Not on file  Occupational History  . Not on file  Tobacco Use  . Smoking status: Former Smoker    Types: Cigarettes  . Smokeless tobacco: Never Used  . Tobacco comment: Quit smoking 5 years ago  Vaping Use  . Vaping Use: Never used  Substance and Sexual Activity  . Alcohol use: Not Currently    Comment: rare  . Drug use: No  . Sexual activity: Yes  Other Topics Concern  . Not on file  Social History Narrative   Works at truckers   Lives in [place] with girlfriend    Social Determinants of Radio broadcast assistant Strain: Not on file  Food Insecurity: Not on file  Transportation Needs: Not on file  Physical Activity: Not on file  Stress: Not on file  Social Connections: Not on file  Intimate Partner Violence: Not on file    Review of Systems:    Constitutional: No weight loss, fever or chills Skin: No rash  Cardiovascular: No chest pain Respiratory: No SOB Gastrointestinal: See HPI and otherwise negative Genitourinary: No dysuria  Neurological: No headache, dizziness or syncope Musculoskeletal: No new muscle or joint pain Hematologic: No bleeding Psychiatric: No history of depression or anxiety   Physical Exam:  Vital signs: BP 128/82   Pulse 80   Ht _0  (1.88 m)   Wt 275 lb 6 oz (124.9 kg)   BMI 35.36 kg/m   Constitutional:   Pleasant AA male appears to be in NAD, Well developed, Well nourished, alert and cooperative Head:  Normocephalic and atraumatic. Eyes:   PEERL, EOMI. No icterus. Conjunctiva pink. Ears:  Normal auditory acuity. Neck:  Supple Throat: Oral cavity and pharynx without inflammation, swelling or lesion.  Respiratory: Respirations even and unlabored. Lungs clear to auscultation bilaterally.   No wheezes, crackles, or  rhonchi.  Cardiovascular: Normal S1, S2. No MRG. Regular rate and rhythm. No peripheral edema, cyanosis or pallor.  Gastrointestinal:  Soft, nondistended, nontender. No rebound or guarding. Normal bowel sounds. No appreciable masses or hepatomegaly. Rectal:  Not performed.  Msk:  Symmetrical without gross deformities. Without edema, no deformity or joint abnormality.  Neurologic:  Alert and  oriented x4;  grossly normal neurologically.  Skin:   Dry and intact without significant lesions or rashes. Psychiatric:  Demonstrates good judgement and reason without abnormal affect or behaviors.  RELEVANT LABS AND IMAGING: CBC  Component Value Date/Time   WBC 10.0 04/07/2020 0906   WBC 10.5 03/15/2020 0148   RBC 4.36 04/07/2020 0906   RBC 3.59 (L) 03/15/2020 0148   HGB 14.0 04/07/2020 0906   HCT 40.9 04/07/2020 0906   PLT 189 04/07/2020 0906   MCV 94 04/07/2020 0906   MCV 101 (H) 05/10/2014 1321   MCH 32.1 04/07/2020 0906   MCH 32.3 03/15/2020 0148   MCHC 34.2 04/07/2020 0906   MCHC 34.2 03/15/2020 0148   RDW 14.5 04/07/2020 0906   RDW 12.9 05/10/2014 1321   LYMPHSABS 3.9 (H) 04/07/2020 0906   LYMPHSABS 2.3 05/10/2014 1321   MONOABS 0.9 03/15/2020 0148   MONOABS 1.2 (H) 05/10/2014 1321   EOSABS 0.3 04/07/2020 0906   EOSABS 0.3 05/10/2014 1321   BASOSABS 0.1 04/07/2020 0906   BASOSABS 0 05/10/2014 1444   BASOSABS 0.1 05/10/2014 1321    CMP     Component Value Date/Time   NA 139 03/17/2020 0950   NA 136 05/10/2014 1321   K 4.8 03/17/2020 0950   K 3.8 05/10/2014 1321   CL 98 03/17/2020 0950   CL 96 (L) 05/10/2014 1321   CO2 22 03/17/2020 0950   CO2 29 05/10/2014 1321   GLUCOSE 159 (H) 03/17/2020 0950   GLUCOSE 169 (H) 03/14/2020 0456   GLUCOSE 145 (H) 05/10/2014 1321   BUN 12 03/17/2020 0950   BUN 13 05/10/2014 1321   CREATININE 0.91 03/17/2020 0950   CREATININE 0.76 05/10/2014 1321   CALCIUM 10.5 (H) 03/17/2020 0950   CALCIUM 10.3 05/10/2014 1321   PROT 7.9  03/17/2020 0950   ALBUMIN 4.1 03/17/2020 0950   AST 30 03/17/2020 0950   ALT 52 (H) 03/17/2020 0950   ALKPHOS 152 (H) 03/17/2020 0950   BILITOT 0.3 03/17/2020 0950   GFRNONAA 95 03/17/2020 0950   GFRNONAA >60 03/14/2020 0456   GFRNONAA >60 05/10/2014 1321   GFRAA 110 03/17/2020 0950   GFRAA >60 05/10/2014 1321    Assessment: 1.  Screening for colorectal cancer: Patient is 77 and never had a screening colonoscopy 2.  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  Patient is currently having no GI issues, no weight loss, no blood in his stool, no change in bowel habits, no family history of colon cancer.  I believe that we can wait for his screening colonoscopy until it is safe for him to hold his anticoagulation.  Per review of notes it appears they want him on Xarelto for 6 months straight, this would put [place] in at least August.  This is as long as everything goes well. 2.  Told patient that we will put him in for recall appointment in August so that we can see how he is doing then and discuss holding his anticoagulation for colonoscopy. 3.  Patient assigned to Dr. Havery Moros this morning.  Ellouise Newer, PA-C [facility] 05/05/2020, 8:52 AM  Cc: Gildardo Pounds, NP

## 2020-05-06 NOTE — Progress Notes (Signed)
Agree with assessment as outlined. If no lower tract symptoms would agree to complete treatment of anticoagulation for recent thrombosis and perform routine screening colonoscopy later this summer. He can follow up with [place] at that time.

## 2020-05-07 ENCOUNTER — Other Ambulatory Visit: Payer: Self-pay

## 2020-05-07 ENCOUNTER — Encounter: Payer: Self-pay | Admitting: Nurse Practitioner

## 2020-05-07 MED ORDER — ATORVASTATIN CALCIUM 40 MG PO TABS
40.0000 mg | ORAL_TABLET | Freq: Every day | ORAL | 3 refills | Status: DC
  Filled 2020-05-07: qty 30, 30d supply, fill #0
  Filled 2020-05-16: qty 90, 90d supply, fill #0
  Filled 2020-08-14: qty 30, 30d supply, fill #1
  Filled 2020-08-22: qty 90, 90d supply, fill #1
  Filled 2020-11-22: qty 30, 30d supply, fill #2
  Filled 2020-12-30: qty 30, 30d supply, fill #3

## 2020-05-08 ENCOUNTER — Other Ambulatory Visit: Payer: Self-pay

## 2020-05-12 ENCOUNTER — Other Ambulatory Visit: Payer: Self-pay

## 2020-05-16 ENCOUNTER — Other Ambulatory Visit: Payer: Self-pay

## 2020-05-16 ENCOUNTER — Telehealth: Payer: Self-pay | Admitting: Internal Medicine

## 2020-05-16 MED FILL — Metformin HCl Tab ER 24HR 500 MG: ORAL | 90 days supply | Qty: 270 | Fill #0 | Status: AC

## 2020-05-16 MED FILL — Glimepiride Tab 2 MG: ORAL | 90 days supply | Qty: 90 | Fill #0 | Status: AC

## 2020-05-16 NOTE — Telephone Encounter (Signed)
Called and spoke with patient in regards to canceled Echo appointment.  He wants to know if he should go ahead and wear his heart monitor.  I advised him to go ahead and put the monitor on.  He will make sure that Echo is scheduled for at least 14 days out.

## 2020-05-16 NOTE — Telephone Encounter (Signed)
PT is calling due to having to reschedule echo.He is wanting to confirm if he should reschedule before or after wearing heart monitor. He received it in the mail but has not put it on yet.Please advise.

## 2020-05-19 ENCOUNTER — Other Ambulatory Visit (HOSPITAL_COMMUNITY): Payer: PRIVATE HEALTH INSURANCE

## 2020-05-19 ENCOUNTER — Other Ambulatory Visit: Payer: PRIVATE HEALTH INSURANCE

## 2020-05-19 ENCOUNTER — Other Ambulatory Visit: Payer: Self-pay

## 2020-05-19 MED FILL — Glucose Blood Test Strip: 50 days supply | Qty: 100 | Fill #0 | Status: CN

## 2020-05-26 ENCOUNTER — Other Ambulatory Visit: Payer: Self-pay

## 2020-05-30 ENCOUNTER — Other Ambulatory Visit: Payer: Self-pay

## 2020-05-30 MED FILL — Rivaroxaban Tab 20 MG: ORAL | 30 days supply | Qty: 30 | Fill #0 | Status: CN

## 2020-06-06 ENCOUNTER — Other Ambulatory Visit: Payer: Self-pay

## 2020-06-06 MED FILL — Glucose Blood Test Strip: 25 days supply | Qty: 100 | Fill #0 | Status: AC

## 2020-06-06 MED FILL — Rivaroxaban Tab 20 MG: ORAL | 30 days supply | Qty: 30 | Fill #0 | Status: AC

## 2020-06-20 ENCOUNTER — Other Ambulatory Visit (HOSPITAL_COMMUNITY): Payer: PRIVATE HEALTH INSURANCE

## 2020-06-22 ENCOUNTER — Other Ambulatory Visit: Payer: Self-pay

## 2020-06-23 ENCOUNTER — Other Ambulatory Visit: Payer: Self-pay

## 2020-07-06 ENCOUNTER — Other Ambulatory Visit: Payer: Self-pay

## 2020-07-06 MED FILL — Rivaroxaban Tab 20 MG: ORAL | 30 days supply | Qty: 30 | Fill #1 | Status: AC

## 2020-07-06 MED FILL — Metformin HCl Tab ER 24HR 500 MG: ORAL | 90 days supply | Qty: 270 | Fill #1 | Status: AC

## 2020-07-19 ENCOUNTER — Ambulatory Visit (HOSPITAL_COMMUNITY): Payer: PRIVATE HEALTH INSURANCE | Attending: Internal Medicine

## 2020-07-20 ENCOUNTER — Encounter (HOSPITAL_COMMUNITY): Payer: Self-pay | Admitting: Internal Medicine

## 2020-07-26 ENCOUNTER — Other Ambulatory Visit: Payer: Self-pay

## 2020-07-26 MED FILL — Allopurinol Tab 100 MG: ORAL | 90 days supply | Qty: 90 | Fill #0 | Status: AC

## 2020-07-26 MED FILL — Losartan Potassium Tab 50 MG: ORAL | 90 days supply | Qty: 90 | Fill #0 | Status: CN

## 2020-07-27 ENCOUNTER — Other Ambulatory Visit: Payer: Self-pay

## 2020-07-27 MED FILL — Glucose Blood Test Strip: 50 days supply | Qty: 100 | Fill #1 | Status: CN

## 2020-08-02 ENCOUNTER — Other Ambulatory Visit: Payer: Self-pay

## 2020-08-03 ENCOUNTER — Other Ambulatory Visit: Payer: Self-pay

## 2020-08-03 ENCOUNTER — Telehealth (HOSPITAL_COMMUNITY): Payer: Self-pay | Admitting: Internal Medicine

## 2020-08-03 NOTE — Telephone Encounter (Signed)
Noted ./cy 

## 2020-08-03 NOTE — Telephone Encounter (Signed)
Just an FYI. We have made several attempts to contact this patient including sending a letter to schedule or reschedule their echocardiogram. We will be removing the patient from the echo WQ.   07/20/20 NO SHOWED-MAILED LETTER [facility]      Thank you

## 2020-08-07 ENCOUNTER — Encounter (HOSPITAL_COMMUNITY): Payer: Self-pay

## 2020-08-07 ENCOUNTER — Emergency Department (HOSPITAL_COMMUNITY)
Admission: EM | Admit: 2020-08-07 | Discharge: 2020-08-07 | Disposition: A | Discharge status: Home / Self Care | Payer: PRIVATE HEALTH INSURANCE | Attending: Emergency Medicine | Admitting: Emergency Medicine

## 2020-08-07 ENCOUNTER — Other Ambulatory Visit: Payer: Self-pay

## 2020-08-07 DIAGNOSIS — S8991XA Unspecified injury of right lower leg, initial encounter: Secondary | ICD-10-CM | POA: Insufficient documentation

## 2020-08-07 DIAGNOSIS — Z79899 Other long term (current) drug therapy: Secondary | ICD-10-CM | POA: Insufficient documentation

## 2020-08-07 DIAGNOSIS — T148XXA Other injury of unspecified body region, initial encounter: Secondary | ICD-10-CM

## 2020-08-07 DIAGNOSIS — E1110 Type 2 diabetes mellitus with ketoacidosis without coma: Secondary | ICD-10-CM | POA: Insufficient documentation

## 2020-08-07 DIAGNOSIS — Z794 Long term (current) use of insulin: Secondary | ICD-10-CM | POA: Insufficient documentation

## 2020-08-07 DIAGNOSIS — Z7984 Long term (current) use of oral hypoglycemic drugs: Secondary | ICD-10-CM | POA: Insufficient documentation

## 2020-08-07 DIAGNOSIS — Z87891 Personal history of nicotine dependence: Secondary | ICD-10-CM | POA: Insufficient documentation

## 2020-08-07 DIAGNOSIS — Z7901 Long term (current) use of anticoagulants: Secondary | ICD-10-CM

## 2020-08-07 DIAGNOSIS — W228XXA Striking against or struck by other objects, initial encounter: Secondary | ICD-10-CM | POA: Insufficient documentation

## 2020-08-07 DIAGNOSIS — I10 Essential (primary) hypertension: Secondary | ICD-10-CM | POA: Insufficient documentation

## 2020-08-07 NOTE — ED Triage Notes (Signed)
Pt states his belt buckle cut his right leg at approx 9 am and bleeding has not been controlled. Dressing changed during triage.

## 2020-08-07 NOTE — ED Provider Notes (Signed)
[facility] EMERGENCY DEPARTMENT Provider Note   CSN: [id number] Arrival date & time: 08/07/20  2006     History Chief Complaint  Patient presents with   Extremity Laceration    Riley Wiggins is a 55 y.o. male.  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  Patient presenting with complaints of bleeding from his right leg.  He states he was putting on his pants this morning and cut his leg with his belt buckle.  He has had difficulty getting the bleeding to stop since.  The history is provided by the patient.      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   Type 2 diabetes mellitus with hyperglycemia, without long-term current use of insulin ([place]) 02/03/2020   Hypertension associated with diabetes ([place]) 08/04/2019   Aortic atherosclerosis ([place]) 08/04/2019   Dyslipidemia 08/04/2019   High anion gap metabolic acidosis 76/22/6333   Hypertriglyceridemia 08/04/2019   Idiopathic acute pancreatitis 08/03/2019   Diabetic ketoacidosis without coma associated with type 2 diabetes mellitus ([place])     Past Surgical History:  Procedure Laterality Date   HERNIA REPAIR     " when I was young "   NO PAST SURGERIES         Family History  Problem Relation Age of Onset   Diabetes Mother    Diabetes Father    Pancreatitis Neg Hx    Colon cancer Neg Hx    Stomach cancer Neg Hx     Social History   Tobacco Use   Smoking status: Former    Pack years: 0.00    Types: Cigarettes   Smokeless tobacco: Never   Tobacco comments:    Quit smoking 5 years ago   Vaping Use   Vaping Use: Never used  Substance Use Topics   Alcohol use: Not Currently    Comment: rare   Drug use: No    Home Medications Prior to Admission medications   Medication Sig Start Date End Date Taking? Authorizing Provider  acetaminophen (TYLENOL) 500 MG tablet Take 1,000 mg by mouth every 6 (six) hours as needed for mild pain.    [provider]  allopurinol (ZYLOPRIM) 100 MG tablet Take 1 tablet (100 mg total) by mouth daily. 02/03/20 05/03/20  Argentina Donovan, PA-C  allopurinol (ZYLOPRIM) 100 MG tablet TAKE 1 TABLET (100 MG TOTAL) BY MOUTH DAILY. 09/16/19 10/25/20  Gildardo Pounds, NP  amoxicillin-clavulanate (AUGMENTIN) 875-125 MG tablet TAKE 1 TABLET BY MOUTH EVERY TWELVE HOURS FOR 3 DAYS. 03/15/20 03/15/21  Mercy Riding, MD  Ascorbic Acid (VITAMIN C) 1000 MG tablet Take 1,000 mg by mouth daily.    [provider]  atorvastatin (LIPITOR) 40 MG tablet Take 1 tablet (40 mg total) by mouth daily. 05/07/20   Gildardo Pounds, NP  Blood Glucose Monitoring Suppl (TRUE METRIX METER) w/Device KIT USE AS DIRECTED IN THE MORNING AND AT BEDTIME 02/03/20 02/02/21  Argentina Donovan, PA-C  Garlic 5456 MG CAPS Take 1,200 mg by mouth daily.    [provider]  glimepiride (AMARYL) 2  MG tablet TAKE 1 TABLET (2 MG TOTAL) BY MOUTH DAILY WITH BREAKFAST. 05/03/20 05/03/21  Gildardo Pounds, NP  glucose blood test strip USE AS DIRECTED TO CHECK BLOOD GLUCOSE TWICE DAILY 02/03/20 02/02/21  Argentina Donovan, PA-C  insulin glargine (LANTUS SOLOSTAR) 100 UNIT/ML Solostar Pen Inject 18 Units into the skin daily. 03/17/20   Argentina Donovan, PA-C  Insulin Pen Needle (PEN NEEDLES) 30G X 8 MM MISC Use as instructed. Inject into the skin once daily. NEEDS PASS 05/03/20   Gildardo Pounds, NP  losartan (COZAAR) 50 MG tablet TAKE 1 TABLET (50 MG TOTAL) BY MOUTH DAILY. 05/03/20 05/03/21  Gildardo Pounds, NP  metFORMIN (GLUCOPHAGE-XR) 500 MG 24 hr tablet TAKE 1 TABLET ($RemoveB'500MG'NEVthUAk$ ) BY MOUTH IN  THE AM AND 2 TABLETS (1000 MG) BY MOUTH IN THE PM WITH A MEAL. 05/03/20 05/03/21  Gildardo Pounds, NP  Multiple Vitamins-Minerals (MULTI FOR HIM 50+) TABS Take 1 tablet by mouth daily.    [provider]  rivaroxaban (XARELTO) 20 MG TABS tablet TAKE 1 TABLET (20 MG TOTAL) BY MOUTH DAILY WITH SUPPER. 03/17/20 03/17/21  McClung, Dionne Bucy, PA-C  TRUEplus Lancets 28G MISC CHECK BLOOD GLUCOSE LEVEL BY FINGERSTICK TWICE PER DAY. E11.65. 05/03/20 05/03/21  Gildardo Pounds, NP  warfarin (COUMADIN) 5 MG tablet TAKE ONE TABLET BY MOUTH DAILY WITH XARELTO FOR THE LAST THREE DAYS OF YOUR XARELTO PRESCRIPTION. AFTER THOSE 3 DAYS ARE OVER, TAKE ONE TA<MORE> 04/14/20 04/14/21  Marty Heck, MD    Allergies    Patient has no known allergies.  Review of Systems   Review of Systems  All other systems reviewed and are negative.  Physical Exam Updated Vital Signs BP 135/89 (BP Location: Right Arm)   Pulse 82   Temp 98.6 F (37 C) (Oral)   Resp 18   Ht $R'6\' 2"'As$  (1.88 m)   Wt 127 kg   SpO2 97%   BMI 35.95 kg/m   Physical Exam Vitals and nursing note reviewed.  Constitutional:      General: He is not in acute distress.    Appearance: Normal appearance. He is not ill-appearing.  HENT:     Head: Normocephalic and atraumatic.  Skin:    General: Skin is warm and dry.     Comments: There is a punctate area of persistent oozing to the right mid anterior shin region.  There is no arterial bleeding.  Neurological:     Mental Status: He is alert and oriented to person, place, and time.    ED Results / Procedures / Treatments   Labs (all labs ordered are listed, but only abnormal results are displayed) Labs Reviewed - No data to display  EKG None  Radiology No results found.  Procedures Procedures   Medications Ordered in ED Medications - No data to display  ED Course  I have reviewed the triage vital signs and the nursing notes.  Pertinent labs & imaging results that were  available during my care of the patient were reviewed by me and considered in my medical decision making (see chart for details).    MDM Rules/Calculators/A&P  Patient presenting here with bleeding from a small perforation in the skin to the right lower leg.  Nurse applied a Coban dressing in triage and bleeding has been controlled.  This was removed and the wound inspected.  There is no arterial bleeding.  This appears to be a small varicosity that is persistently oozing blood made worse by  his Xarelto.  The compression dressing was reapplied and patient to be discharged with wound care and return as needed.  Final Clinical Impression(s) / ED Diagnoses Final diagnoses:  None    Rx / DC Orders ED Discharge Orders     None        Veryl Speak, MD 08/07/20 2315

## 2020-08-07 NOTE — Discharge Instructions (Addendum)
Leave dressing in place for the next 24 hours, then remove.  Reapply dressing as needed using supplies given.  Return to the emergency department if symptoms worsen or change.

## 2020-08-08 ENCOUNTER — Other Ambulatory Visit: Payer: Self-pay | Admitting: Nurse Practitioner

## 2020-08-08 DIAGNOSIS — E1165 Type 2 diabetes mellitus with hyperglycemia: Secondary | ICD-10-CM

## 2020-08-09 ENCOUNTER — Other Ambulatory Visit: Payer: Self-pay

## 2020-08-09 MED ORDER — METFORMIN HCL ER 500 MG PO TB24
ORAL_TABLET | ORAL | 0 refills | Status: DC
  Filled 2020-08-09: qty 90, fill #0
  Filled 2020-08-30: qty 90, 30d supply, fill #0

## 2020-08-09 MED FILL — Rivaroxaban Tab 20 MG: ORAL | 30 days supply | Qty: 30 | Fill #2 | Status: AC

## 2020-08-09 NOTE — Telephone Encounter (Signed)
Notes to clinic:  verify directions for refill    Requested Prescriptions  Pending Prescriptions Disp Refills   metFORMIN (GLUCOPHAGE-XR) 500 MG 24 hr tablet 270 tablet 1      Endocrinology:  Diabetes - Biguanides Failed - 08/08/2020  1:17 PM      Failed - HBA1C is between 0 and 7.9 and within 180 days    HbA1c, POC (controlled diabetic range)  Date Value Ref Range Status  02/03/2020 13.1 (A) 0.0 - 7.0 % Final   Hgb A1c MFr Bld  Date Value Ref Range Status  03/12/2020 11.0 (H) 4.8 - 5.6 % Final    Comment:    (NOTE) Pre diabetes:          5.7%-6.4%  Diabetes:              >6.4%  Glycemic control for   <7.0% adults with diabetes           Passed - Cr in normal range and within 360 days    Creatinine  Date Value Ref Range Status  05/10/2014 0.76 mg/dL Final    Comment:    0.61-1.24 NOTE: New Reference Range  04/13/14    Creatinine, Ser  Date Value Ref Range Status  03/17/2020 0.91 0.76 - 1.27 mg/dL Final          Passed - AA eGFR in normal range and within 360 days    EGFR (African American)  Date Value Ref Range Status  05/10/2014 >60  Final   GFR calc Af Amer  Date Value Ref Range Status  03/17/2020 110 >59 mL/min/1.73 Final    Comment:    **In accordance with recommendations from the NKF-ASN Task force,**   [facility] is in the process of updating its eGFR calculation to the   2021 CKD-EPI creatinine equation that estimates kidney function   without a race variable.    EGFR (Non-African Amer.)  Date Value Ref Range Status  05/10/2014 >60  Final    Comment:    eGFR values <68m/min/1.73 m2 may be an indication of chronic kidney disease (CKD). Calculated eGFR is useful in patients with stable renal function. The eGFR calculation will not be reliable in acutely ill patients when serum creatinine is changing rapidly. It is not useful in patients on dialysis. The eGFR calculation may not be applicable to patients at the low and high extremes of body  sizes, pregnant women, and vegetarians.    GFR, Estimated  Date Value Ref Range Status  03/14/2020 >60 >60 mL/min Final    Comment:    (NOTE) Calculated using the CKD-EPI Creatinine Equation (2021)    GFR calc non Af Amer  Date Value Ref Range Status  03/17/2020 95 >59 mL/min/1.73 Final          Passed - Valid encounter within last 6 months    Recent Outpatient Visits           3 months ago Type 2 diabetes mellitus with hyperglycemia, without long-term current use of insulin ([facility]   [place], [place]   4 months ago Type 2 diabetes mellitus with hyperglycemia, without long-term current use of insulin ([facility]   [place], RPH-CPP   4 months ago Type 2 diabetes mellitus with hyperglycemia, without long-term current use of insulin ([facility]   [place]  5 months ago Type 2 diabetes  mellitus with hyperglycemia, without long-term current use of insulin [facility])   [place], Annie Main L, RPH-CPP   6 months ago Type 2 diabetes mellitus with hyperglycemia, without long-term current use of insulin [facility])   [place], RPH-CPP

## 2020-08-10 ENCOUNTER — Other Ambulatory Visit: Payer: Self-pay

## 2020-08-14 MED FILL — Glimepiride Tab 2 MG: ORAL | 90 days supply | Qty: 90 | Fill #1 | Status: CN

## 2020-08-14 MED FILL — Losartan Potassium Tab 50 MG: ORAL | 30 days supply | Qty: 30 | Fill #0 | Status: CN

## 2020-08-15 ENCOUNTER — Other Ambulatory Visit: Payer: Self-pay

## 2020-08-22 ENCOUNTER — Other Ambulatory Visit: Payer: Self-pay

## 2020-08-22 MED FILL — Losartan Potassium Tab 50 MG: ORAL | 90 days supply | Qty: 90 | Fill #0 | Status: CN

## 2020-08-22 MED FILL — Glimepiride Tab 2 MG: ORAL | 90 days supply | Qty: 90 | Fill #1 | Status: AC

## 2020-08-22 MED FILL — Glucose Blood Test Strip: 25 days supply | Qty: 100 | Fill #1 | Status: AC

## 2020-08-26 ENCOUNTER — Other Ambulatory Visit: Payer: Self-pay

## 2020-08-29 ENCOUNTER — Other Ambulatory Visit: Payer: Self-pay

## 2020-08-30 ENCOUNTER — Other Ambulatory Visit: Payer: Self-pay

## 2020-09-02 ENCOUNTER — Other Ambulatory Visit: Payer: Self-pay

## 2020-09-06 ENCOUNTER — Other Ambulatory Visit: Payer: Self-pay

## 2020-09-13 ENCOUNTER — Other Ambulatory Visit: Payer: Self-pay

## 2020-09-15 ENCOUNTER — Telehealth: Payer: Self-pay | Admitting: Nurse Practitioner

## 2020-09-15 NOTE — Telephone Encounter (Signed)
Listing has been mailed to patient

## 2020-09-15 NOTE — Telephone Encounter (Signed)
Copied from [place] [phone]. Topic: Referral - Request for Referral >> Sep 13, 2020 10:32 AM Alanda Slim E wrote: Has patient seen PCP for this complaint? Yes *If NO, is insurance requiring patient see PCP for this issue before PCP can refer them? Referral for which specialty: optometry   Preferred provider/office: [facility] / fax# [phone] phone# [phone]  Reason for referral: diabetic

## 2020-09-20 NOTE — Telephone Encounter (Signed)
Patient called in to inquire on the status of the referral requested. Asking for a call back at Ph# [phone]

## 2020-09-25 ENCOUNTER — Other Ambulatory Visit: Payer: Self-pay | Admitting: Family Medicine

## 2020-09-25 DIAGNOSIS — E1165 Type 2 diabetes mellitus with hyperglycemia: Secondary | ICD-10-CM

## 2020-09-25 NOTE — Telephone Encounter (Signed)
Requested medication (s) are due for refill today: yes  Requested medication (s) are on the active medication list: yes  Last refill:  08/09/20  Future visit scheduled: no  Notes to clinic:  called pt and LM on VM to call office to make appt- noted on last RF that "must have office visit for refills"   Requested Prescriptions  Pending Prescriptions Disp Refills   metFORMIN (GLUCOPHAGE-XR) 500 MG 24 hr tablet 90 tablet 0    Sig: TAKE 1 TABLET ($RemoveB'500MG'FpAllQQQ$ ) BY MOUTH IN THE AM AND 2 TABLETS (1000 MG) BY MOUTH IN THE PM WITH A MEAL.     Endocrinology:  Diabetes - Biguanides Failed - 09/25/2020  8:14 AM      Failed - HBA1C is between 0 and 7.9 and within 180 days    HbA1c, POC (controlled diabetic range)  Date Value Ref Range Status  02/03/2020 13.1 (A) 0.0 - 7.0 % Final   Hgb A1c MFr Bld  Date Value Ref Range Status  03/12/2020 11.0 (H) 4.8 - 5.6 % Final    Comment:    (NOTE) Pre diabetes:          5.7%-6.4%  Diabetes:              >6.4%  Glycemic control for   <7.0% adults with diabetes           Passed - Cr in normal range and within 360 days    Creatinine  Date Value Ref Range Status  05/10/2014 0.76 mg/dL Final    Comment:    0.61-1.24 NOTE: New Reference Range  04/13/14    Creatinine, Ser  Date Value Ref Range Status  03/17/2020 0.91 0.76 - 1.27 mg/dL Final          Passed - AA eGFR in normal range and within 360 days    EGFR (African American)  Date Value Ref Range Status  05/10/2014 >60  Final   GFR calc Af Amer  Date Value Ref Range Status  03/17/2020 110 >59 mL/min/1.73 Final    Comment:    **In accordance with recommendations from the NKF-ASN Task force,**   [facility] is in the process of updating its eGFR calculation to the   2021 CKD-EPI creatinine equation that estimates kidney function   without a race variable.    EGFR (Non-African Amer.)  Date Value Ref Range Status  05/10/2014 >60  Final    Comment:    eGFR values <54mL/min/1.73 m2 may be an  indication of chronic kidney disease (CKD). Calculated eGFR is useful in patients with stable renal function. The eGFR calculation will not be reliable in acutely ill patients when serum creatinine is changing rapidly. It is not useful in patients on dialysis. The eGFR calculation may not be applicable to patients at the low and high extremes of body sizes, pregnant women, and vegetarians.    GFR, Estimated  Date Value Ref Range Status  03/14/2020 >60 >60 mL/min Final    Comment:    (NOTE) Calculated using the CKD-EPI Creatinine Equation (2021)    GFR calc non Af Amer  Date Value Ref Range Status  03/17/2020 95 >59 mL/min/1.73 Final          Passed - Valid encounter within last 6 months    Recent Outpatient Visits           4 months ago Type 2 diabetes mellitus with hyperglycemia, without long-term current use of insulin ([place])   [place]  Gildardo Pounds, NP   6 months ago Type 2 diabetes mellitus with hyperglycemia, without long-term current use of insulin [facility])   [place], Annie Main L, RPH-CPP   6 months ago Type 2 diabetes mellitus with hyperglycemia, without long-term current use of insulin [facility])   [place], [place], [place]   7 months ago Type 2 diabetes mellitus with hyperglycemia, without long-term current use of insulin [facility])   [place], Annie Main L, RPH-CPP   7 months ago Type 2 diabetes mellitus with hyperglycemia, without long-term current use of insulin [facility])   [place], RPH-CPP

## 2020-09-27 ENCOUNTER — Other Ambulatory Visit: Payer: Self-pay

## 2020-09-27 MED FILL — Losartan Potassium Tab 50 MG: ORAL | 30 days supply | Qty: 30 | Fill #0 | Status: AC

## 2020-09-30 ENCOUNTER — Other Ambulatory Visit: Payer: Self-pay

## 2020-10-03 ENCOUNTER — Other Ambulatory Visit: Payer: Self-pay | Admitting: Pharmacist

## 2020-10-03 ENCOUNTER — Other Ambulatory Visit: Payer: Self-pay

## 2020-10-03 DIAGNOSIS — E1165 Type 2 diabetes mellitus with hyperglycemia: Secondary | ICD-10-CM

## 2020-10-03 MED ORDER — METFORMIN HCL ER 500 MG PO TB24
ORAL_TABLET | ORAL | 0 refills | Status: DC
  Filled 2020-10-03 – 2020-10-16 (×2): qty 90, 30d supply, fill #0

## 2020-10-11 ENCOUNTER — Other Ambulatory Visit: Payer: Self-pay

## 2020-10-17 ENCOUNTER — Other Ambulatory Visit: Payer: Self-pay

## 2020-10-17 MED FILL — Glucose Blood Test Strip: 25 days supply | Qty: 100 | Fill #2 | Status: AC

## 2020-10-19 ENCOUNTER — Other Ambulatory Visit: Payer: Self-pay

## 2020-10-19 ENCOUNTER — Ambulatory Visit: Payer: Self-pay | Attending: Nurse Practitioner | Admitting: Nurse Practitioner

## 2020-10-19 ENCOUNTER — Other Ambulatory Visit: Payer: Self-pay | Admitting: Pharmacist

## 2020-10-19 DIAGNOSIS — D72829 Elevated white blood cell count, unspecified: Secondary | ICD-10-CM

## 2020-10-19 DIAGNOSIS — Z1211 Encounter for screening for malignant neoplasm of colon: Secondary | ICD-10-CM

## 2020-10-19 DIAGNOSIS — E1165 Type 2 diabetes mellitus with hyperglycemia: Secondary | ICD-10-CM

## 2020-10-19 DIAGNOSIS — M1A9XX0 Chronic gout, unspecified, without tophus (tophi): Secondary | ICD-10-CM

## 2020-10-19 DIAGNOSIS — I10 Essential (primary) hypertension: Secondary | ICD-10-CM

## 2020-10-19 DIAGNOSIS — E785 Hyperlipidemia, unspecified: Secondary | ICD-10-CM

## 2020-10-19 DIAGNOSIS — Z1159 Encounter for screening for other viral diseases: Secondary | ICD-10-CM

## 2020-10-19 MED ORDER — METFORMIN HCL ER 500 MG PO TB24
1000.0000 mg | ORAL_TABLET | Freq: Every day | ORAL | 2 refills | Status: DC
  Filled 2020-10-19 – 2020-10-31 (×2): qty 60, 30d supply, fill #0
  Filled 2020-11-07: qty 180, 90d supply, fill #0

## 2020-10-19 MED ORDER — METFORMIN HCL ER (MOD) 1000 MG PO TB24
1000.0000 mg | ORAL_TABLET | Freq: Every day | ORAL | 1 refills | Status: DC
  Filled 2020-10-19: qty 90, 90d supply, fill #0

## 2020-10-19 NOTE — Progress Notes (Signed)
Virtual Visit via Telephone Note Due to national recommendations of social distancing due to [place] 19, telehealth visit is felt to be most appropriate for this patient at this time.  I discussed the limitations, risks, security and privacy concerns of performing an evaluation and management service by telephone and the availability of in person appointments. I also discussed with the patient that there may be a patient responsible charge related to this service. The patient expressed understanding and agreed to proceed.    I connected with Riley Wiggins on 10/19/20  at   2:30 PM EDT  EDT by telephone and verified that I am speaking with the correct person using two identifiers.  Location of Patient: Private Residence   Location of Provider: [place] and [employer] Office    Persons participating in Telemedicine visit: [name] FNP-BC [name]    History of Present Illness: Telemedicine visit for: DM 2, HTN , HPL He has a past medical history of DM2, Dyslipidemia, Hyperlipidemia, Hypertension, and Thrombosis (03/15/2020).   DM 2 Poorly controlled. Fasting readings as high as 300s.  Taking metformin XR 500 mg in the am and 1000 mg in the pm. 1000 mg daily, He started administering lantus in the evenings. Will increase to 1000 mg BID. He is currently taking glimepiride 2 mg daily and lantus 18 units daily. LDL at goal with atorvastatin 40 mg daily. Blood pressure is well controlled with losartan 50 mg daily. Denies chest pain, shortness of breath, palpitations, lightheadedness, dizziness, headaches or BLE edema.   Lab Results  Component Value Date   HGBA1C 11.0 (H) 03/12/2020    Lab Results  Component Value Date   LDLCALC 64 02/03/2020   BP Readings from Last 3 Encounters:  08/07/20 135/89  05/05/20 128/82  04/14/20 122/80     Past Medical History:  Diagnosis Date   Diabetes mellitus without complication ([place])    Dyslipidemia    Hyperlipidemia     Hypertension    Thrombosis 03/15/2020   acute thrombosis of SMV branch to RLQ with surrounding inflammatory changes and enlarged mesenteric lymph nodes.     Past Surgical History:  Procedure Laterality Date   HERNIA REPAIR     " when I was young "   NO PAST SURGERIES      Family History  Problem Relation Age of Onset   Diabetes Mother    Diabetes Father    Pancreatitis Neg Hx    Colon cancer Neg Hx    Stomach cancer Neg Hx     Social History   Socioeconomic History   Marital status: Single    Spouse name: Not on file   Number of children: Not on file   Years of education: Not on file   Highest education level: Not on file  Occupational History   Not on file  Tobacco Use   Smoking status: Former    Types: Cigarettes   Smokeless tobacco: Never   Tobacco comments:    Quit smoking 5 years ago  Vaping Use   Vaping Use: Never used  Substance and Sexual Activity   Alcohol use: Not Currently    Comment: rare   Drug use: No   Sexual activity: Yes  Other Topics Concern   Not on file  Social History Narrative   Works at [employer]   Lives in [place] with girlfriend    Social Determinants of Radio broadcast assistant Strain: Not on file  Food Insecurity: Not on Pensions consultant  Needs: Not on file  Physical Activity: Not on file  Stress: Not on file  Social Connections: Not on file     Observations/Objective: Awake, alert and oriented x 3   Review of Systems  Constitutional:  Negative for fever, malaise/fatigue and weight loss.  HENT: Negative.  Negative for nosebleeds.   Eyes: Negative.  Negative for blurred vision, double vision and photophobia.  Respiratory: Negative.  Negative for cough and shortness of breath.   Cardiovascular: Negative.  Negative for chest pain, palpitations and leg swelling.  Gastrointestinal: Negative.  Negative for heartburn, nausea and vomiting.  Musculoskeletal: Negative.  Negative for myalgias.  Neurological: Negative.   Negative for dizziness, focal weakness, seizures and headaches.  Psychiatric/Behavioral: Negative.  Negative for suicidal ideas.    Assessment and Plan: Diagnoses and all orders for this visit:  Type 2 diabetes mellitus with hyperglycemia, without long-term current use of insulin ([place]) -     Discontinue: metFORMIN (GLUMETZA) 1000 MG (MOD) 24 hr tablet; Take 1 tablet (1,000 mg total) by mouth daily with breakfast. -     CMP14+EGFR; Future -     CMP14+EGFR -     Hemoglobin A1c; Future Continue blood sugar control as discussed in office today, low carbohydrate diet, and regular physical exercise as tolerated, 150 minutes per week (30 min each day, 5 days per week, or 50 min 3 days per week). Keep blood sugar logs with fasting goal of 90-130 mg/dl, post prandial (after you eat) less than 180.  For Hypoglycemia: BS <60 and Hyperglycemia BS >400; contact the clinic ASAP. Annual eye exams and foot exams are recommended.   Dyslipidemia, goal LDL below 70 -     Lipid panel; Future -     Lipid panel INSTRUCTIONS: Work on a low fat, heart healthy diet and participate in regular aerobic exercise program by working out at least 150 minutes per week; 5 days a week-30 minutes per day. Avoid red meat/beef/steak,  fried foods. junk foods, sodas, sugary drinks, unhealthy snacking, alcohol and smoking.  Drink at least 80 oz of water per day and monitor your carbohydrate intake daily.    Essential hypertension Continue all antihypertensives as prescribed.  Remember to bring in your blood pressure log with you for your follow up appointment.  DASH/Mediterranean Diets are healthier choices for HTN.    Leukocytosis, unspecified type -     CBC with Differential; Future -     CBC with Differential  Colon cancer screening -     Fecal occult blood, imunochemical([facility]); Future  Serum calcium elevated -     PTH, Intact and Calcium; Future -     PTH, Intact and Calcium  Need for hepatitis C  screening test -     HCV Ab w Reflex to Quant PCR; Future -     HCV Ab w Reflex to Quant PCR    Follow Up Instructions Return in about 3 months (around 01/18/2021).     I discussed the assessment and treatment plan with the patient. The patient was provided an opportunity to ask questions and all were answered. The patient agreed with the plan and demonstrated an understanding of the instructions.   The patient was advised to call back or seek an in-person evaluation if the symptoms worsen or if the condition fails to improve as anticipated.  I provided 14 minutes of non-face-to-face time during this encounter including median intraservice time, reviewing previous notes, labs, imaging, medications and explaining diagnosis and management.  Vernia Buff  Raul Del, FNP-BC

## 2020-10-20 ENCOUNTER — Other Ambulatory Visit: Payer: Self-pay | Admitting: Nurse Practitioner

## 2020-10-20 ENCOUNTER — Other Ambulatory Visit: Payer: Self-pay

## 2020-10-20 ENCOUNTER — Encounter: Payer: Self-pay | Admitting: Nurse Practitioner

## 2020-10-20 DIAGNOSIS — E1165 Type 2 diabetes mellitus with hyperglycemia: Secondary | ICD-10-CM

## 2020-10-20 MED ORDER — GLIMEPIRIDE 2 MG PO TABS
ORAL_TABLET | ORAL | 1 refills | Status: DC
  Filled 2020-10-20: qty 90, fill #0
  Filled 2020-11-07: qty 90, 90d supply, fill #0

## 2020-10-20 MED FILL — Losartan Potassium Tab 50 MG: ORAL | 30 days supply | Qty: 30 | Fill #1 | Status: AC

## 2020-10-20 MED FILL — Rivaroxaban Tab 20 MG: ORAL | 30 days supply | Qty: 30 | Fill #3 | Status: AC

## 2020-10-21 LAB — CBC WITH DIFFERENTIAL/PLATELET
Basophils Absolute: 0.1 10*3/uL (ref 0.0–0.2)
Basos: 1 %
EOS (ABSOLUTE): 0.1 10*3/uL (ref 0.0–0.4)
Eos: 1 %
Hematocrit: 46 % (ref 37.5–51.0)
Hemoglobin: 15.3 g/dL (ref 13.0–17.7)
Immature Grans (Abs): 0 10*3/uL (ref 0.0–0.1)
Immature Granulocytes: 0 %
Lymphocytes Absolute: 2.5 10*3/uL (ref 0.7–3.1)
Lymphs: 28 %
MCH: 31.4 pg (ref 26.6–33.0)
MCHC: 33.3 g/dL (ref 31.5–35.7)
MCV: 94 fL (ref 79–97)
Monocytes Absolute: 0.6 10*3/uL (ref 0.1–0.9)
Monocytes: 6 %
Neutrophils Absolute: 5.8 10*3/uL (ref 1.4–7.0)
Neutrophils: 64 %
Platelets: 222 10*3/uL (ref 150–450)
RBC: 4.88 x10E6/uL (ref 4.14–5.80)
RDW: 12.9 % (ref 11.6–15.4)
WBC: 9.1 10*3/uL (ref 3.4–10.8)

## 2020-10-21 LAB — CMP14+EGFR
ALT: 21 IU/L (ref 0–44)
AST: 15 IU/L (ref 0–40)
Albumin/Globulin Ratio: 1.9 (ref 1.2–2.2)
Albumin: 4.5 g/dL (ref 3.8–4.9)
Alkaline Phosphatase: 120 IU/L (ref 44–121)
BUN/Creatinine Ratio: 16 (ref 9–20)
BUN: 14 mg/dL (ref 6–24)
Bilirubin Total: 0.5 mg/dL (ref 0.0–1.2)
CO2: 21 mmol/L (ref 20–29)
Calcium: 10.6 mg/dL — ABNORMAL HIGH (ref 8.7–10.2)
Chloride: 100 mmol/L (ref 96–106)
Creatinine, Ser: 0.86 mg/dL (ref 0.76–1.27)
Globulin, Total: 2.4 g/dL (ref 1.5–4.5)
Glucose: 434 mg/dL — ABNORMAL HIGH (ref 65–99)
Potassium: 4.5 mmol/L (ref 3.5–5.2)
Sodium: 140 mmol/L (ref 134–144)
Total Protein: 6.9 g/dL (ref 6.0–8.5)
eGFR: 102 mL/min/{1.73_m2} (ref >59)

## 2020-10-21 LAB — PTH, INTACT AND CALCIUM: PTH: 21 pg/mL (ref 15–65)

## 2020-10-21 LAB — LIPID PANEL
Chol/HDL Ratio: 4 ratio (ref 0.0–5.0)
Cholesterol, Total: 162 mg/dL (ref 100–199)
HDL: 41 mg/dL (ref >39)
LDL Chol Calc (NIH): 70 mg/dL (ref 0–99)
Triglycerides: 318 mg/dL — ABNORMAL HIGH (ref 0–149)
VLDL Cholesterol Cal: 51 mg/dL — ABNORMAL HIGH (ref 5–40)

## 2020-10-21 LAB — HCV AB W REFLEX TO QUANT PCR: HCV Ab: <0.1 s/co ratio (ref 0.0–0.9)

## 2020-10-21 LAB — HCV INTERPRETATION

## 2020-10-24 ENCOUNTER — Other Ambulatory Visit: Payer: Self-pay | Admitting: Nurse Practitioner

## 2020-10-24 DIAGNOSIS — E1165 Type 2 diabetes mellitus with hyperglycemia: Secondary | ICD-10-CM

## 2020-10-24 LAB — HEMOGLOBIN A1C
Est. average glucose Bld gHb Est-mCnc: >398 mg/dL
Hgb A1c MFr Bld: >15.5 % — ABNORMAL HIGH (ref 4.8–5.6)

## 2020-10-24 LAB — SPECIMEN STATUS REPORT

## 2020-10-24 MED ORDER — TRULICITY 0.75 MG/0.5ML ~~LOC~~ SOAJ
0.7500 mg | SUBCUTANEOUS | 6 refills | Status: DC
  Filled 2020-10-24: qty 2, 28d supply, fill #0

## 2020-10-24 MED ORDER — LANTUS SOLOSTAR 100 UNIT/ML ~~LOC~~ SOPN
20.0000 [IU] | PEN_INJECTOR | Freq: Every day | SUBCUTANEOUS | 99 refills | Status: DC
  Filled 2020-10-24 – 2020-10-31 (×2): qty 15, 75d supply, fill #0
  Filled 2020-11-07: qty 18, 90d supply, fill #0
  Filled 2021-01-15: qty 6, 30d supply, fill #1
  Filled 2021-02-20: qty 6, 30d supply, fill #0
  Filled 2021-02-20: qty 6, 30d supply, fill #2

## 2020-10-24 MED ORDER — INSULIN PEN NEEDLE 31G X 5 MM MISC
5 refills | Status: DC
  Filled 2020-10-24: qty 100, 25d supply, fill #0
  Filled 2020-12-20: qty 100, 25d supply, fill #1
  Filled 2021-03-08: qty 100, 25d supply, fill #0
  Filled 2021-03-08: qty 100, 25d supply, fill #2

## 2020-10-25 ENCOUNTER — Other Ambulatory Visit: Payer: Self-pay

## 2020-10-25 NOTE — Progress Notes (Signed)
Called pt and informed his of his A1C results Pt repeated what I told him back and stated he understood the instructions.

## 2020-10-31 ENCOUNTER — Other Ambulatory Visit: Payer: Self-pay

## 2020-11-07 ENCOUNTER — Other Ambulatory Visit: Payer: Self-pay

## 2020-11-16 ENCOUNTER — Other Ambulatory Visit: Payer: Self-pay

## 2020-11-16 ENCOUNTER — Ambulatory Visit: Payer: 59 | Attending: Family Medicine | Admitting: Pharmacist

## 2020-11-16 DIAGNOSIS — E1165 Type 2 diabetes mellitus with hyperglycemia: Secondary | ICD-10-CM

## 2020-11-16 LAB — GLUCOSE, POCT (MANUAL RESULT ENTRY): POC Glucose: 278 mg/dl — AB (ref 70–99)

## 2020-11-16 MED ORDER — METFORMIN HCL ER 500 MG PO TB24
1000.0000 mg | ORAL_TABLET | Freq: Two times a day (BID) | ORAL | 2 refills | Status: DC
  Filled 2020-11-16 – 2020-12-30 (×3): qty 120, 30d supply, fill #0
  Filled 2021-01-05: qty 360, 90d supply, fill #0

## 2020-11-16 MED ORDER — TRUE METRIX METER W/DEVICE KIT
PACK | 0 refills | Status: DC
  Filled 2020-11-16: qty 1, 30d supply, fill #0

## 2020-11-16 MED ORDER — TRUEPLUS LANCETS 28G MISC
1 refills | Status: AC
  Filled 2020-11-16: qty 100, 50d supply, fill #0

## 2020-11-16 MED ORDER — GLUCOSE BLOOD VI STRP
ORAL_STRIP | 12 refills | Status: AC
  Filled 2020-11-16 – 2021-02-28 (×2): qty 100, 50d supply, fill #0

## 2020-11-16 MED ORDER — TRULICITY 1.5 MG/0.5ML ~~LOC~~ SOAJ
1.5000 mg | SUBCUTANEOUS | 2 refills | Status: DC
  Filled 2020-11-16: qty 2, 28d supply, fill #0
  Filled 2020-12-11 – 2020-12-20 (×2): qty 2, 28d supply, fill #1
  Filled 2021-01-15: qty 2, 28d supply, fill #2

## 2020-11-16 NOTE — Progress Notes (Signed)
S:    PCP: [name]  Patient arrives in good spirits.  Presents for diabetes evaluation, education, and management . Patient was referred and last seen by [name] via televisit on 10/19/2020 for chronic disease state management. Labs were collected after that visit and resulted in an A1c > 15.5%.   Today, patient reports medication adherence with Lantus 18 units daily, glimepiride 2 mg daily, Trulicity 8.54 mg weekly and metformin 500 mg XR qAM and 1000 mg XR qPM. Denies diarrhea, nausea/vomiting, injection site reactions or abdominal pain due to history of acute pancreatitis. Patient reported checking blood sugars at home daily with a range of 190-330s and one outlier blood sugar level of 129. Denies any blood sugars < 70.   Family/Social History: - Family Hx: Diabetes (mother and father) - Social Hx: Former smoker (quit 5 years ago), denies tobacco or smokeless tobacco use   Insurance coverage/medication affordability: dnbi  Medication adherence reported.   Current diabetes medications include: Lantus 18 units daily (sometimes uses in the morning or evening), glimepiride 2 mg daily, metformin 500 mg XR qAM and 6270 mg qPM, Trulicity 3.50 mg weekly Current hypertension medications include: losartan 50 mg daily Current hyperlipidemia medications include: atorvastatin 80 mg daily  Patient denies hypoglycemic events.  Patient reported dietary habits: Eats 2-3 meals/day Breakfast: cereal or 2 eggs, toast, bacon Lunch: sometimes nothing or banana Dinner: chicken, salads, vegetables, potatoes (states if he has potatoes then he has no bread) Drinks: water, sugar-free juice packets, sugar-free sodas   Patient-reported exercise habits: walks around neighborhood with neighbor   Patient denies nocturia (nighttime urination).  Patient denies neuropathy (nerve pain). Patient denies visual changes. Patient reports self foot exams.     O:  POCT: 278 (reports it was 269 after eating  breakfast this morning)   Lab Results  Component Value Date   HGBA1C >15.5 (H) 10/20/2020   Lipid Panel     Component Value Date/Time   CHOL 162 10/20/2020 1011   TRIG 318 (H) 10/20/2020 1011   HDL 41 10/20/2020 1011   CHOLHDL 4.0 10/20/2020 1011   CHOLHDL 4.4 08/03/2019 2005   VLDL UNABLE TO CALCULATE IF TRIGLYCERIDE OVER 400 mg/dL 08/03/2019 2005   [place] 70 10/20/2020 1011   LDLDIRECT 57.0 08/03/2019 2005   Clinical Atherosclerotic Cardiovascular Disease (ASCVD):  The 10-year ASCVD risk score (Arnett DK, et al., 2019) is: 34.5%   Values used to calculate the score:     Age: 84 years     Sex: Male     Is Non-Hispanic African American: Yes     Diabetic: Yes     Tobacco smoker: Yes     Systolic Blood Pressure: 093 mmHg     Is BP treated: Yes     HDL Cholesterol: 41 mg/dL     Total Cholesterol: 162 mg/dL    A/P: Goal: A1c < 7%, FBG 80-130 mg/dL, and 2H-PPBG < 180 mg/dL.  Diabetes longstanding currently uncontrolled (A1c > 15.5% up from 11%). Patient is able to verbalize appropriate hypoglycemia management plan. Medication adherence appears optimal.  - Discontinued glimepiride 2 mg daily - Increased Lantus from 18 units to 20 units daily - Increased Metformin from 500 mg qAM/1000 mg qPM to 1000 mg XR BID - Increased Trulicity from 8.18 mg to 1.5 mg weekly - Extensively discussed pathophysiology of diabetes, recommended lifestyle interventions, dietary effects on blood sugar control - Counseled on s/sx of and management of hypoglycemia - Counseled on medication related side effects  such as diarrhea, nausea, vomiting, injection site reaction, abdominal pain due to history of pancreatitis - Next A1C anticipated December 2022.   Follow up in 4 weeks with Lurena Joiner. Plan to bring glucometer to the visit.   Written patient instructions provided.  Total time in face to face counseling 30 minutes.    Patient seen by:  Darcel Smalling, Student Pharmacist [place] [employer] [place], PharmD, [place], [place] [phone]

## 2020-11-17 ENCOUNTER — Other Ambulatory Visit: Payer: Self-pay

## 2020-11-22 ENCOUNTER — Other Ambulatory Visit: Payer: Self-pay

## 2020-11-22 MED FILL — Losartan Potassium Tab 50 MG: ORAL | 30 days supply | Qty: 30 | Fill #2 | Status: AC

## 2020-11-22 MED FILL — Rivaroxaban Tab 20 MG: ORAL | 30 days supply | Qty: 30 | Fill #4 | Status: AC

## 2020-11-28 ENCOUNTER — Other Ambulatory Visit: Payer: Self-pay

## 2020-12-12 ENCOUNTER — Other Ambulatory Visit: Payer: Self-pay

## 2020-12-13 NOTE — Progress Notes (Unsigned)
S:    PCP: Zelda  Patient arrives in good spirits.  Presents for diabetes evaluation, education, and management . Patient was referred and last seen by Zelda via televisit on 10/19/2020 for chronic disease state management. Labs were collected after that visit and resulted in an A1c > 15.5%. At last visit with [facility], increased Trulicity, metformin, and Lantus, and discontinued glimepiride.   Today, *** -made correct med changes per last visit?  -continue to titrate trulicity -sees zelda 45/62, see CPP one more time  Family/Social History: - Family Hx: Diabetes (mother and father) - Social Hx: Former smoker (quit 5 years ago), denies tobacco or smokeless tobacco use   Insurance coverage/medication affordability: dnbi  Medication adherence reported.   Current diabetes medications include: Lantus 20 units daily (sometimes uses in the morning or evening), metformin 5638 mg XR BID, Trulicity 1.5 mg weekly Current hypertension medications include: losartan 50 mg daily Current hyperlipidemia medications include: atorvastatin 80 mg daily  Patient denies hypoglycemic events.  Patient reported dietary habits: Eats 2-3 meals/day Breakfast: cereal or 2 eggs, toast, bacon Lunch: sometimes nothing or banana Dinner: chicken, salads, vegetables, potatoes (states if he has potatoes then he has no bread) Drinks: water, sugar-free juice packets, sugar-free sodas   Patient-reported exercise habits: walks around neighborhood with neighbor   Patient denies nocturia (nighttime urination).  Patient denies neuropathy (nerve pain). Patient denies visual changes. Patient reports self foot exams.     O:  POCT: *** 278 (reports it was 269 after eating breakfast this morning)   Lab Results  Component Value Date   HGBA1C >15.5 (H) 10/20/2020   Lipid Panel     Component Value Date/Time   CHOL 162 10/20/2020 1011   TRIG 318 (H) 10/20/2020 1011   HDL 41 10/20/2020 1011   CHOLHDL 4.0 10/20/2020  1011   CHOLHDL 4.4 08/03/2019 2005   VLDL UNABLE TO CALCULATE IF TRIGLYCERIDE OVER 400 mg/dL 08/03/2019 2005   [place] 70 10/20/2020 1011   LDLDIRECT 57.0 08/03/2019 2005   Clinical Atherosclerotic Cardiovascular Disease (ASCVD):  The 10-year ASCVD risk score (Arnett DK, et al., 2019) is: 34.5%   Values used to calculate the score:     Age: 55 years     Sex: Male     Is Non-Hispanic African American: Yes     Diabetic: Yes     Tobacco smoker: Yes     Systolic Blood Pressure: 937 mmHg     Is BP treated: Yes     HDL Cholesterol: 41 mg/dL     Total Cholesterol: 162 mg/dL    A/P: Goal: A1c < 7%, FBG 80-130 mg/dL, and 2H-PPBG < 180 mg/dL.  Diabetes longstanding currently uncontrolled (A1c > 15.5% up from 11%). Patient is able to verbalize appropriate hypoglycemia management plan. Medication adherence appears optimal.  - Discontinued glimepiride 2 mg daily - Increased Lantus from 18 units to 20 units daily - Increased Metformin from 500 mg qAM/1000 mg qPM to 1000 mg XR BID - Increased Trulicity from 3.42 mg to 1.5 mg weekly - Extensively discussed pathophysiology of diabetes, recommended lifestyle interventions, dietary effects on blood sugar control - Counseled on s/sx of and management of hypoglycemia - Counseled on medication related side effects such as diarrhea, nausea, vomiting, injection site reaction, abdominal pain due to history of pancreatitis - Next A1C anticipated December 2022.   Follow up in 4 weeks with Lurena Joiner. Plan to bring glucometer to the visit.   Total time in face to face counseling  30 minutes.

## 2020-12-14 ENCOUNTER — Ambulatory Visit: Payer: 59 | Admitting: Pharmacist

## 2020-12-14 ENCOUNTER — Ambulatory Visit: Payer: 59 | Admitting: Physician Assistant

## 2020-12-19 ENCOUNTER — Other Ambulatory Visit: Payer: Self-pay

## 2020-12-19 NOTE — Progress Notes (Unsigned)
S:     No chief complaint on file.   Patient arrives ***.  Presents for diabetes evaluation, education, and management Patient was referred and last seen by Primary Care Provider [name] on 10/19/2020. Patient was last seen by CPP on 11/16/2020 where glimepiride was stopped, lantus was increased, metformin was increased, and Trulicity was increased.  Patient reports Diabetes was diagnosed in ***.   Family/Social History:  - Family Hx: Diabetes (mother and father) - Social Hx: Former smoker (quit 5 years ago), denies tobacco or smokeless tobacco use  Insurance coverage/medication affordability: ***  Medication adherence reported *** .   Current diabetes medications include:  -Trulicity 1.5 mg weekly -Metformin 1000 mg XR QD -Lantus 20 units QD  Current hypertension medications include:  -Losartan 50 mg QD  Current hyperlipidemia medications include:  -Atorvastatin 40 mg QD  Patient {Actions; denies-reports:120008} hypoglycemic events.  Patient reported dietary habits: Eats *** meals/day Breakfast:*** Lunch:*** Dinner:*** Snacks:*** Drinks:***  Patient-reported exercise habits: ***   Patient {Actions; denies-reports:120008} nocturia (nighttime urination).  Patient {Actions; denies-reports:120008} neuropathy (nerve pain). Patient {Actions; denies-reports:120008} visual changes. Patient {Actions; denies-reports:120008} self foot exams.     O:  Physical Exam  ROS  Lab Results  Component Value Date   HGBA1C >15.5 (H) 10/20/2020   There were no vitals filed for this visit.  Lipid Panel     Component Value Date/Time   CHOL 162 10/20/2020 1011   TRIG 318 (H) 10/20/2020 1011   HDL 41 10/20/2020 1011   CHOLHDL 4.0 10/20/2020 1011   CHOLHDL 4.4 08/03/2019 2005   VLDL UNABLE TO CALCULATE IF TRIGLYCERIDE OVER 400 mg/dL 08/03/2019 2005   LDLCALC 70 10/20/2020 1011   LDLDIRECT 57.0 08/03/2019 2005    Home fasting blood sugars: ***  2 hour  post-meal/random blood sugars: ***.  Clinical Atherosclerotic Cardiovascular Disease (ASCVD): {YES/NO:21197} The 10-year ASCVD risk score (Arnett DK, et al., 2019) is: 34.5%   Values used to calculate the score:     Age: 82 years     Sex: Male     Is Non-Hispanic African American: Yes     Diabetic: Yes     Tobacco smoker: Yes     Systolic Blood Pressure: 008 mmHg     Is BP treated: Yes     HDL Cholesterol: 41 mg/dL     Total Cholesterol: 162 mg/dL   A/P: Diabetes longstanding*** currently ***. Patient is *** able to verbalize appropriate hypoglycemia management plan. Medication adherence appears ***. Control is suboptimal due to ***. -{Meds adjust:18428} basal insulin *** (insulin ***). Patient will continue to titrate 1 unit every *** days if fasting blood sugar > 100mg /dl until fasting blood sugars reach goal or next visit.  -{Meds adjust:18428}  rapid insulin *** (insulin ***) to ***.  -{Meds adjust:18428} GLP-1 *** (generic name***) to ***.  -{Meds adjust:18428} SGLT2-I *** (generic name***) to ***. Counseled on sick day rules for ***. -Extensively discussed pathophysiology of diabetes, recommended lifestyle interventions, dietary effects on blood sugar control -Counseled on s/sx of and management of hypoglycemia -Next A1C anticipated ***.   ASCVD risk - primary***secondary prevention in patient with diabetes. Last LDL {Is/is not:9024} controlled. ASCVD risk score {Is/is not:9024} >20%  - {Desc; low/moderate/high:110033} intensity statin indicated. Aspirin {Is/is not:9024} indicated.  -{Meds adjust:18428} aspirin *** mg  -{Meds adjust:18428} ***statin *** mg.   Hypertension longstanding*** currently ***.  Blood pressure goal = *** mmHg. Medication adherence ***.  Blood pressure control is suboptimal due to ***. -***  Written patient instructions provided.  Total time in face to face counseling *** minutes.   Follow up Pharmacist/PCP*** Clinic Visit in ***.   Patient seen with  ***

## 2020-12-20 ENCOUNTER — Other Ambulatory Visit: Payer: Self-pay

## 2020-12-20 ENCOUNTER — Other Ambulatory Visit: Payer: Self-pay | Admitting: Nurse Practitioner

## 2020-12-20 ENCOUNTER — Ambulatory Visit: Payer: 59 | Admitting: Pharmacist

## 2020-12-20 ENCOUNTER — Other Ambulatory Visit: Payer: Self-pay | Admitting: Physician Assistant

## 2020-12-20 DIAGNOSIS — M1A9XX0 Chronic gout, unspecified, without tophus (tophi): Secondary | ICD-10-CM

## 2020-12-20 DIAGNOSIS — K55069 Acute infarction of intestine, part and extent unspecified: Secondary | ICD-10-CM

## 2020-12-20 MED ORDER — RIVAROXABAN 20 MG PO TABS
ORAL_TABLET | Freq: Every day | ORAL | 4 refills | Status: DC
  Filled 2020-12-20 – 2020-12-30 (×2): qty 30, 30d supply, fill #0
  Filled 2021-01-31: qty 30, 30d supply, fill #1
  Filled 2021-02-28: qty 30, 30d supply, fill #0
  Filled 2021-03-29 – 2021-04-10 (×2): qty 30, 30d supply, fill #1
  Filled 2021-05-03: qty 30, 30d supply, fill #2

## 2020-12-20 NOTE — Telephone Encounter (Signed)
Requested Prescriptions  Pending Prescriptions Disp Refills  . rivaroxaban (XARELTO) 20 MG TABS tablet 30 tablet 4    Sig: TAKE 1 TABLET (20 MG TOTAL) BY MOUTH DAILY WITH SUPPER.     Hematology: Anticoagulants - rivaroxaban Passed - 12/20/2020  9:14 AM      Passed - ALT in normal range and within 180 days    ALT  Date Value Ref Range Status  10/20/2020 21 0 - 44 IU/L Final         Passed - AST in normal range and within 180 days    AST  Date Value Ref Range Status  10/20/2020 15 0 - 40 IU/L Final         Passed - Cr in normal range and within 360 days    Creatinine  Date Value Ref Range Status  05/10/2014 0.76 mg/dL Final    Comment:    0.61-1.24 NOTE: New Reference Range  04/13/14    Creatinine, Ser  Date Value Ref Range Status  10/20/2020 0.86 0.76 - 1.27 mg/dL Final         Passed - HCT in normal range and within 360 days    Hematocrit  Date Value Ref Range Status  10/20/2020 46.0 37.5 - 51.0 % Final         Passed - HGB in normal range and within 360 days    Hemoglobin  Date Value Ref Range Status  10/20/2020 15.3 13.0 - 17.7 g/dL Final         Passed - PLT in normal range and within 360 days    Platelets  Date Value Ref Range Status  10/20/2020 222 150 - 450 x10E3/uL Final         Passed - Valid encounter within last 12 months    Recent Outpatient Visits          1 month ago Type 2 diabetes mellitus with hyperglycemia, without long-term current use of insulin ([place])   [place], Annie Main L, RPH-CPP   2 months ago Type 2 diabetes mellitus with hyperglycemia, without long-term current use of insulin ([place])   [place], [place] W, NP   7 months ago Type 2 diabetes mellitus with hyperglycemia, without long-term current use of insulin [facility])   [place], [place] W, NP   9 months ago Type 2 diabetes mellitus with hyperglycemia, without  long-term current use of insulin [facility])   [place], Annie Main L, RPH-CPP   9 months ago Type 2 diabetes mellitus with hyperglycemia, without long-term current use of insulin [facility])   [place], Dionne [place], [place]      Future Appointments            In 2 weeks Gasper Sells, Terisa Starr, MD [place], [place]   In 1 month Gildardo Pounds, NP [place]

## 2020-12-20 NOTE — Telephone Encounter (Signed)
Requested medication (s) are due for refill today: no  Requested medication (s) are on the active medication list: yes   Last refill: 09/16/19  #90 1 refill  Future visit scheduled yes 01/20/21  Notes to clinic: Noted discontinued 10/19/20 by [name]. Med remains on current med profile. PLease review.  Requested Prescriptions  Pending Prescriptions Disp Refills   allopurinol (ZYLOPRIM) 100 MG tablet 90 tablet 1    Sig: TAKE 1 TABLET (100 MG TOTAL) BY MOUTH DAILY.     Endocrinology:  Gout Agents Failed - 12/20/2020  9:14 AM      Failed - Uric Acid in normal range and within 360 days    No results found for: POCURA, LABURIC        Passed - Cr in normal range and within 360 days    Creatinine  Date Value Ref Range Status  05/10/2014 0.76 mg/dL Final    Comment:    0.61-1.24 NOTE: New Reference Range  04/13/14    Creatinine, Ser  Date Value Ref Range Status  10/20/2020 0.86 0.76 - 1.27 mg/dL Final          Passed - Valid encounter within last 12 months    Recent Outpatient Visits           1 month ago Type 2 diabetes mellitus with hyperglycemia, without long-term current use of insulin [facility])   [place], Annie Main L, RPH-CPP   2 months ago Type 2 diabetes mellitus with hyperglycemia, without long-term current use of insulin [facility])   [place], [place] W, NP   7 months ago Type 2 diabetes mellitus with hyperglycemia, without long-term current use of insulin [facility])   [place], [place] W, NP   9 months ago Type 2 diabetes mellitus with hyperglycemia, without long-term current use of insulin [facility])   [place], Annie Main L, RPH-CPP   9 months ago Type 2 diabetes mellitus with hyperglycemia, without long-term current use of insulin [facility])   [place], Dionne [place], [place]        Future Appointments             In 2 weeks Gasper Sells, Terisa Starr, MD [place], [place]   In 1 month Gildardo Pounds, NP [place]

## 2020-12-22 ENCOUNTER — Other Ambulatory Visit: Payer: Self-pay

## 2020-12-22 MED ORDER — ALLOPURINOL 100 MG PO TABS
ORAL_TABLET | Freq: Every day | ORAL | 1 refills | Status: DC
  Filled 2020-12-22 – 2021-01-20 (×2): qty 30, 30d supply, fill #0

## 2020-12-27 ENCOUNTER — Other Ambulatory Visit: Payer: Self-pay

## 2020-12-30 MED FILL — Losartan Potassium Tab 50 MG: ORAL | 30 days supply | Qty: 30 | Fill #3 | Status: AC

## 2021-01-02 ENCOUNTER — Other Ambulatory Visit: Payer: Self-pay

## 2021-01-03 ENCOUNTER — Other Ambulatory Visit: Payer: Self-pay

## 2021-01-05 ENCOUNTER — Other Ambulatory Visit: Payer: Self-pay

## 2021-01-08 NOTE — Progress Notes (Signed)
Cardiology Office Note:    Date:  01/09/2021   ID:  Riley Wiggins, DOB 1965/05/20, MRN [id number]  PCP:  Gildardo Pounds, NP   [place]  Cardiologist:  Werner Lean, MD  Advanced Practice Provider:  No care team member to display Electrophysiologist:  None      CC: Follow up blockage  History of Present Illness:    Riley Wiggins is a 55 y.o. 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  In interim of this visit, patient did not get the echo we had discussed or the heart monitor.  Seen 01/09/21.  Patient notes that he is doing good.  No further blood clots.  No further abdominal pain.   Notes that the heart monitor when to the wrong house; no one called him about the echo. Had a bleeding issues 08/07/20 but is otherwise well.  No chest pain or pressure .  No SOB/DOE and no PND/Orthopnea.  No weight gain or leg swelling.  No palpitations or syncope.  Ambulatory blood pressure not done.  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     Past Surgical History:  Procedure Laterality Date   HERNIA REPAIR     " when I was young "   NO PAST SURGERIES      Current Medications: Current Meds  Medication Sig   acetaminophen (TYLENOL) 500 MG tablet Take 1,000 mg by mouth every 6 (six) hours as needed for mild pain.   allopurinol (ZYLOPRIM) 100 MG tablet TAKE 1 TABLET (100 MG TOTAL) BY MOUTH DAILY.   Ascorbic Acid (VITAMIN C) 1000 MG tablet Take 1,000 mg by mouth daily.   atorvastatin (LIPITOR) 40 MG tablet Take 1 tablet (40 mg total) by mouth daily.   Blood Glucose Monitoring Suppl (TRUE METRIX METER) w/Device KIT Use to check blood sugar  TID.   Dulaglutide (TRULICITY) 1.5 XL/2.4MW SOPN Inject 1.5 mg into the skin once a week.   Garlic 1027 MG CAPS Take 1,200 mg by mouth daily.   glucose blood test strip USE AS DIRECTED TO CHECK BLOOD GLUCOSE TWICE DAILY   insulin glargine (LANTUS SOLOSTAR) 100 UNIT/ML Solostar Pen Inject 20 Units into the skin daily.   Insulin Pen Needle 31G X 5 MM MISC Use as instructed. Inject into the skin once daily.   losartan (COZAAR) 50 MG tablet TAKE 1 TABLET (50 MG TOTAL) BY MOUTH DAILY.   metFORMIN (GLUCOPHAGE XR) 500 MG 24 hr tablet Take 2 tablets (1,000 mg total) by mouth in the morning and at bedtime.   Multiple Vitamins-Minerals (MULTI FOR HIM 50+) TABS Take 1 tablet by mouth daily.   rivaroxaban (XARELTO) 20 MG TABS tablet TAKE 1 TABLET (20 MG TOTAL) BY MOUTH DAILY WITH SUPPER.   TRUEplus Lancets 28G MISC CHECK BLOOD GLUCOSE LEVEL BY FINGERSTICK TWICE PER DAY. E11.65.   [DISCONTINUED] warfarin (COUMADIN) 5 MG tablet TAKE ONE TABLET BY MOUTH DAILY WITH XARELTO FOR THE LAST THREE DAYS OF YOUR XARELTO PRESCRIPTION. AFTER THOSE 3 DAYS ARE OVER, TAKE ONE TA<MORE>     Allergies:   Patient has no known allergies.   Social History   Socioeconomic History  Marital status: Single    Spouse name: Not on file   Number of children: Not on file   Years of education: Not on file   Highest education level: Not on file  Occupational History   Not on file  Tobacco Use   Smoking status: Former    Types: Cigarettes   Smokeless tobacco: Never   Tobacco comments:    Quit smoking 5 years ago  Vaping Use   Vaping Use: Never used  Substance and Sexual Activity   Alcohol use: Not Currently    Comment: rare   Drug use: No   Sexual activity: Yes  Other Topics Concern   Not on file  Social History Narrative   Works at [employer]   Lives in [place] with girlfriend    Social Determinants of Radio broadcast assistant Strain: Not on file  Food Insecurity: Not on file  Transportation Needs: Not  on file  Physical Activity: Not on file  Stress: Not on file  Social Connections: Not on file     Family History: The patient's family history includes Diabetes in his father and mother. There is no history of Pancreatitis, Colon cancer, or Stomach cancer.  ROS:   Please see the history of present illness.     All other systems reviewed and are negative.  EKGs/Labs/Other Studies Reviewed:    The following studies were reviewed today:  EKG:  04/14/20:  SR 82 Baseline Artifact Noted  Cardiac CT: Date:04/19/2007 Results: Findings:  Ascending aorta above sinotubular junction is mildly dilated at 3.5 cm in  diameter.  The descending aorta at the same level is 2.2 cm in diameter.   The left atrium has normal morphology. The aortic valve is tricuspid.   Normal right dominant coronary anatomy.  No coronary calcification.   Left Main: No stenosis.  Left Anterior Descending: No stenosis.  Left Circumflex: No stenosis.  Right Coronary Artery: No stenosis.   Impression:  1) No significant coronary artery disease.  2) No coronary calcification.  3)         See additional report for lung fields.      Recent Labs: 10/20/2020: ALT 21; BUN 14; Creatinine, Ser 0.86; Hemoglobin 15.3; Platelets 222; Potassium 4.5; Sodium 140  Recent Lipid Panel    Component Value Date/Time   CHOL 162 10/20/2020 1011   TRIG 318 (H) 10/20/2020 1011   HDL 41 10/20/2020 1011   CHOLHDL 4.0 10/20/2020 1011   CHOLHDL 4.4 08/03/2019 2005   VLDL UNABLE TO CALCULATE IF TRIGLYCERIDE OVER 400 mg/dL 08/03/2019 2005   LDLCALC 70 10/20/2020 1011   LDLDIRECT 57.0 08/03/2019 2005     Risk Assessment/Calculations:     The 10-year ASCVD risk score (Arnett DK, et al., 2019) is: 43.2%   Values used to calculate the score:     Age: 71 years     Sex: Male     Is Non-Hispanic African American: Yes     Diabetic: Yes     Tobacco smoker: Yes     Systolic Blood Pressure: 962 mmHg     Is BP treated: Yes     HDL  Cholesterol: 41 mg/dL     Total Cholesterol: 162 mg/dL   Physical Exam:    VS:  BP (!) 157/100   Pulse 84   Ht $R'6\' 2"'DG$  (1.88 m)   Wt 299 lb (135.6 kg)   SpO2 97%   BMI 38.39 kg/m     Wt Readings from Last 3  Encounters:  01/09/21 299 lb (135.6 kg)  08/07/20 280 lb (127 kg)  05/05/20 275 lb 6 oz (124.9 kg)     Gen: No distress, morbid obesity   Neck: No JVD Cardiac: No Rubs or Gallops, no Murmur, rhythm  Respiratory: Clear to auscultation bilaterally, normal effort, normal respiratory rate GI: Soft, nontender, non-distended  MS: No  edema;  moves all extremities Integument: Skin feels warm Neuro:  At time of evaluation, alert and oriented to person/place/time/situation  Psych: Normal affect, patient feels well   ASSESSMENT:    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 Hypertension associated with diabetes ([place])   3. Aortic atherosclerosis ([facility])     PLAN:    In order of problems listed above:  Superior Mesenteric Thrombus - will get echo and 14 day non-live heart monitor for etiology of thrombus - now on Xarelto   Hypertension with Diabetes - ambulatory blood pressure not at goal - continue home medications: we discussed increasing losartan; patient notes he normal has normal BP - will start ambulatory BP monitoring  Aortic Atherosclerosis Hyperlipidemia (Trigylcerides) -LDL goal less than 70 to start -continue current statin; at next visit will re-engage with more aggressive therapy  Will plan for 3-4 month follow up unless new symptoms or abnormal test results warranting change in plan  Would be reasonable for me or APP Follow up    Medication Adjustments/Labs and Tests Ordered: Current medicines are reviewed at length with the patient today.  Concerns regarding medicines are outlined above.  Orders Placed This Encounter  Procedures   LONG TERM MONITOR (3-14 DAYS)   ECHOCARDIOGRAM COMPLETE    No orders of the defined types were placed in  this encounter.   Patient Instructions  Medication Instructions:  Your physician recommends that you continue on your current medications as directed. Please refer to the Current Medication list given to you today.  *If you need a refill on your cardiac medications before your next appointment, please call your pharmacy*   Lab Work: NONE If you have labs (blood work) drawn today and your tests are completely normal, you will receive your results only by: [place] (if you have MyChart) OR A paper copy in the mail If you have any lab test that is abnormal or we need to change your treatment, we will call you to review the results.   Testing/Procedures: Your physician has requested that you wear a heart monitor.   Follow-Up: At [facility], you and your health needs are our priority.  As part of our continuing mission to provide you with exceptional heart care, we have created designated Provider Care Teams.  These Care Teams include your primary Cardiologist (physician) and Advanced Practice Providers (APPs -  Physician Assistants and Nurse Practitioners) who all work together to provide you with the care you need, when you need it.  We recommend signing up for the patient portal called "MyChart".  Sign up information is provided on this After Visit Summary.  MyChart is used to connect with patients for Virtual Visits (Telemedicine).  Patients are able to view lab/test results, encounter notes, upcoming appointments, etc.  Non-urgent messages can be sent to your provider as well.   To learn more about what you can do with MyChart, go to [email or web address].    Your next appointment:   3-4  month(s)  The format for your next appointment:   In Person  Provider:      Other Instructions Please monitor your blood  pressure  ZIO XT- Long Term Monitor Instructions  Your physician has requested you wear a ZIO patch monitor for 14 days.  This is a single patch monitor.  [employer] supplies one patch monitor per enrollment. Additional stickers are not available. Please do not apply patch if you will be having a Nuclear Stress Test,  Echocardiogram, Cardiac CT, MRI, or Chest Xray during the period you would be wearing the  monitor. The patch cannot be worn during these tests. You cannot remove and re-apply the  ZIO XT patch monitor.  Your ZIO patch monitor will be mailed 3 day USPS to your address on file. It may take 3-5 days  to receive your monitor after you have been enrolled.  Once you have received your monitor, please review the enclosed instructions. Your monitor  has already been registered assigning a specific monitor serial # to you.  Billing and Patient Assistance Program Information  We have supplied [employer] with any of your insurance information on file for billing purposes. [employer] offers a sliding scale Patient Assistance Program for patients that do not have  insurance, or whose insurance does not completely cover the cost of the ZIO monitor.  You must apply for the Patient Assistance Program to qualify for this discounted rate.  To apply, please call [employer] at [phone], select option 4, select option 2, ask to apply for  Patient Assistance Program. Theodore Demark will ask your household income, and how many people  are in your household. They will quote your out-of-pocket cost based on that information.  [employer] will also be able to set up a 35-month interest-free payment plan if needed.  Applying the monitor   Shave hair from upper left chest.  Hold abrader disc by orange tab. Rub abrader in 40 strokes over the upper left chest as  indicated in your monitor instructions.  Clean area with 4 enclosed alcohol pads. Let dry.  Apply patch as indicated in monitor instructions. Patch will be placed under collarbone on left  side of chest with arrow pointing upward.  Rub patch adhesive wings for 2 minutes. Remove white label marked "1". Remove the  white  label marked "2". Rub patch adhesive wings for 2 additional minutes.  While looking in a mirror, press and release button in center of patch. A small green light will  flash 3-4 times. This will be your only indicator that the monitor has been turned on.  Do not shower for the first 24 hours. You may shower after the first 24 hours.  Press the button if you feel a symptom. You will hear a small click. Record Date, Time and  Symptom in the Patient Logbook.  When you are ready to remove the patch, follow instructions on the last 2 pages of Patient  Logbook. Stick patch monitor onto the last page of Patient Logbook.  Place Patient Logbook in the blue and white box. Use locking tab on box and tape box closed  securely. The blue and white box has prepaid postage on it. Please place it in the mailbox as  soon as possible. Your physician should have your test results approximately 7 days after the  [facility]  Call [facility] [phone] you have questions regarding  your ZIO XT patch monitor. Call them immediately if you see an orange light blinking on your  monitor.  If your monitor falls off in less than 4 days, contact our Monitor department at [phone]  If your monitor becomes loose or falls off after 4 days call [employer] at [phone] for  suggestions on securing your monitor     Signed, Werner Lean, MD  01/09/2021 5:13 PM    [place]

## 2021-01-09 ENCOUNTER — Ambulatory Visit: Payer: 59 | Admitting: Internal Medicine

## 2021-01-09 ENCOUNTER — Encounter: Payer: Self-pay | Admitting: Internal Medicine

## 2021-01-09 ENCOUNTER — Other Ambulatory Visit: Payer: Self-pay

## 2021-01-09 ENCOUNTER — Other Ambulatory Visit: Payer: Self-pay | Admitting: Internal Medicine

## 2021-01-09 ENCOUNTER — Ambulatory Visit (INDEPENDENT_AMBULATORY_CARE_PROVIDER_SITE_OTHER): Payer: Self-pay

## 2021-01-09 VITALS — BP 157/100 | HR 84 | Ht 74.0 in | Wt 299.0 lb

## 2021-01-09 DIAGNOSIS — I700 Atherosclerosis of aorta: Secondary | ICD-10-CM

## 2021-01-09 DIAGNOSIS — E1159 Type 2 diabetes mellitus with other circulatory complications: Secondary | ICD-10-CM

## 2021-01-09 DIAGNOSIS — K55069 Acute infarction of intestine, part and extent unspecified: Secondary | ICD-10-CM

## 2021-01-09 DIAGNOSIS — I4891 Unspecified atrial fibrillation: Secondary | ICD-10-CM

## 2021-01-09 DIAGNOSIS — I152 Hypertension secondary to endocrine disorders: Secondary | ICD-10-CM

## 2021-01-09 NOTE — Patient Instructions (Signed)
Medication Instructions:  Your physician recommends that you continue on your current medications as directed. Please refer to the Current Medication list given to you today.  *If you need a refill on your cardiac medications before your next appointment, please call your pharmacy*   Lab Work: NONE If you have labs (blood work) drawn today and your tests are completely normal, you will receive your results only by: [place] (if you have MyChart) OR A paper copy in the mail If you have any lab test that is abnormal or we need to change your treatment, we will call you to review the results.   Testing/Procedures: Your physician has requested that you wear a heart monitor.   Follow-Up: At [facility], you and your health needs are our priority.  As part of our continuing mission to provide you with exceptional heart care, we have created designated Provider Care Teams.  These Care Teams include your primary Cardiologist (physician) and Advanced Practice Providers (APPs -  Physician Assistants and Nurse Practitioners) who all work together to provide you with the care you need, when you need it.  We recommend signing up for the patient portal called "MyChart".  Sign up information is provided on this After Visit Summary.  MyChart is used to connect with patients for Virtual Visits (Telemedicine).  Patients are able to view lab/test results, encounter notes, upcoming appointments, etc.  Non-urgent messages can be sent to your provider as well.   To learn more about what you can do with MyChart, go to [email or web address].    Your next appointment:   3-4  month(s)  The format for your next appointment:   In Person  Provider:      Other Instructions Please monitor your blood pressure  ZIO XT- Long Term Monitor Instructions  Your physician has requested you wear a ZIO patch monitor for 14 days.  This is a single patch monitor. [employer] supplies one patch monitor per  enrollment. Additional stickers are not available. Please do not apply patch if you will be having a Nuclear Stress Test,  Echocardiogram, Cardiac CT, MRI, or Chest Xray during the period you would be wearing the  monitor. The patch cannot be worn during these tests. You cannot remove and re-apply the  ZIO XT patch monitor.  Your ZIO patch monitor will be mailed 3 day USPS to your address on file. It may take 3-5 days  to receive your monitor after you have been enrolled.  Once you have received your monitor, please review the enclosed instructions. Your monitor  has already been registered assigning a specific monitor serial # to you.  Billing and Patient Assistance Program Information  We have supplied [employer] with any of your insurance information on file for billing purposes. [employer] offers a sliding scale Patient Assistance Program for patients that do not have  insurance, or whose insurance does not completely cover the cost of the ZIO monitor.  You must apply for the Patient Assistance Program to qualify for this discounted rate.  To apply, please call [employer] at [phone], select option 4, select option 2, ask to apply for  Patient Assistance Program. Theodore Demark will ask your household income, and how many people  are in your household. They will quote your out-of-pocket cost based on that information.  [employer] will also be able to set up a 52-month, interest-free payment plan if needed.  Applying the monitor   Shave hair from upper left chest.  Hold abrader disc by orange tab.  Rub abrader in 40 strokes over the upper left chest as  indicated in your monitor instructions.  Clean area with 4 enclosed alcohol pads. Let dry.  Apply patch as indicated in monitor instructions. Patch will be placed under collarbone on left  side of chest with arrow pointing upward.  Rub patch adhesive wings for 2 minutes. Remove white label marked "1". Remove the white  label marked "2". Rub patch  adhesive wings for 2 additional minutes.  While looking in a mirror, press and release button in center of patch. A small green light will  flash 3-4 times. This will be your only indicator that the monitor has been turned on.  Do not shower for the first 24 hours. You may shower after the first 24 hours.  Press the button if you feel a symptom. You will hear a small click. Record Date, Time and  Symptom in the Patient Logbook.  When you are ready to remove the patch, follow instructions on the last 2 pages of Patient  Logbook. Stick patch monitor onto the last page of Patient Logbook.  Place Patient Logbook in the blue and white box. Use locking tab on box and tape box closed  securely. The blue and white box has prepaid postage on it. Please place it in the mailbox as  soon as possible. Your physician should have your test results approximately 7 days after the  monitor has been mailed back to [facility].  Call [place] at [phone] if you have questions regarding  your ZIO XT patch monitor. Call them immediately if you see an orange light blinking on your  monitor.  If your monitor falls off in less than 4 days, contact our Monitor department at [phone].  If your monitor becomes loose or falls off after 4 days call [employer] at [phone] for  suggestions on securing your monitor

## 2021-01-09 NOTE — Progress Notes (Unsigned)
Enrolled patient for a 14 day Zio XT  monitor to be mailed to patients home  °

## 2021-01-16 ENCOUNTER — Other Ambulatory Visit: Payer: Self-pay

## 2021-01-16 DIAGNOSIS — I4891 Unspecified atrial fibrillation: Secondary | ICD-10-CM

## 2021-01-16 DIAGNOSIS — K55069 Acute infarction of intestine, part and extent unspecified: Secondary | ICD-10-CM

## 2021-01-20 ENCOUNTER — Other Ambulatory Visit: Payer: Self-pay

## 2021-01-20 ENCOUNTER — Ambulatory Visit: Payer: Self-pay | Attending: Nurse Practitioner | Admitting: Nurse Practitioner

## 2021-01-20 ENCOUNTER — Encounter: Payer: Self-pay | Admitting: Nurse Practitioner

## 2021-01-20 VITALS — BP 151/99 | HR 92 | Ht 74.0 in | Wt 294.4 lb

## 2021-01-20 DIAGNOSIS — E1165 Type 2 diabetes mellitus with hyperglycemia: Secondary | ICD-10-CM

## 2021-01-20 DIAGNOSIS — Z1211 Encounter for screening for malignant neoplasm of colon: Secondary | ICD-10-CM

## 2021-01-20 DIAGNOSIS — I10 Essential (primary) hypertension: Secondary | ICD-10-CM

## 2021-01-20 DIAGNOSIS — M1A9XX0 Chronic gout, unspecified, without tophus (tophi): Secondary | ICD-10-CM

## 2021-01-20 DIAGNOSIS — E785 Hyperlipidemia, unspecified: Secondary | ICD-10-CM

## 2021-01-20 LAB — POCT GLYCOSYLATED HEMOGLOBIN (HGB A1C): Hemoglobin A1C: 11.3 % — AB (ref 4.0–5.6)

## 2021-01-20 LAB — GLUCOSE, POCT (MANUAL RESULT ENTRY): POC Glucose: 211 mg/dl — AB (ref 70–99)

## 2021-01-20 MED ORDER — TRULICITY 3 MG/0.5ML ~~LOC~~ SOAJ
3.0000 mg | SUBCUTANEOUS | 1 refills | Status: DC
  Filled 2021-01-20 – 2021-02-01 (×2): qty 2, 28d supply, fill #0

## 2021-01-20 MED ORDER — FREESTYLE LIBRE 14 DAY SENSOR MISC: 6 refills | Status: DC

## 2021-01-20 MED ORDER — VALSARTAN 80 MG PO TABS
80.0000 mg | ORAL_TABLET | Freq: Every day | ORAL | 3 refills | Status: DC
Start: 2021-01-20 — End: 2021-02-24
  Filled 2021-01-20: qty 90, 90d supply, fill #0
  Filled 2021-01-31 – 2021-02-20 (×2): qty 30, 30d supply, fill #0
  Filled 2021-02-20: qty 30, 30d supply, fill #1

## 2021-01-20 MED ORDER — ALLOPURINOL 100 MG PO TABS
100.0000 mg | ORAL_TABLET | Freq: Every day | ORAL | 1 refills | Status: DC
  Filled 2021-01-20: qty 90, 90d supply, fill #0

## 2021-01-20 MED ORDER — ATORVASTATIN CALCIUM 40 MG PO TABS
40.0000 mg | ORAL_TABLET | Freq: Every day | ORAL | 3 refills | Status: DC
  Filled 2021-01-20: qty 90, 90d supply, fill #0
  Filled 2021-01-31: qty 30, 30d supply, fill #0
  Filled 2021-03-08: qty 30, 30d supply, fill #1
  Filled 2021-03-08 (×2): qty 30, 30d supply, fill #0

## 2021-01-20 MED ORDER — FREESTYLE LIBRE 14 DAY READER DEVI: 0 refills | Status: DC

## 2021-01-20 NOTE — Progress Notes (Signed)
Assessment & Plan:  Riley Wiggins was seen today for diabetes and hypertension.  Diagnoses and all orders for this visit:  Type 2 diabetes mellitus with hyperglycemia, without long-term current use of insulin (HCC) -     POCT glucose (manual entry) -     POCT glycosylated hemoglobin (Hb A1C) -     CMP14+EGFR -     Dulaglutide (TRULICITY) 3 XQ/1.1HE SOPN; Inject 3 mg as directed once a week. -     Continuous Blood Gluc Receiver (FREESTYLE LIBRE 14 DAY READER) DEVI; Use as instructed. Check blood glucose level by fingerstick 3-4 times per day.  E11.65 -     Continuous Blood Gluc Sensor (FREESTYLE LIBRE 14 DAY SENSOR) MISC; Use as instructed. Check blood glucose level by fingerstick 3-4 times per day.  E11.65 Continue blood sugar control as discussed in office today, low carbohydrate diet, and regular physical exercise as tolerated, 150 minutes per week (30 min each day, 5 days per week, or 50 min 3 days per week). Keep blood sugar logs with fasting goal of 90-130 mg/dl, post prandial (after you eat) less than 180.  For Hypoglycemia: BS <60 and Hyperglycemia BS >400; contact the clinic ASAP. Annual eye exams and foot exams are recommended.   Essential hypertension -     valsartan (DIOVAN) 80 MG tablet; Take 1 tablet (80 mg total) by mouth daily. NEEDS PASS Continue all antihypertensives as prescribed.  Remember to bring in your blood pressure log with you for your follow up appointment.  DASH/Mediterranean Diets are healthier choices for HTN.    Dyslipidemia, goal LDL below 70 -     Lipid panel -     atorvastatin (LIPITOR) 40 MG tablet; Take 1 tablet (40 mg total) by mouth daily. Please fill as a 90 day supply INSTRUCTIONS: Work on a low fat, heart healthy diet and participate in regular aerobic exercise program by working out at least 150 minutes per week; 5 days a week-30 minutes per day. Avoid red meat/beef/steak,  fried foods. junk foods, sodas, sugary drinks, unhealthy snacking, alcohol and  smoking.  Drink at least 80 oz of water per day and monitor your carbohydrate intake daily.     Colon cancer screening -     Fecal occult blood, imunochemical  Chronic gout without tophus, unspecified cause, unspecified site -     allopurinol (ZYLOPRIM) 100 MG tablet; Take 1 tablet (100 mg total) by mouth daily. Please fill as a 90 day supply   Patient has been counseled on age-appropriate routine health concerns for screening and prevention. These are reviewed and up-to-date. Referrals have been placed accordingly. Immunizations are up-to-date or declined.    Subjective:   Chief Complaint  Patient presents with   Diabetes   Hypertension   HPI Riley Wiggins 55 y.o. male presents to office today for follow up to DM and HTN. he has a past medical history of DM 2,, Dyslipidemia, Hyperlipidemia, Hypertension, and Thrombosis (03/15/2020).    DM 2 Today, patient reports medication adherence with Lantus 18 units daily, glimepiride 2 mg daily, Trulicity 1.74 mg weekly and metformin 500 mg XR qAM and 1000 mg XR qPM.  A1c still not at goal. Will increase trulicity to 3.0 mg weekly. He will continue all other medications.  Blood sugars at home daily with a range of 160-240s. Denies any blood sugars < 70. LDL at goal. He is requesting a freestyle libre. He is uninsured at [employer] time. Was given script to take to pharmacy .  Lab Results  Component Value Date   HGBA1C 11.3 (A) 01/20/2021    Lab Results  Component Value Date   HGBA1C >15.5 (H) 10/20/2020    Lab Results  Component Value Date   LDLCALC 70 10/20/2020     HTN Blood pressure not at goal. Will switch losartan 50 mg to valsartan 80 mg daily. He is not monitoring his blood pressure at home as he does not have a device.  BP Readings from Last 3 Encounters:  01/20/21 (!) 151/99  01/09/21 (!) 157/100  08/07/20 135/89    Review of Systems  Constitutional:  Negative for fever, malaise/fatigue and weight loss.  HENT: Negative.   Negative for nosebleeds.   Eyes: Negative.  Negative for blurred vision, double vision and photophobia.  Respiratory: Negative.  Negative for cough and shortness of breath.   Cardiovascular: Negative.  Negative for chest pain, palpitations and leg swelling.  Gastrointestinal: Negative.  Negative for heartburn, nausea and vomiting.  Musculoskeletal: Negative.  Negative for myalgias.  Neurological: Negative.  Negative for dizziness, focal weakness, seizures and headaches.  Psychiatric/Behavioral: Negative.  Negative for suicidal ideas.    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     Past Surgical History:  Procedure Laterality Date   HERNIA REPAIR     " when I was young "   NO PAST SURGERIES      Family History  Problem Relation Age of Onset   Diabetes Mother    Diabetes Father    Pancreatitis Neg Hx    Colon cancer Neg Hx    Stomach cancer Neg Hx     Social History Reviewed with no changes to be made today.   Outpatient Medications Prior to Visit  Medication Sig Dispense Refill   acetaminophen (TYLENOL) 500 MG tablet Take 1,000 mg by mouth every 6 (six) hours as needed for mild pain.     allopurinol (ZYLOPRIM) 100 MG tablet TAKE 1 TABLET (100 MG TOTAL) BY MOUTH DAILY. 30 tablet 1   Ascorbic Acid (VITAMIN C) 1000 MG tablet Take 1,000 mg by mouth daily.     Blood Glucose Monitoring Suppl (TRUE METRIX METER) w/Device KIT Use to check blood sugar TID. 1 kit 0   Garlic 7096 MG CAPS Take 1,200 mg by mouth daily.     glucose blood test strip USE AS DIRECTED TO CHECK BLOOD GLUCOSE TWICE DAILY 100 strip 12   insulin glargine (LANTUS SOLOSTAR) 100 UNIT/ML Solostar Pen Inject 20 Units into the skin daily. 15 mL PRN   Insulin Pen Needle 31G X 5 MM MISC  Use as instructed. Inject into the skin once daily. 100 each 5   metFORMIN (GLUCOPHAGE XR) 500 MG 24 hr tablet Take 2 tablets (1,000 mg total) by mouth in the morning and at bedtime. 120 tablet 2   Multiple Vitamins-Minerals (MULTI FOR HIM 50+) TABS Take 1 tablet by mouth daily.     rivaroxaban (XARELTO) 20 MG TABS tablet TAKE 1 TABLET (20 MG TOTAL) BY MOUTH DAILY WITH SUPPER. 30 tablet 4   TRUEplus Lancets 28G MISC CHECK BLOOD GLUCOSE LEVEL BY FINGERSTICK TWICE PER DAY. E11.65. 100 each 1   atorvastatin (LIPITOR) 40 MG tablet Take 1 tablet (40 mg total) by mouth daily. 90 tablet 3  Dulaglutide (TRULICITY) 1.5 VP/0.3EK SOPN Inject 1.5 mg into the skin once a week. 2 mL 2   losartan (COZAAR) 50 MG tablet TAKE 1 TABLET (50 MG TOTAL) BY MOUTH DAILY. 90 tablet 3   No facility-administered medications prior to visit.    No Known Allergies     Objective:    BP (!) 151/99    Pulse 92    Ht $R'6\' 2"'HA$  (1.88 m)    Wt 294 lb 6 oz (133.5 kg)    SpO2 97%    BMI 37.80 kg/m  Wt Readings from Last 3 Encounters:  01/20/21 294 lb 6 oz (133.5 kg)  01/09/21 299 lb (135.6 kg)  08/07/20 280 lb (127 kg)    Physical Exam Vitals and nursing note reviewed.  Constitutional:      Appearance: He is well-developed.  HENT:     Head: Normocephalic and atraumatic.  Cardiovascular:     Rate and Rhythm: Normal rate and regular rhythm.     Heart sounds: Normal heart sounds. No murmur heard.   No friction rub. No gallop.  Pulmonary:     Effort: Pulmonary effort is normal. No tachypnea or respiratory distress.     Breath sounds: Normal breath sounds. No decreased breath sounds, wheezing, rhonchi or rales.  Chest:     Chest wall: No tenderness.  Abdominal:     General: Bowel sounds are normal.     Palpations: Abdomen is soft.  Musculoskeletal:        General: Normal range of motion.     Cervical back: Normal range of motion.  Skin:    General: Skin is warm and dry.  Neurological:     Mental Status: He is  alert and oriented to person, place, and time.     Coordination: Coordination normal.  Psychiatric:        Behavior: Behavior normal. Behavior is cooperative.        Thought Content: Thought content normal.        Judgment: Judgment normal.         Patient has been counseled extensively about nutrition and exercise as well as the importance of adherence with medications and regular follow-up. The patient was given clear instructions to go to ER or return to medical center if symptoms don't improve, worsen or new problems develop. The patient verbalized understanding.   Follow-up: Return in about 4 weeks (around 02/17/2021) for 4 week meter check luke. see me in 3 months.   Gildardo Pounds, FNP-BC [facility] [place] [facility] [place], [place]   01/20/2021, 3:23 PM

## 2021-01-21 LAB — CMP14+EGFR
ALT: 35 IU/L (ref 0–44)
AST: 26 IU/L (ref 0–40)
Albumin/Globulin Ratio: 2.1 (ref 1.2–2.2)
Albumin: 4.9 g/dL (ref 3.8–4.9)
Alkaline Phosphatase: 93 IU/L (ref 44–121)
BUN/Creatinine Ratio: 12 (ref 9–20)
BUN: 11 mg/dL (ref 6–24)
Bilirubin Total: 0.4 mg/dL (ref 0.0–1.2)
CO2: 25 mmol/L (ref 20–29)
Calcium: 10.3 mg/dL — ABNORMAL HIGH (ref 8.7–10.2)
Chloride: 102 mmol/L (ref 96–106)
Creatinine, Ser: 0.91 mg/dL (ref 0.76–1.27)
Globulin, Total: 2.3 g/dL (ref 1.5–4.5)
Glucose: 204 mg/dL — ABNORMAL HIGH (ref 70–99)
Potassium: 4.5 mmol/L (ref 3.5–5.2)
Sodium: 142 mmol/L (ref 134–144)
Total Protein: 7.2 g/dL (ref 6.0–8.5)
eGFR: 100 mL/min/{1.73_m2} (ref >59)

## 2021-01-21 LAB — LIPID PANEL
Chol/HDL Ratio: 3.2 ratio (ref 0.0–5.0)
Cholesterol, Total: 159 mg/dL (ref 100–199)
HDL: 50 mg/dL (ref >39)
LDL Chol Calc (NIH): 84 mg/dL (ref 0–99)
Triglycerides: 146 mg/dL (ref 0–149)
VLDL Cholesterol Cal: 25 mg/dL (ref 5–40)

## 2021-01-27 ENCOUNTER — Other Ambulatory Visit: Payer: Self-pay

## 2021-01-31 ENCOUNTER — Other Ambulatory Visit: Payer: Self-pay

## 2021-02-01 ENCOUNTER — Other Ambulatory Visit: Payer: Self-pay

## 2021-02-01 ENCOUNTER — Telehealth: Payer: Self-pay | Admitting: Nurse Practitioner

## 2021-02-01 ENCOUNTER — Other Ambulatory Visit: Payer: Self-pay | Admitting: Nurse Practitioner

## 2021-02-01 MED ORDER — VICTOZA 18 MG/3ML ~~LOC~~ SOPN
1.8000 mg | PEN_INJECTOR | Freq: Every day | SUBCUTANEOUS | 6 refills | Status: DC
  Filled 2021-02-01: qty 9, 30d supply, fill #0

## 2021-02-01 NOTE — Telephone Encounter (Signed)
Copied from [place] [id number]. Topic: General - Other >> Jan 31, 2021  8:52 AM Riley Wiggins wrote: Reason for CRM: The patient has been told by their pharmacist that their recently prescribed medication Rx #: [id number]  Dulaglutide (TRULICITY) 3 HA/5.7XU SOPN [[id number]]   Is currently on backorder and they are uncertain of when the medication will be in stock  The patient would like to be contacted by Wiggins member of staff and provided with instructions on what to do for medication management   Please contact when available

## 2021-02-01 NOTE — Telephone Encounter (Signed)
Victoza 1.8 mg daily has been sent.

## 2021-02-01 NOTE — Telephone Encounter (Signed)
Called and informed that a new RXhas been sent.

## 2021-02-02 ENCOUNTER — Other Ambulatory Visit: Payer: Self-pay

## 2021-02-02 ENCOUNTER — Other Ambulatory Visit (HOSPITAL_COMMUNITY): Payer: Self-pay

## 2021-02-02 ENCOUNTER — Other Ambulatory Visit: Payer: Self-pay | Admitting: Nurse Practitioner

## 2021-02-02 ENCOUNTER — Telehealth: Payer: Self-pay

## 2021-02-02 ENCOUNTER — Ambulatory Visit (HOSPITAL_COMMUNITY): Payer: Self-pay | Attending: Internal Medicine

## 2021-02-02 DIAGNOSIS — I152 Hypertension secondary to endocrine disorders: Secondary | ICD-10-CM

## 2021-02-02 DIAGNOSIS — K55069 Acute infarction of intestine, part and extent unspecified: Secondary | ICD-10-CM | POA: Insufficient documentation

## 2021-02-02 LAB — ECHOCARDIOGRAM COMPLETE
Area-P 1/2: 3.21 cm2
S' Lateral: 3.2 cm

## 2021-02-02 MED ORDER — PERFLUTREN LIPID MICROSPHERE
1.0000 mL | INTRAVENOUS | Status: AC | PRN
  Administered 2021-02-02: 3 mL via INTRAVENOUS

## 2021-02-02 NOTE — Telephone Encounter (Signed)
I have spoken to patient as well. He is aware victoza is being stopped.

## 2021-02-02 NOTE — Telephone Encounter (Signed)
Patient visited pharmacy this morning to pick up Victoza.  I explained to patient that I had discussed with his provider that a therapy change was not needed at this time.  Patient acknowledged the conversation that we had had yesterday in regards to getting patient assistance([employer]) for refills on the Trulicity when he picked up a 1 month supply of free medication and filled out an application.  Patient is aware that Trulicity will not be changed at this time as we are processing an application for patient assistance through the [facility] where the medication is currently available/in stock.  We expect a resolution before his next refill is due, 28 days from yesterdays date.  Patient did not have a shipping preference, home vs clinic, so we will have meds shipped to clinic.

## 2021-02-03 ENCOUNTER — Other Ambulatory Visit: Payer: Self-pay

## 2021-02-05 DIAGNOSIS — Z860101 Personal history of adenomatous and serrated colon polyps: Secondary | ICD-10-CM

## 2021-02-05 HISTORY — DX: Personal history of adenomatous and serrated colon polyps: Z86.0101

## 2021-02-05 HISTORY — PX: COLONOSCOPY: SHX174

## 2021-02-20 ENCOUNTER — Other Ambulatory Visit: Payer: Self-pay

## 2021-02-24 ENCOUNTER — Encounter: Payer: Self-pay | Admitting: Pharmacist

## 2021-02-24 ENCOUNTER — Other Ambulatory Visit: Payer: Self-pay

## 2021-02-24 ENCOUNTER — Ambulatory Visit: Payer: Managed Care, Other (non HMO) | Attending: Nurse Practitioner | Admitting: Pharmacist

## 2021-02-24 DIAGNOSIS — I10 Essential (primary) hypertension: Secondary | ICD-10-CM

## 2021-02-24 DIAGNOSIS — E1165 Type 2 diabetes mellitus with hyperglycemia: Secondary | ICD-10-CM

## 2021-02-24 DIAGNOSIS — M1A9XX0 Chronic gout, unspecified, without tophus (tophi): Secondary | ICD-10-CM

## 2021-02-24 MED ORDER — METFORMIN HCL 1000 MG PO TABS
1000.0000 mg | ORAL_TABLET | Freq: Two times a day (BID) | ORAL | 1 refills | Status: DC
  Filled 2021-02-24: qty 60, 30d supply, fill #0

## 2021-02-24 MED ORDER — BASAGLAR KWIKPEN 100 UNIT/ML ~~LOC~~ SOPN
14.0000 [IU] | PEN_INJECTOR | Freq: Two times a day (BID) | SUBCUTANEOUS | 99 refills | Status: DC
  Filled 2021-02-24: qty 15, 54d supply, fill #0
  Filled 2021-02-24: qty 9, 30d supply, fill #0

## 2021-02-24 MED ORDER — VALSARTAN 160 MG PO TABS
160.0000 mg | ORAL_TABLET | Freq: Every day | ORAL | 3 refills | Status: DC
  Filled 2021-02-24: qty 30, 30d supply, fill #0

## 2021-02-24 MED ORDER — ALLOPURINOL 100 MG PO TABS
ORAL_TABLET | Freq: Every day | ORAL | 1 refills | Status: DC
  Filled 2021-02-24: qty 30, 30d supply, fill #0

## 2021-02-24 NOTE — Progress Notes (Signed)
° ° °  S:    PCP: Riley Wiggins  Patient arrives in good spirits.  Presents for diabetes evaluation, education, and management . Patient was referred and last seen by Riley Wiggins on 01/20/2021. A1c at that visit 11.3% (down from >15.5%).   Family/Social History: - Family Hx: Diabetes (mother and father) - Social Hx: Former smoker (quit 5 years ago), denies tobacco or smokeless tobacco use   Insurance coverage/medication affordability: dnbi  Medication adherence reported.   Diabetes medications include: Basaglar 20 units daily (takes 10u BID), metformin 1000 mg XR qAM and 3729 mg qPM, Trulicity 1.5 mg weekly  Patient denies hypoglycemic events.  Patient reported dietary habits: Eats 2-3 meals/day Breakfast: cereal or 2 eggs, toast, bacon Lunch: sometimes nothing or banana Dinner: chicken, salads, vegetables, potatoes (states if he has potatoes then he has no bread) Drinks: water, sugar-free juice packets, sugar-free sodas   Patient-reported exercise habits: walks around neighborhood with neighbor   Patient denies nocturia (nighttime urination).  Patient denies neuropathy (nerve pain). Patient denies visual changes. Patient reports self foot exams.     O:   Lab Results  Component Value Date   HGBA1C 11.3 (A) 01/20/2021   Lipid Panel     Component Value Date/Time   CHOL 159 01/20/2021 1211   TRIG 146 01/20/2021 1211   HDL 50 01/20/2021 1211   CHOLHDL 3.2 01/20/2021 1211   CHOLHDL 4.4 08/03/2019 2005   VLDL UNABLE TO CALCULATE IF TRIGLYCERIDE OVER 400 mg/dL 08/03/2019 2005   [place] 84 01/20/2021 1211   LDLDIRECT 57.0 08/03/2019 2005   Clinical Atherosclerotic Cardiovascular Disease (ASCVD):  The 10-year ASCVD risk score (Arnett DK, et al., 2019) is: 38.8%   Values used to calculate the score:     Age: 56 years     Sex: Male     Is Non-Hispanic African American: Yes     Diabetic: Yes     Tobacco smoker: Yes     Systolic Blood Pressure: 021 mmHg     Is BP treated: Yes      HDL Cholesterol: 50 mg/dL     Total Cholesterol: 159 mg/dL    A/P: Goal: A1c < 7%, FBG 80-130 mg/dL, and 2H-PPBG < 180 mg/dL.  Diabetes longstanding currently uncontrolled but seems to be improving. Patient is able to verbalize appropriate hypoglycemia management plan. Medication adherence appears optimal. Ideally, we'd continue titrating Trulicity but the higher doses are on backorder. Will increase Lantus instead. - Increased Basaglar to 28 units daily. Patient wishes to take as 14u BID.  - Change XR metformin to regular release metformin 1000 mg BID per pt request.  - Continue Trulicity 1.5 mg weekly - Extensively discussed pathophysiology of diabetes, recommended lifestyle interventions, dietary effects on blood sugar control - Counseled on s/sx of and management of hypoglycemia - Counseled on medication related side effects such as diarrhea, nausea, vomiting, injection site reaction, abdominal pain due to history of pancreatitis - Next A1C anticipated December 2022.   Follow up in 4 weeks with me.  Written patient instructions provided.  Total time in face to face counseling 30 minutes.    Riley Wiggins, PharmD, [place], [place] [phone]

## 2021-02-27 ENCOUNTER — Other Ambulatory Visit: Payer: Self-pay

## 2021-02-28 ENCOUNTER — Other Ambulatory Visit: Payer: Self-pay

## 2021-03-03 ENCOUNTER — Other Ambulatory Visit: Payer: Self-pay

## 2021-03-03 ENCOUNTER — Other Ambulatory Visit: Payer: Self-pay | Admitting: Nurse Practitioner

## 2021-03-03 DIAGNOSIS — E1165 Type 2 diabetes mellitus with hyperglycemia: Secondary | ICD-10-CM

## 2021-03-03 MED ORDER — TRULICITY 3 MG/0.5ML ~~LOC~~ SOAJ
3.0000 mg | SUBCUTANEOUS | 1 refills | Status: DC
  Filled 2021-03-03 – 2021-03-24 (×3): qty 2, 28d supply, fill #0
  Filled 2021-05-03: qty 2, 28d supply, fill #1

## 2021-03-03 NOTE — Telephone Encounter (Signed)
Requested medication (s) are due for refill today - no  Requested medication (s) are on the active medication list -no  Future visit scheduled -yes  Last refill: unsure- discontinued medication  Notes to clinic: Request RF: medication no longer current on medication list  Requested Prescriptions  Pending Prescriptions Disp Refills   Dulaglutide (TRULICITY) 3 OE/4.2PN SOPN 2 mL 1    Sig: Inject 3 mg as directed once a week.     Endocrinology:  Diabetes - GLP-1 Receptor Agonists Failed - 03/03/2021 10:58 AM      Failed - HBA1C is between 0 and 7.9 and within 180 days    Hemoglobin A1C  Date Value Ref Range Status  01/20/2021 11.3 (A) 4.0 - 5.6 % Final   HbA1c, POC (controlled diabetic range)  Date Value Ref Range Status  02/03/2020 13.1 (A) 0.0 - 7.0 % Final   Hgb A1c MFr Bld  Date Value Ref Range Status  10/20/2020 >15.5 (H) 4.8 - 5.6 % Final    Comment:             Prediabetes: 5.7 - 6.4          Diabetes: >6.4          Glycemic control for adults with diabetes: <7.0           Passed - Valid encounter within last 6 months    Recent Outpatient Visits           1 week ago Essential hypertension   [place], Annie Main L, RPH-CPP   1 month ago Type 2 diabetes mellitus with hyperglycemia, without long-term current use of insulin [facility])   [place], [place] W, NP   3 months ago Type 2 diabetes mellitus with hyperglycemia, without long-term current use of insulin [facility])   [place], Annie Main L, RPH-CPP   4 months ago Type 2 diabetes mellitus with hyperglycemia, without long-term current use of insulin [facility])   [place], [place] W, NP   10 months ago Type 2 diabetes mellitus with hyperglycemia, without long-term current use of insulin [facility])   [place] Gildardo Pounds, NP        Future Appointments             In 3 weeks Tresa Endo, [place]   In 1 month [place], Terisa Starr, MD [place], [facility]   In 1 month Gildardo Pounds, NP [place]               Requested Prescriptions  Pending Prescriptions Disp Refills   Dulaglutide (TRULICITY) 3 TI/1.4ER SOPN 2 mL 1    Sig: Inject 3 mg as directed once a week.     Endocrinology:  Diabetes - GLP-1 Receptor Agonists Failed - 03/03/2021 10:58 AM      Failed - HBA1C is between 0 and 7.9 and within 180 days    Hemoglobin A1C  Date Value Ref Range Status  01/20/2021 11.3 (A) 4.0 - 5.6 % Final   HbA1c, POC (controlled diabetic range)  Date Value Ref Range Status  02/03/2020 13.1 (A) 0.0 - 7.0 % Final   Hgb A1c MFr Bld  Date Value Ref Range Status  10/20/2020 >15.5 (H) 4.8 - 5.6 % Final    Comment:  Prediabetes: 5.7 - 6.4          Diabetes: >6.4          Glycemic control for adults with diabetes: <7.0           Passed - Valid encounter within last 6 months    Recent Outpatient Visits           1 week ago Essential hypertension   [place], Annie Main L, RPH-CPP   1 month ago Type 2 diabetes mellitus with hyperglycemia, without long-term current use of insulin [facility])   [place], [place] W, NP   3 months ago Type 2 diabetes mellitus with hyperglycemia, without long-term current use of insulin [facility])   [place], Annie Main L, RPH-CPP   4 months ago Type 2 diabetes mellitus with hyperglycemia, without long-term current use of insulin [facility])   [place], [place] W, NP   10 months ago Type 2 diabetes mellitus with hyperglycemia, without long-term current use of insulin [facility])   [place] Gildardo Pounds, NP       Future Appointments             In 3 weeks Tresa Endo, [place]   In 1 month [place], Terisa Starr, MD [place], [facility]   In 1 month Gildardo Pounds, NP [place]

## 2021-03-06 ENCOUNTER — Other Ambulatory Visit: Payer: Self-pay

## 2021-03-08 ENCOUNTER — Other Ambulatory Visit: Payer: Self-pay

## 2021-03-13 ENCOUNTER — Other Ambulatory Visit: Payer: Self-pay

## 2021-03-16 LAB — HM DIABETES EYE EXAM

## 2021-03-20 ENCOUNTER — Other Ambulatory Visit: Payer: Self-pay

## 2021-03-24 ENCOUNTER — Other Ambulatory Visit: Payer: Self-pay

## 2021-03-24 ENCOUNTER — Other Ambulatory Visit: Payer: Self-pay | Admitting: Pharmacist

## 2021-03-24 DIAGNOSIS — E1165 Type 2 diabetes mellitus with hyperglycemia: Secondary | ICD-10-CM

## 2021-03-24 MED ORDER — FREESTYLE LIBRE 2 SENSOR MISC: 3 refills | Status: DC

## 2021-03-24 MED ORDER — FREESTYLE LIBRE 2 READER DEVI: 3 refills | Status: DC

## 2021-03-28 ENCOUNTER — Ambulatory Visit: Payer: Managed Care, Other (non HMO) | Admitting: Pharmacist

## 2021-03-29 ENCOUNTER — Other Ambulatory Visit: Payer: Self-pay

## 2021-03-31 ENCOUNTER — Other Ambulatory Visit: Payer: Self-pay

## 2021-04-04 ENCOUNTER — Other Ambulatory Visit: Payer: Self-pay

## 2021-04-10 ENCOUNTER — Other Ambulatory Visit: Payer: Self-pay

## 2021-04-18 ENCOUNTER — Ambulatory Visit: Payer: Managed Care, Other (non HMO) | Admitting: Internal Medicine

## 2021-04-21 ENCOUNTER — Ambulatory Visit: Payer: Self-pay | Admitting: Nurse Practitioner

## 2021-04-25 ENCOUNTER — Ambulatory Visit: Payer: Managed Care, Other (non HMO) | Admitting: Pharmacist

## 2021-04-26 ENCOUNTER — Ambulatory Visit: Payer: Self-pay | Admitting: Critical Care Medicine

## 2021-04-26 NOTE — Progress Notes (Incomplete)
? ?Established Patient Office Visit ? ?Subjective:  ?Patient ID: Riley Wiggins, male    DOB: 05-27-1965  Age: 56 y.o. MRN: [id number] ? ?CC: No chief complaint on file. ? ? ?HPI ?Rafael Salway presents for  ? ?Medication adherence reported.   ?Diabetes medications include: Basaglar 20 units daily (takes 10u BID), metformin 1000 mg XR qAM and 0141 mg qPM, Trulicity 1.5 mg weekly ?  ?Patient denies hypoglycemic events. ?  ?Patient reported dietary habits: Eats 2-3 meals/day ?Breakfast: cereal or 2 eggs, toast, bacon ?Lunch: sometimes nothing or banana ?Dinner: chicken, salads, vegetables, potatoes (states if he has potatoes then he has no bread) ?Drinks: water, sugar-free juice packets, sugar-free sodas ?  ?Patient-reported exercise habits: walks around neighborhood with neighbor ?  ?Patient denies nocturia (nighttime urination).  ?Patient denies neuropathy (nerve pain). ?Patient denies visual changes. ?Patient reports self foot exams.  ?  ?  ?O: ?  ?Recent Labs  ?     ?Lab Results  ?Component Value Date  ?  HGBA1C 11.3 (A) 01/20/2021  ?  ?  ?Lipid Panel  ?Labs (Brief)  ?     ?   ?Component Value Date/Time  ?  CHOL 159 01/20/2021 1211  ?  TRIG 146 01/20/2021 1211  ?  HDL 50 01/20/2021 1211  ?  CHOLHDL 3.2 01/20/2021 1211  ?  CHOLHDL 4.4 08/03/2019 2005  ?  VLDL UNABLE TO CALCULATE IF TRIGLYCERIDE OVER 400 mg/dL 08/03/2019 2005  ?  [place] 84 01/20/2021 1211  ?  LDLDIRECT 57.0 08/03/2019 2005  ?  ?  ?Clinical Atherosclerotic Cardiovascular Disease (ASCVD):  ?The 10-year ASCVD risk score (Arnett DK, et al., 2019) is: 38.8% ?  Values used to calculate the score: ?    Age: 80 years ?    Sex: Male ?    Is Non-Hispanic African American: Yes ?    Diabetic: Yes ?    Tobacco smoker: Yes ?    Systolic Blood Pressure: 030 mmHg ?    Is BP treated: Yes ?    HDL Cholesterol: 50 mg/dL ?    Total Cholesterol: 159 mg/dL  ?  ?  ?A/P: ?Goal: A1c < 7%, FBG 80-130 mg/dL, and 2H-PPBG < 180 mg/dL.  ?Diabetes longstanding currently uncontrolled  but seems to be improving. Patient is able to verbalize appropriate hypoglycemia management plan. Medication adherence appears optimal. Ideally, we'd continue titrating Trulicity but the higher doses are on backorder. Will increase Lantus instead. ?- Increased Basaglar to 28 units daily. Patient wishes to take as 14u BID.  ?- Change XR metformin to regular release metformin 1000 mg BID per pt request.  ?- Continue Trulicity 1.5 mg weekly ?- Extensively discussed pathophysiology of diabetes, recommended lifestyle interventions, dietary effects on blood sugar control ?- Counseled on s/sx of and management of hypoglycemia ?- Counseled on medication related side effects such as diarrhea, nausea, vomiting, injection site reaction, abdominal pain due to history of pancreatitis ?- Next A1C anticipated December 2022.  ?  ?Foot  ? ?Type 2 diabetes mellitus with hyperglycemia, without long-term current use of insulin (HCC) ?-     POCT glucose (manual entry) ?-     POCT glycosylated hemoglobin (Hb A1C) ?-     CMP14+EGFR ?-     Dulaglutide (TRULICITY) 3 DT/1.4HO SOPN; Inject 3 mg as directed once a week. ?-     Continuous Blood Gluc Receiver (FREESTYLE LIBRE 14 DAY READER) DEVI; Use as instructed. Check blood glucose level by fingerstick 3-4 times per day.  E11.65 ?-  Continuous Blood Gluc Sensor (FREESTYLE LIBRE 14 DAY SENSOR) MISC; Use as instructed. Check blood glucose level by fingerstick 3-4 times per day.  E11.65 ?Continue blood sugar control as discussed in office today, low carbohydrate diet, and regular physical exercise as tolerated, 150 minutes per week (30 min each day, 5 days per week, or 50 min 3 days per week). Keep blood sugar logs with fasting goal of 90-130 mg/dl, post prandial (after you eat) less than 180.  ?For Hypoglycemia: BS <60 and Hyperglycemia BS >400; contact the clinic ASAP. ?Annual eye exams and foot exams are recommended.  ?  ?Essential hypertension ?-     valsartan (DIOVAN) 80 MG tablet; Take  1 tablet (80 mg total) by mouth daily. NEEDS PASS ?Continue all antihypertensives as prescribed.  ?Remember to bring in your blood pressure log with you for your follow up appointment.  ?DASH/Mediterranean Diets are healthier choices for HTN.   ?  ?Dyslipidemia, goal LDL below 70 ?-     Lipid panel ?-     atorvastatin (LIPITOR) 40 MG tablet; Take 1 tablet (40 mg total) by mouth daily. Please fill as a 90 day supply ?INSTRUCTIONS: Work on a low fat, heart healthy diet and participate in regular aerobic exercise program by working out at least 150 minutes per week; 5 days a week-30 minutes per day. Avoid red meat/beef/steak,  fried foods. junk foods, sodas, sugary drinks, unhealthy snacking, alcohol and smoking.  Drink at least 80 oz of water per day and monitor your carbohydrate intake daily.   ?  ?  ?Colon cancer screening ?-     Fecal occult blood, imunochemical ?  ?Chronic gout without tophus, unspecified cause, unspecified site ?-     allopurinol (ZYLOPRIM) 100 MG tablet; Take 1 tablet (100 mg t ?Past Medical History:  ?Diagnosis Date  ?? Diabetes mellitus without complication ([place])   ?? Dyslipidemia   ?? Hyperlipidemia   ?? Hypertension   ?? 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   ? ? ?Past Surgical History:  ?Procedure Laterality Date  ?? HERNIA REPAIR    ? " when I was young "  ?? NO PAST SURGERIES    ? ? ?Family History  ?Problem Relation Age of Onset  ?? Diabetes Mother   ?? Diabetes Father   ?? Pancreatitis Neg Hx   ?? Colon cancer Neg Hx   ?? Stomach cancer Neg Hx   ? ? ?Social History  ? ?Socioeconomic History  ?? Marital status: Single  ?  Spouse name: Not on file  ?? Number of children: Not on file  ?? Years of education: Not on file  ?? Highest education level: Not on file  ?Occupational History  ?? Not on file  ?Tobacco Use  ?? Smoking status: Former  ?  Types: Cigarettes  ?? Smokeless tobacco: Never   ?? Tobacco comments:  ?  Quit smoking 5 years ago  ?Vaping Use  ?? Vaping Use: Never used  ?Substance and Sexual Activity  ?? Alcohol use: Not Currently  ?  Comment: rare  ?? Drug use: No  ?? Sexual activity: Yes  ?Other Topics Concern  ?? Not on file  ?Social History Narrative  ? Works at [employer]  ? Lives in [place] with girlfriend   ? ?Social Determinants of Health  ? ?Financial Resource Strain: Not on file  ?Food Insecurity: Not on file  ?Transportation Needs: Not on  file  ?Physical Activity: Not on file  ?Stress: Not on file  ?Social Connections: Not on file  ?Intimate Partner Violence: Not on file  ? ? ?Outpatient Medications Prior to Visit  ?Medication Sig Dispense Refill  ?? Continuous Blood Gluc Receiver (FREESTYLE LIBRE 2 READER) DEVI Check blood glucose level by fingerstick 3-4 times per day.  E11.65 1 each 3  ?? Continuous Blood Gluc Sensor (FREESTYLE LIBRE 2 SENSOR) MISC Check blood glucose level by fingerstick 3-4 times per day.  E11.65 1 each 3  ?? acetaminophen (TYLENOL) 500 MG tablet Take 1,000 mg by mouth every 6 (six) hours as needed for mild pain.    ?? allopurinol (ZYLOPRIM) 100 MG tablet TAKE 1 TABLET (100 MG TOTAL) BY MOUTH DAILY. 90 tablet 1  ?? Ascorbic Acid (VITAMIN C) 1000 MG tablet Take 1,000 mg by mouth daily.    ?? atorvastatin (LIPITOR) 40 MG tablet Take 1 tablet (40 mg total) by mouth daily. 90 tablet 3  ?? Blood Glucose Monitoring Suppl (TRUE METRIX METER) w/Device KIT Use to check blood sugar TID. 1 kit 0  ?? Garlic 5973 MG CAPS Take 1,200 mg by mouth daily.    ?? glucose blood test strip USE AS DIRECTED TO CHECK BLOOD GLUCOSE TWICE DAILY 100 strip 12  ?? Insulin Glargine (BASAGLAR KWIKPEN) 100 UNIT/ML Inject 14 Units into the skin 2 (two) times daily. 15 mL PRN  ?? Insulin Pen Needle 31G X 5 MM MISC Use as instructed. Inject into the skin once daily. 100 each 5  ?? metFORMIN (GLUCOPHAGE) 1000 MG tablet Take 1 tablet (1,000 mg total) by mouth 2 (two) times daily with a meal.  180 tablet 1  ?? Multiple Vitamins-Minerals (MULTI FOR HIM 50+) TABS Take 1 tablet by mouth daily.    ?? rivaroxaban (XARELTO) 20 MG TABS tablet TAKE 1 TABLET (20 MG TOTAL) BY MOUTH DAILY WITH SUPPER.

## 2021-05-03 ENCOUNTER — Other Ambulatory Visit: Payer: Self-pay

## 2021-05-08 ENCOUNTER — Other Ambulatory Visit: Payer: Self-pay

## 2021-05-11 ENCOUNTER — Other Ambulatory Visit: Payer: Self-pay

## 2021-05-11 ENCOUNTER — Ambulatory Visit: Payer: PRIVATE HEALTH INSURANCE | Attending: Nurse Practitioner | Admitting: Pharmacist

## 2021-05-11 VITALS — BP 124/82 | HR 94

## 2021-05-11 DIAGNOSIS — I10 Essential (primary) hypertension: Secondary | ICD-10-CM | POA: Diagnosis not present

## 2021-05-11 DIAGNOSIS — E785 Hyperlipidemia, unspecified: Secondary | ICD-10-CM

## 2021-05-11 DIAGNOSIS — E1165 Type 2 diabetes mellitus with hyperglycemia: Secondary | ICD-10-CM

## 2021-05-11 LAB — POCT GLYCOSYLATED HEMOGLOBIN (HGB A1C): HbA1c, POC (controlled diabetic range): 8.4 % — AB (ref 0.0–7.0)

## 2021-05-11 MED ORDER — DEXCOM G6 SENSOR MISC: 2 refills | Status: DC

## 2021-05-11 MED ORDER — TRULICITY 3 MG/0.5ML ~~LOC~~ SOAJ
3.0000 mg | SUBCUTANEOUS | 1 refills | Status: DC
  Filled 2021-05-11 – 2021-06-14 (×2): qty 2, 28d supply, fill #0
  Filled 2021-07-21: qty 2, 28d supply, fill #1

## 2021-05-11 MED ORDER — METFORMIN HCL 1000 MG PO TABS
1000.0000 mg | ORAL_TABLET | Freq: Two times a day (BID) | ORAL | 1 refills | Status: DC
  Filled 2021-05-11: qty 180, 90d supply, fill #0

## 2021-05-11 MED ORDER — BASAGLAR KWIKPEN 100 UNIT/ML ~~LOC~~ SOPN
14.0000 [IU] | PEN_INJECTOR | Freq: Two times a day (BID) | SUBCUTANEOUS | 99 refills | Status: DC
  Filled 2021-05-11: qty 9, 30d supply, fill #0

## 2021-05-11 MED ORDER — VALSARTAN 160 MG PO TABS
160.0000 mg | ORAL_TABLET | Freq: Every day | ORAL | 3 refills | Status: DC
  Filled 2021-05-11: qty 90, 90d supply, fill #0

## 2021-05-11 MED ORDER — ATORVASTATIN CALCIUM 40 MG PO TABS
40.0000 mg | ORAL_TABLET | Freq: Every day | ORAL | 3 refills | Status: DC
  Filled 2021-05-11: qty 90, 90d supply, fill #0
  Filled 2021-07-04: qty 30, 30d supply, fill #0

## 2021-05-11 MED ORDER — DAPAGLIFLOZIN PROPANEDIOL 5 MG PO TABS
5.0000 mg | ORAL_TABLET | Freq: Every day | ORAL | 2 refills | Status: DC
  Filled 2021-05-11: qty 30, 30d supply, fill #0
  Filled 2021-05-31 – 2021-06-07 (×2): qty 30, 30d supply, fill #1
  Filled 2021-07-04: qty 30, 30d supply, fill #2

## 2021-05-11 MED ORDER — DEXCOM G6 TRANSMITTER MISC: 2 refills | Status: DC

## 2021-05-11 MED ORDER — DEXCOM G6 RECEIVER DEVI: 0 refills | Status: DC

## 2021-05-11 NOTE — Progress Notes (Signed)
? ? ?  S:    ?PCP: [name] ? ?Patient arrives in good spirits.  Presents for diabetes evaluation, education, and management . Patient was referred and last seen by [name] on 01/20/2021. I last saw him in January and adjusted his insulin. He has canceled and no-showed appointments since then.  ? ?Today, he presents with no complaints.  ? ?Family/Social History: ?- Family Hx: Diabetes (mother and father) ?- Social Hx: Former smoker (quit 5 years ago), denies tobacco or smokeless tobacco use ?  ?Insurance coverage/medication affordability: dnbi ? ?Medication adherence reported.  ?Diabetes medications include: Basaglar 28 units daily, metformin 1694HW BID, Trulicity 3 mg weekly ?HTN medications include: valsartan 180 mg daily ? ?Patient denies hypoglycemic events. ? ?Patient reported dietary habits: Eats 2-3 meals/day ?Breakfast: cereal or 2 eggs, toast, bacon ?Lunch: sometimes nothing or banana ?Dinner: chicken, salads, vegetables, potatoes (states if he has potatoes then he has no bread) ?Drinks: water, sugar-free juice packets, sugar-free sodas ?  ?Patient-reported exercise habits: walks around neighborhood with neighbor ?  ?Patient denies nocturia (nighttime urination).  ?Patient denies neuropathy (nerve pain). ?Patient denies visual changes. ?Patient reports self foot exams.  ?  ? ?O: ?Vitals:  ? 05/11/21 0933  ?BP: 124/82  ?Pulse: 94  ? ? ?Lab Results  ?Component Value Date  ? HGBA1C 8.4 (A) 05/11/2021  ? ?Lipid Panel  ?   ?Component Value Date/Time  ? CHOL 159 01/20/2021 1211  ? TRIG 146 01/20/2021 1211  ? HDL 50 01/20/2021 1211  ? CHOLHDL 3.2 01/20/2021 1211  ? CHOLHDL 4.4 08/03/2019 2005  ? VLDL UNABLE TO CALCULATE IF TRIGLYCERIDE OVER 400 mg/dL 08/03/2019 2005  ? [place] 84 01/20/2021 1211  ? LDLDIRECT 57.0 08/03/2019 2005  ? ?Clinical Atherosclerotic Cardiovascular Disease (ASCVD):  ?The 10-year ASCVD risk score (Arnett DK, et al., 2019) is: 29.7% ?  Values used to calculate the score: ?    Age: 21  years ?    Sex: Male ?    Is Non-Hispanic African American: Yes ?    Diabetic: Yes ?    Tobacco smoker: Yes ?    Systolic Blood Pressure: 388 mmHg ?    Is BP treated: Yes ?    HDL Cholesterol: 50 mg/dL ?    Total Cholesterol: 159 mg/dL  ? ? ?A/P: ?Goal: A1c < 7%, FBG 80-130 mg/dL, and 2H-PPBG < 180 mg/dL.  ?Diabetes longstanding currently uncontrolled but seems to be improving. Patient is able to verbalize appropriate hypoglycemia management plan. Medication adherence appears optimal. His A1c has trended down from >15% down to 8.4% ?- Start Farxiga 5 mg daily. Would like Jardiance but insurance prefers [place].  ?-Continue current doses of metformin, Basaglar, and Trulicity.   ?- Extensively discussed pathophysiology of diabetes, recommended lifestyle interventions, dietary effects on blood sugar control ?- Counseled on s/sx of and management of hypoglycemia ?- Counseled on medication related side effects such as diarrhea, nausea, vomiting, injection site reaction, abdominal pain due to history of pancreatitis ?- Next A1C anticipated 08/2021.  ? ?HTN:  ?HTN longstanding currently at goal. BP goal is <130/80 mmHg. Pt endorses compliance with valsartan.  ?-Continue valsartan 160 mg daily.  ?-CMP14+eGFR ? ?Follow up with PCP.  ? ?Written patient instructions provided.  Total time in face to face counseling 30 minutes.   ? ?Benard Halsted, PharmD, BCACP, CPP ?Clinical Pharmacist ?[facility] ?[phone] ? ? ?

## 2021-05-11 NOTE — Patient Instructions (Addendum)
Thank you for coming to see me today. Please do the following: ? ?Your A1c was 8.4%. This has improved greatly since December! Keep this up! ? ?Start [place]. Take 1 pill once a day. ?Continue Trulicity, metformin, and Lantus. ?Continue checking blood sugars at home.  ?Continue making the lifestyle changes we've discussed together during our visit. Diet and exercise play a significant role in improving your blood sugars.  ?Follow-up with your PCP at your next visit. ? ? ?Hypoglycemia or low blood sugar:  ? ?Low blood sugar can happen quickly and may become an emergency if not treated right away.  ? ?While this shouldn't happen often, it can be brought upon if you skip a meal or do not eat enough. Also, if your insulin or other diabetes medications are dosed too high, this can cause your blood sugar to go to low.  ? ?Warning signs of low blood sugar include: ?Feeling shaky or dizzy ?Feeling weak or tired  ?Excessive hunger ?Feeling anxious or upset  ?Sweating even when you aren't exercising ? ?What to do if I experience low blood sugar? ?Check your blood sugar with your meter. If lower than 70, proceed to step 2.  ?Treat with 3-4 glucose tablets or 3 packets of regular sugar. If these aren't around, you can try hard candy. Yet another option would be to drink 4 ounces of fruit juice or 6 ounces of REGULAR soda.  ?Re-check your sugar in 15 minutes. If it is still below 70, do what you did in step 2 again. If has come back up, go ahead and eat a snack or small meal at this time.  ? ?

## 2021-05-12 LAB — CMP14+EGFR
ALT: 34 IU/L (ref 0–44)
AST: 29 IU/L (ref 0–40)
Albumin/Globulin Ratio: 1.8 (ref 1.2–2.2)
Albumin: 4.6 g/dL (ref 3.8–4.9)
Alkaline Phosphatase: 72 IU/L (ref 44–121)
BUN/Creatinine Ratio: 13 (ref 9–20)
BUN: 12 mg/dL (ref 6–24)
Bilirubin Total: 0.5 mg/dL (ref 0.0–1.2)
CO2: 20 mmol/L (ref 20–29)
Calcium: 10.1 mg/dL (ref 8.7–10.2)
Chloride: 102 mmol/L (ref 96–106)
Creatinine, Ser: 0.91 mg/dL (ref 0.76–1.27)
Globulin, Total: 2.6 g/dL (ref 1.5–4.5)
Glucose: 189 mg/dL — ABNORMAL HIGH (ref 70–99)
Potassium: 4.1 mmol/L (ref 3.5–5.2)
Sodium: 141 mmol/L (ref 134–144)
Total Protein: 7.2 g/dL (ref 6.0–8.5)
eGFR: 99 mL/min/{1.73_m2} (ref >59)

## 2021-05-19 ENCOUNTER — Other Ambulatory Visit: Payer: Self-pay

## 2021-05-26 ENCOUNTER — Other Ambulatory Visit: Payer: Self-pay

## 2021-05-31 ENCOUNTER — Other Ambulatory Visit: Payer: Self-pay | Admitting: Nurse Practitioner

## 2021-05-31 ENCOUNTER — Other Ambulatory Visit: Payer: Self-pay

## 2021-05-31 DIAGNOSIS — K55069 Acute infarction of intestine, part and extent unspecified: Secondary | ICD-10-CM

## 2021-05-31 MED ORDER — RIVAROXABAN 20 MG PO TABS
ORAL_TABLET | Freq: Every day | ORAL | 4 refills | Status: DC
  Filled 2021-05-31 – 2021-06-07 (×2): qty 30, 30d supply, fill #0
  Filled 2021-07-04 (×2): qty 30, 30d supply, fill #1
  Filled 2021-07-21: qty 30, 30d supply, fill #2

## 2021-06-07 ENCOUNTER — Other Ambulatory Visit: Payer: Self-pay

## 2021-06-14 ENCOUNTER — Other Ambulatory Visit: Payer: Self-pay

## 2021-06-16 ENCOUNTER — Other Ambulatory Visit: Payer: Self-pay

## 2021-07-04 ENCOUNTER — Other Ambulatory Visit: Payer: Self-pay

## 2021-07-05 ENCOUNTER — Other Ambulatory Visit: Payer: Self-pay

## 2021-07-06 LAB — FECAL OCCULT BLOOD, IMMUNOCHEMICAL: Fecal Occult Bld: POSITIVE — AB

## 2021-07-12 ENCOUNTER — Other Ambulatory Visit: Payer: Self-pay | Admitting: Nurse Practitioner

## 2021-07-12 DIAGNOSIS — Z1211 Encounter for screening for malignant neoplasm of colon: Secondary | ICD-10-CM

## 2021-07-21 ENCOUNTER — Other Ambulatory Visit: Payer: Self-pay

## 2021-07-27 ENCOUNTER — Other Ambulatory Visit: Payer: Self-pay

## 2021-07-28 ENCOUNTER — Encounter: Payer: Self-pay | Admitting: Nurse Practitioner

## 2021-07-28 ENCOUNTER — Other Ambulatory Visit: Payer: Self-pay

## 2021-07-28 ENCOUNTER — Ambulatory Visit: Payer: Managed Care, Other (non HMO) | Attending: Nurse Practitioner | Admitting: Nurse Practitioner

## 2021-07-28 VITALS — BP 164/99 | HR 96 | Temp 98.1°F | Ht 73.0 in | Wt 287.0 lb

## 2021-07-28 DIAGNOSIS — I10 Essential (primary) hypertension: Secondary | ICD-10-CM

## 2021-07-28 NOTE — Progress Notes (Addendum)
Assessment & Plan:  Riley Wiggins was seen today for hypertension.  Diagnoses and all orders for this visit:  Primary hypertension Follow up in 2 weeks virtual visit for BP check Continue valsartan 60 mg daily as prescribed   Patient has been counseled on age-appropriate routine health concerns for screening and prevention. These are reviewed and up-to-date. Referrals have been placed accordingly. Immunizations are up-to-date or declined.    Subjective:   Chief Complaint  Patient presents with   Hypertension   HPI Riley Wiggins 56 y.o. 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    Patient has been counseled on age-appropriate routine health concerns for screening and prevention. These are reviewed and up-to-date. Referrals have been placed accordingly. Immunizations are up-to-date or declined.     COLONOSCOPY: Positvie FOBT; Referred to GI for follow up. Possibly related to xarelto. Also may need endoscopy as he endorses chronic globus sensation in throat. Denies dysphagia with solids or liquids however.    HTN Blood pressure is elevated today.  Notes home blood pressure average 120s to 140s over 80s.  I will not make any adjustments with his blood pressure medication today and we will revisit blood pressure log via virtual visit in 2 weeks to determine if valsartan needs to be increased  BP Readings from Last 3 Encounters:  07/28/21 (!) 156/104  05/11/21 124/82  01/20/21 (!) 151/99     Review of Systems  Constitutional:  Negative for fever, malaise/fatigue and weight loss.  HENT: Negative.  Negative for nosebleeds.   Eyes: Negative.  Negative for blurred vision, double vision and photophobia.  Respiratory: Negative.  Negative for cough and shortness of breath.   Cardiovascular: Negative.  Negative for chest pain, palpitations and leg swelling.   Gastrointestinal: Negative.  Negative for heartburn, nausea and vomiting.  Musculoskeletal: Negative.  Negative for myalgias.  Neurological: Negative.  Negative for dizziness, focal weakness, seizures and headaches.  Psychiatric/Behavioral: Negative.  Negative for suicidal ideas.     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     Past Surgical History:  Procedure Laterality Date   HERNIA REPAIR     " when I was young "   NO PAST SURGERIES      Family History  Problem Relation Age of Onset   Diabetes Mother    Diabetes Father    Pancreatitis Neg Hx    Colon cancer Neg Hx    Stomach cancer Neg Hx     Social History Reviewed with no changes to be made today.   Outpatient Medications Prior to Visit  Medication Sig Dispense Refill   acetaminophen (TYLENOL) 500 MG tablet Take 1,000 mg by mouth every 6 (six) hours as needed for mild pain.     allopurinol (ZYLOPRIM) 100 MG tablet TAKE 1 TABLET (100 MG TOTAL) BY MOUTH DAILY. 90 tablet 1   Ascorbic Acid (VITAMIN C) 1000 MG tablet Take 1,000 mg by mouth daily.     atorvastatin (LIPITOR) 40 MG tablet Take 1 tablet (40 mg total) by mouth daily. 90 tablet 3   Blood Glucose Monitoring Suppl (TRUE METRIX METER) w/Device KIT Use to check blood  sugar TID. 1 kit 0   Continuous Blood Gluc Receiver (DEXCOM G6 RECEIVER) DEVI Check blood glucose level by fingerstick 3-4 times per day.  E11.65 1 each 0   Continuous Blood Gluc Sensor (DEXCOM G6 SENSOR) MISC Check blood glucose level by fingerstick 3-4 times per day.  E11.65 3 each 2   Continuous Blood Gluc Transmit (DEXCOM G6 TRANSMITTER) MISC Check blood glucose level by fingerstick 3-4 times per day.  E11.65 1 each 2   dapagliflozin propanediol (FARXIGA) 5 MG TABS  tablet Take 1 tablet (5 mg total) by mouth daily before breakfast. 30 tablet 2   Dulaglutide (TRULICITY) 3 JJ/8.8CZ SOPN Inject 3 mg as directed once a week. 2 mL 1   Garlic 6606 MG CAPS Take 1,200 mg by mouth daily.     glucose blood test strip USE AS DIRECTED TO CHECK BLOOD GLUCOSE TWICE DAILY 100 strip 12   Insulin Glargine (BASAGLAR KWIKPEN) 100 UNIT/ML Inject 14 Units into the skin 2 (two) times daily. 15 mL PRN   Insulin Pen Needle 31G X 5 MM MISC Use as instructed. Inject into the skin once daily. 100 each 5   metFORMIN (GLUCOPHAGE) 1000 MG tablet Take 1 tablet (1,000 mg total) by mouth 2 (two) times daily with a meal. 180 tablet 1   Multiple Vitamins-Minerals (MULTI FOR HIM 50+) TABS Take 1 tablet by mouth daily.     rivaroxaban (XARELTO) 20 MG TABS tablet TAKE 1 TABLET (20 MG TOTAL) BY MOUTH DAILY WITH SUPPER. 30 tablet 4   TRUEplus Lancets 28G MISC CHECK BLOOD GLUCOSE LEVEL BY FINGERSTICK TWICE PER DAY. E11.65. 100 each 1   valsartan (DIOVAN) 160 MG tablet Take 1 tablet (160 mg total) by mouth daily. 90 tablet 3   No facility-administered medications prior to visit.    No Known Allergies     Objective:    BP (!) 156/104   Pulse 96   Temp 98.1 F (36.7 C) (Oral)   Ht $R'6\' 1"'Pi$  (1.854 m)   Wt 287 lb (130.2 kg)   SpO2 96%   BMI 37.87 kg/m  Wt Readings from Last 3 Encounters:  07/28/21 287 lb (130.2 kg)  01/20/21 294 lb 6 oz (133.5 kg)  01/09/21 299 lb (135.6 kg)    Physical Exam Vitals and nursing note reviewed.  Constitutional:      Appearance: He is well-developed.  HENT:     Head: Normocephalic and atraumatic.  Cardiovascular:     Rate and Rhythm: Normal rate and regular rhythm.     Heart sounds: Normal heart sounds. No murmur heard.    No friction rub. No gallop.  Pulmonary:     Effort: Pulmonary effort is normal. No tachypnea or respiratory distress.     Breath sounds: Normal breath sounds. No decreased breath sounds, wheezing, rhonchi or rales.  Chest:      Chest wall: No tenderness.  Abdominal:     General: Bowel sounds are normal.     Palpations: Abdomen is soft.  Musculoskeletal:        General: Normal range of motion.     Cervical back: Normal range of motion.  Skin:    General: Skin is warm and dry.  Neurological:     Mental Status: He is alert and oriented to person, place, and time.     Coordination: Coordination normal.  Psychiatric:        Behavior: Behavior normal. Behavior is cooperative.        Thought Content: Thought content  normal.        Judgment: Judgment normal.          Patient has been counseled extensively about nutrition and exercise as well as the importance of adherence with medications and regular follow-up. The patient was given clear instructions to go to ER or return to medical center if symptoms don't improve, worsen or new problems develop. The patient verbalized understanding.   Follow-up: Return for SCHEDULE virtual on a tuesday with me in 2 weeks. Schedule lab visit for 3 weeks. Gildardo Pounds, FNP-BC [facility] and [facility] [place], [place]   07/28/2021, 10:50 AM

## 2021-08-03 ENCOUNTER — Other Ambulatory Visit: Payer: Self-pay

## 2021-08-07 ENCOUNTER — Other Ambulatory Visit: Payer: Self-pay

## 2021-08-07 ENCOUNTER — Other Ambulatory Visit: Payer: Self-pay | Admitting: Nurse Practitioner

## 2021-08-07 ENCOUNTER — Ambulatory Visit: Payer: Self-pay

## 2021-08-07 DIAGNOSIS — K55069 Acute infarction of intestine, part and extent unspecified: Secondary | ICD-10-CM

## 2021-08-07 MED ORDER — RIVAROXABAN 20 MG PO TABS
ORAL_TABLET | Freq: Every day | ORAL | 4 refills | Status: DC
  Filled 2021-08-07 (×2): qty 30, 30d supply, fill #0
  Filled 2021-09-03 – 2021-09-11 (×2): qty 30, 30d supply, fill #1
  Filled 2021-10-05 – 2021-10-10 (×3): qty 30, 30d supply, fill #2

## 2021-08-07 NOTE — Telephone Encounter (Signed)
Routing to pcp for review 

## 2021-08-07 NOTE — Telephone Encounter (Signed)
Summary: prescription question   Pt called saying Dr. Vella Kohler prescribed Riley Wiggins and he said the cost with insurance was 600.00 and he said he can not afford that.     [name]   CB#  [phone]     1 pill left Pt stated that his cardiologist does not want him to take coumadin. Pt stated his co pay is 600.00 on Xarelto. Pt asking for alternative, less expensive medication. Pt has 1 pill left. Pt with questions about his copay of diabetes medication. Transferred pt to his pharmacy.

## 2021-08-07 NOTE — Telephone Encounter (Signed)
Xarelto sent to [facility] pharmacy

## 2021-08-15 ENCOUNTER — Ambulatory Visit: Payer: Commercial Managed Care - HMO | Attending: Nurse Practitioner | Admitting: Nurse Practitioner

## 2021-08-15 DIAGNOSIS — E1165 Type 2 diabetes mellitus with hyperglycemia: Secondary | ICD-10-CM

## 2021-08-15 DIAGNOSIS — I10 Essential (primary) hypertension: Secondary | ICD-10-CM

## 2021-08-15 NOTE — Progress Notes (Signed)
No answer  LVM x3  9:50 10:06 Last call 2:20pm

## 2021-08-21 ENCOUNTER — Other Ambulatory Visit: Payer: Managed Care, Other (non HMO)

## 2021-08-23 ENCOUNTER — Telehealth: Payer: Self-pay | Admitting: *Deleted

## 2021-08-23 ENCOUNTER — Other Ambulatory Visit: Payer: Self-pay

## 2021-08-23 ENCOUNTER — Ambulatory Visit: Payer: Commercial Managed Care - HMO | Attending: Nurse Practitioner

## 2021-08-23 ENCOUNTER — Other Ambulatory Visit (INDEPENDENT_AMBULATORY_CARE_PROVIDER_SITE_OTHER): Payer: Commercial Managed Care - HMO

## 2021-08-23 ENCOUNTER — Encounter: Payer: Self-pay | Admitting: Physician Assistant

## 2021-08-23 ENCOUNTER — Ambulatory Visit (INDEPENDENT_AMBULATORY_CARE_PROVIDER_SITE_OTHER): Payer: Commercial Managed Care - HMO | Admitting: Physician Assistant

## 2021-08-23 VITALS — BP 150/70 | HR 88 | Ht 73.0 in | Wt 285.0 lb

## 2021-08-23 DIAGNOSIS — Z7901 Long term (current) use of anticoagulants: Secondary | ICD-10-CM | POA: Diagnosis not present

## 2021-08-23 DIAGNOSIS — R195 Other fecal abnormalities: Secondary | ICD-10-CM

## 2021-08-23 DIAGNOSIS — E1165 Type 2 diabetes mellitus with hyperglycemia: Secondary | ICD-10-CM

## 2021-08-23 LAB — CBC WITH DIFFERENTIAL/PLATELET
Basophils Absolute: 0 10*3/uL (ref 0.0–0.1)
Basophils Relative: 0.5 % (ref 0.0–3.0)
Eosinophils Absolute: 0.2 10*3/uL (ref 0.0–0.7)
Eosinophils Relative: 1.9 % (ref 0.0–5.0)
HCT: 45.4 % (ref 39.0–52.0)
Hemoglobin: 15.3 g/dL (ref 13.0–17.0)
Lymphocytes Relative: 30.6 % (ref 12.0–46.0)
Lymphs Abs: 2.8 10*3/uL (ref 0.7–4.0)
MCHC: 33.8 g/dL (ref 30.0–36.0)
MCV: 99.9 fl (ref 78.0–100.0)
Monocytes Absolute: 0.7 10*3/uL (ref 0.1–1.0)
Monocytes Relative: 7.9 % (ref 3.0–12.0)
Neutro Abs: 5.5 10*3/uL (ref 1.4–7.7)
Neutrophils Relative %: 59.1 % (ref 43.0–77.0)
Platelets: 219 10*3/uL (ref 150.0–400.0)
RBC: 4.54 Mil/uL (ref 4.22–5.81)
RDW: 13.3 % (ref 11.5–15.5)
WBC: 9.3 10*3/uL (ref 4.0–10.5)

## 2021-08-23 LAB — IBC + FERRITIN
Ferritin: 56.3 ng/mL (ref 22.0–322.0)
Iron: 69 ug/dL (ref 42–165)
Saturation Ratios: 16.1 % — ABNORMAL LOW (ref 20.0–50.0)
TIBC: 429.8 ug/dL (ref 250.0–450.0)
Transferrin: 307 mg/dL (ref 212.0–360.0)

## 2021-08-23 NOTE — Telephone Encounter (Signed)
[facility] Pre-operative Risk Assessment     Request for surgical clearance:     Endoscopy Procedure  What type of surgery is being performed?     Colonoscopy  When is this surgery scheduled?     7/31  What type of clearance is required ?   Pharmacy  Are there any medications that need to be held prior to surgery and how long? Xarelto 2 days   Practice name and name of physician performing surgery?      [facility]  Dr Franklin Cellar   What is your office phone and fax number?      Phone- [phone]  [phone]  Anesthesia type (None, local, MAC, general) ?       MAC

## 2021-08-23 NOTE — Progress Notes (Signed)
Agree with assessment and plan as outlined.  If he has an iron deficiency would recommend EGD to be added to the colonoscopy.  Thanks

## 2021-08-23 NOTE — Progress Notes (Signed)
Chief Complaint: + Hemoccult  HPI:    Riley Wiggins is a 56 year old African-American male, assigned to Dr. 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    05/05/2020 patient seen in clinic by me but at that point had recent thrombosis and had been started on Xarelto just a month prior.  We discussed colonoscopy at that time but we wanted to wait for 6 months in order to allow time for his Xarelto to work and to be able to hold it in time for appointment.    07/04/2021 fecal occult positive.    Today, patient explains that he has been told he has blood in his stool and that he needs a colonoscopy.  Describes that his cardiologist has kept him on the Xarelto and he is not having any problems with further blood clots over the past year.  He denies ever seeing any bright red blood or black tarry looking stools.  He feels fine otherwise with no dizziness, palpitations or shortness of breath.  He denies any GI complaints or concerns.    Denies fever, chills or weight loss.   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     Past Surgical History:  Procedure Laterality Date   HERNIA REPAIR     " when I was young "   NO PAST SURGERIES      Current Outpatient Medications  Medication Sig Dispense Refill   acetaminophen (TYLENOL) 500 MG tablet Take 1,000 mg by mouth every 6 (six) hours as needed for mild pain.     allopurinol (ZYLOPRIM) 100 MG tablet TAKE 1 TABLET (100 MG TOTAL) BY MOUTH DAILY. 90 tablet 1   Ascorbic Acid (VITAMIN C) 1000 MG tablet Take 1,000 mg by mouth daily.      atorvastatin (LIPITOR) 40 MG tablet Take 1 tablet (40 mg total) by mouth daily. 90 tablet 3   Blood Glucose Monitoring Suppl (TRUE METRIX METER) w/Device KIT Use to check blood sugar TID. 1 kit 0   dapagliflozin propanediol (FARXIGA) 5 MG TABS tablet Take 1 tablet (5 mg total) by mouth daily before breakfast. 30 tablet 2   Dulaglutide (TRULICITY) 3 GM/0.1UU SOPN Inject 3 mg as directed once a week. 2 mL 1   Garlic 7253 MG CAPS Take 1,200 mg by mouth daily.     glucose blood test strip USE AS DIRECTED TO CHECK BLOOD GLUCOSE TWICE DAILY 100 strip 12   Insulin Glargine (BASAGLAR KWIKPEN) 100 UNIT/ML Inject 14 Units into the skin 2 (two) times daily. 15 mL PRN   Insulin Pen Needle 31G X 5 MM MISC Use as instructed. Inject into the skin once daily. 100 each 5   Multiple Vitamins-Minerals (MULTI FOR HIM 50+) TABS Take 1 tablet by mouth daily.     rivaroxaban (XARELTO) 20 MG TABS tablet TAKE 1 TABLET (20 MG TOTAL) BY MOUTH DAILY WITH SUPPER. 30 tablet 4   TRUEplus Lancets 28G MISC CHECK BLOOD GLUCOSE LEVEL BY FINGERSTICK TWICE PER DAY. E11.65. 100 each 1   valsartan (DIOVAN) 160 MG tablet Take 1 tablet (160 mg total) by  mouth daily. 90 tablet 3   metFORMIN (GLUCOPHAGE) 1000 MG tablet Take 1 tablet (1,000 mg total) by mouth 2 (two) times daily with a meal. (Patient not taking: Reported on 08/23/2021) 180 tablet 1   No current facility-administered medications for this visit.    Allergies as of 08/23/2021   (No Known Allergies)    Family History  Problem Relation Age of Onset   Diabetes Mother    Diabetes Father    Pancreatitis Neg Hx    Colon cancer Neg Hx    Stomach cancer Neg Hx     Social History   Socioeconomic History   Marital status: Single    Spouse name: Not on file   Number of children: Not on file   Years of education: Not on file   Highest education level: Not on file  Occupational History   Not on file  Tobacco Use   Smoking status: Former    Types: Cigarettes    Smokeless tobacco: Never   Tobacco comments:    Quit smoking 5 years ago  Vaping Use   Vaping Use: Never used  Substance and Sexual Activity   Alcohol use: Not Currently    Comment: rare   Drug use: No   Sexual activity: Yes  Other Topics Concern   Not on file  Social History Narrative   Works at [employer]   Lives in [place] with girlfriend    Social Determinants of Radio broadcast assistant Strain: Not on file  Food Insecurity: Not on file  Transportation Needs: Not on file  Physical Activity: Not on file  Stress: Not on file  Social Connections: Not on file  Intimate Partner Violence: Not on file    Review of Systems:    Constitutional: No weight loss, fever or chills Cardiovascular: No chest pain Respiratory: No SOB  Gastrointestinal: See HPI and otherwise negative   Physical Exam:  Vital signs: BP (!) 150/70   Pulse 88   Ht _0  (1.854 m)   Wt 285 lb (129.3 kg)   BMI 37.60 kg/m    Constitutional:   Pleasant overweight AA male appears to be in NAD, Well developed, Well nourished, alert and cooperative Respiratory: Respirations even and unlabored. Lungs clear to auscultation bilaterally.   No wheezes, crackles, or rhonchi.  Cardiovascular: Normal S1, S2. No MRG. Regular rate and rhythm. No peripheral edema, cyanosis or pallor.  Gastrointestinal:  Soft, nondistended, nontender. No rebound or guarding. Normal bowel sounds. No appreciable masses or hepatomegaly. Psychiatric:  Demonstrates good judgement and reason without abnormal affect or behaviors.  RELEVANT LABS AND IMAGING: CBC    Component Value Date/Time   WBC 9.1 10/20/2020 1011   WBC 10.5 03/15/2020 0148   RBC 4.88 10/20/2020 1011   RBC 3.59 (L) 03/15/2020 0148   HGB 15.3 10/20/2020 1011   HCT 46.0 10/20/2020 1011   PLT 222 10/20/2020 1011   MCV 94 10/20/2020 1011   MCV 101 (H) 05/10/2014 1321   MCH 31.4 10/20/2020 1011   MCH 32.3 03/15/2020 0148   MCHC 33.3 10/20/2020 1011   MCHC 34.2  03/15/2020 0148   RDW 12.9 10/20/2020 1011   RDW 12.9 05/10/2014 1321   LYMPHSABS 2.5 10/20/2020 1011   LYMPHSABS 2.3 05/10/2014 1321   MONOABS 0.9 03/15/2020 0148   MONOABS 1.2 (H) 05/10/2014 1321   EOSABS 0.1 10/20/2020 1011   EOSABS 0.3 05/10/2014 1321   BASOSABS 0.1 10/20/2020 1011   BASOSABS 0 05/10/2014 1444  BASOSABS 0.1 05/10/2014 1321    CMP     Component Value Date/Time   NA 141 05/11/2021 0953   NA 136 05/10/2014 1321   K 4.1 05/11/2021 0953   K 3.8 05/10/2014 1321   CL 102 05/11/2021 0953   CL 96 (L) 05/10/2014 1321   CO2 20 05/11/2021 0953   CO2 29 05/10/2014 1321   GLUCOSE 189 (H) 05/11/2021 0953   GLUCOSE 169 (H) 03/14/2020 0456   GLUCOSE 145 (H) 05/10/2014 1321   BUN 12 05/11/2021 0953   BUN 13 05/10/2014 1321   CREATININE 0.91 05/11/2021 0953   CREATININE 0.76 05/10/2014 1321   CALCIUM 10.1 05/11/2021 0953   CALCIUM 10.3 05/10/2014 1321   PROT 7.2 05/11/2021 0953   ALBUMIN 4.6 05/11/2021 0953   AST 29 05/11/2021 0953   ALT 34 05/11/2021 0953   ALKPHOS 72 05/11/2021 0953   BILITOT 0.5 05/11/2021 0953   GFRNONAA 95 03/17/2020 0950   GFRNONAA >60 03/14/2020 0456   GFRNONAA >60 05/10/2014 1321   GFRAA 110 03/17/2020 0950   GFRAA >60 05/10/2014 1321    Assessment: 1.  Positive Hemoccult: 07/04/2021, patient has never had a colonoscopy; consider GI source of blood loss 2.  Chronic anticoagulation for previous thrombosis: On Xarelto  Plan: 1.  We will check a CBC and iron studies today as patient has had a recent positive Hemoccult and no one has checked these things. 2.  Scheduled patient for diagnostic colonoscopy in the [place] with Dr. Havery Moros.  Did provide the patient a detailed list of risks for the procedure and he agrees to proceed. Patient is appropriate for endoscopic procedure(s) in the ambulatory ([place]) setting.  3.  Patient was advised to hold his Xarelto for 2 days prior to procedure.  We will communicate with his prescribing physician to  ensure this is acceptable for him. 4.  Patient to follow in clinic per recommendations from Dr. Havery Moros after time of procedure.  Ellouise Newer, PA-C [facility] 08/23/2021, 9:33 AM  Cc: Gildardo Pounds, NP

## 2021-08-23 NOTE — Patient Instructions (Addendum)
Your provider has requested that you go to the basement level for lab work before leaving today. Press "B" on the elevator. The lab is located at the first door on the left as you exit the elevator.   You have been scheduled for a colonoscopy. Please follow written instructions given to you at your visit today.  Please pick up your prep supplies at the pharmacy within the next 1-3 days. If you use inhalers (even only as needed), please bring them with you on the day of your procedure.   You will be contacted by our office prior to your procedure for directions on holding your Xarelto   If you do not hear from our office 1 week prior to your scheduled procedure, please call [phone] to discuss.    Due to recent changes in healthcare laws, you may see the results of your imaging and laboratory studies on MyChart before your provider has had a chance to review them.  We understand that in some cases there may be results that are confusing or concerning to you. Not all laboratory results come back in the same time frame and the provider may be waiting for multiple results in order to interpret others.  Please give [place] 48 hours in order for your provider to thoroughly review all the results before contacting the office for clarification of your results.    If you are age 56 or older, your body mass index should be between 23-30. Your Body mass index is 37.6 kg/m. If this is out of the aforementioned range listed, please consider follow up with your Primary Care Provider.  If you are age 30 or younger, your body mass index should be between 19-25. Your Body mass index is 37.6 kg/m. If this is out of the aformentioned range listed, please consider follow up with your Primary Care Provider.   ________________________________________________________  The [facility] providers would like to encourage you to use [facility] to communicate with providers for non-urgent requests or questions.  Due to long hold  times on the telephone, sending your provider a message by [facility] may be a faster and more efficient way to get a response.  Please allow 48 business hours for a response.  Please remember that this is for non-urgent requests.  _______________________________________________________   Thank you for choosing [facility]  Lovett Calender

## 2021-08-24 LAB — BASIC METABOLIC PANEL
BUN/Creatinine Ratio: 16 (ref 9–20)
BUN: 14 mg/dL (ref 6–24)
CO2: 22 mmol/L (ref 20–29)
Calcium: 9.7 mg/dL (ref 8.7–10.2)
Chloride: 103 mmol/L (ref 96–106)
Creatinine, Ser: 0.9 mg/dL (ref 0.76–1.27)
Glucose: 124 mg/dL — ABNORMAL HIGH (ref 70–99)
Potassium: 4.3 mmol/L (ref 3.5–5.2)
Sodium: 139 mmol/L (ref 134–144)
eGFR: 100 mL/min/{1.73_m2} (ref >59)

## 2021-08-24 LAB — HEMOGLOBIN A1C
Est. average glucose Bld gHb Est-mCnc: 180 mg/dL
Hgb A1c MFr Bld: 7.9 % — ABNORMAL HIGH (ref 4.8–5.6)

## 2021-08-24 NOTE — Telephone Encounter (Signed)
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

## 2021-08-25 NOTE — Telephone Encounter (Signed)
Please advise [name], NP

## 2021-08-25 NOTE — Telephone Encounter (Signed)
Forwarding message to requesting provider so that they may reach out to appropriate provider for clearance.  If medical cardiac clearance is requested, please notify our office.  Emmaline Life, NP-C    08/25/2021, 8:27 AM [facility] [address] Office [phone] Fax [phone]

## 2021-08-27 NOTE — Telephone Encounter (Signed)
May hold xarelto 2 days prior to procedure (colonoscopy)

## 2021-08-28 NOTE — Telephone Encounter (Signed)
Patient informed to hold Xarelto 2 days prior to his procedure. Patient verbalized understanding.

## 2021-08-29 ENCOUNTER — Encounter: Payer: Self-pay | Admitting: Gastroenterology

## 2021-09-03 ENCOUNTER — Other Ambulatory Visit: Payer: Self-pay | Admitting: Family Medicine

## 2021-09-03 DIAGNOSIS — E1165 Type 2 diabetes mellitus with hyperglycemia: Secondary | ICD-10-CM

## 2021-09-04 ENCOUNTER — Other Ambulatory Visit: Payer: Self-pay | Admitting: Family Medicine

## 2021-09-04 ENCOUNTER — Encounter: Payer: Self-pay | Admitting: Gastroenterology

## 2021-09-04 ENCOUNTER — Ambulatory Visit (AMBULATORY_SURGERY_CENTER): Payer: Commercial Managed Care - HMO | Admitting: Gastroenterology

## 2021-09-04 ENCOUNTER — Other Ambulatory Visit: Payer: Self-pay

## 2021-09-04 VITALS — BP 156/96 | HR 79 | Temp 98.2°F | Resp 18 | Ht 73.0 in | Wt 285.0 lb

## 2021-09-04 DIAGNOSIS — K5730 Diverticulosis of large intestine without perforation or abscess without bleeding: Secondary | ICD-10-CM | POA: Diagnosis not present

## 2021-09-04 DIAGNOSIS — E1165 Type 2 diabetes mellitus with hyperglycemia: Secondary | ICD-10-CM

## 2021-09-04 DIAGNOSIS — R195 Other fecal abnormalities: Secondary | ICD-10-CM | POA: Diagnosis not present

## 2021-09-04 DIAGNOSIS — K648 Other hemorrhoids: Secondary | ICD-10-CM

## 2021-09-04 DIAGNOSIS — Z7901 Long term (current) use of anticoagulants: Secondary | ICD-10-CM | POA: Diagnosis not present

## 2021-09-04 DIAGNOSIS — D120 Benign neoplasm of cecum: Secondary | ICD-10-CM | POA: Diagnosis not present

## 2021-09-04 DIAGNOSIS — D122 Benign neoplasm of ascending colon: Secondary | ICD-10-CM | POA: Diagnosis not present

## 2021-09-04 MED ORDER — SODIUM CHLORIDE 0.9 % IV SOLN: 500.0000 mL | Freq: Once | INTRAVENOUS | Status: DC

## 2021-09-04 NOTE — Progress Notes (Signed)
Sedate, gd SR, tolerated procedure well, VSS, report to RN 

## 2021-09-04 NOTE — Progress Notes (Signed)
Vitals-DT  Pt's states no medical or surgical changes since previsit or office visit.  

## 2021-09-04 NOTE — Op Note (Signed)
[facility] Patient Name: Riley Wiggins Procedure Date: 09/04/2021 11:23 AM MRN: [id number] Endoscopist: Remo Lipps P. Havery Moros , MD Age: 56 Referring MD:  Date of Birth: [birth year] Gender: Male Account #: [id number] Procedure:                Colonoscopy Indications:              Heme positive stool in the setting of Xarelto use -                            first colonoscopy Medicines:                None Procedure:                Pre-Anesthesia Assessment:                           - Prior to the procedure, a History and Physical                            was performed, and patient medications and                            allergies were reviewed. The patient's tolerance of                            previous anesthesia was also reviewed. The risks                            and benefits of the procedure and the sedation                            options and risks were discussed with the patient.                            All questions were answered, and informed consent                            was obtained. Prior Anticoagulants: The patient has                            taken Xarelto (rivaroxaban), last dose was 2 days                            prior to procedure. ASA Grade Assessment: III - A                            patient with severe systemic disease. After                            reviewing the risks and benefits, the patient was                            deemed in satisfactory condition to undergo the  procedure.                           After obtaining informed consent, the colonoscope                            was passed under direct vision. Throughout the                            procedure, the patient's blood pressure, pulse, and                            oxygen saturations were monitored continuously. The                            CF [id number] #[id number] was introduced through the anus                            and  advanced to the the terminal ileum, with                            identification of the appendiceal orifice and IC                            valve. The colonoscopy was performed without                            difficulty. The patient tolerated the procedure                            well. The quality of the bowel preparation was                            adequate. The terminal ileum, ileocecal valve,                            appendiceal orifice, and rectum were photographed. Scope In: 11:28:30 AM Scope Out: 11:49:43 AM Scope Withdrawal Time: 0 hours 16 minutes 16 seconds  Total Procedure Duration: 0 hours 21 minutes 13 seconds  Findings:                 The perianal and digital rectal examinations were                            normal.                           The terminal ileum appeared normal.                           A diminutive polyp was found in the cecum. The                            polyp was sessile. The polyp was removed with a  cold snare. Resection and retrieval were complete.                           Multiple medium-mouthed diverticula were found in                            the entire colon.                           Internal hemorrhoids were found during                            retroflexion. The hemorrhoids were small.                           The exam was otherwise without abnormality. Complications:            No immediate complications. Estimated blood loss:                            Minimal. Estimated Blood Loss:     Estimated blood loss was minimal. Impression:               - The examined portion of the ileum was normal.                           - One diminutive polyp in the cecum, removed with a                            cold snare. Resected and retrieved.                           - Diverticulosis in the entire examined colon.                           - Internal hemorrhoids.                           - The  examination was otherwise normal.                           No concerning pathology noted on this exam, could                            have had heme positive stool from hemrorhoids in                            the setting of Xarelto. No anemia, MCV normal, iron                            studies look okay. Recommendation:           - Patient has a contact number available for                            emergencies. The signs and symptoms of potential  delayed complications were discussed with the                            patient. Return to normal activities tomorrow.                            Written discharge instructions were provided to the                            patient.                           - Resume previous diet.                           - Continue present medications.                           - Resume Xarelto tomorrow AM                           - Await pathology results. Remo Lipps P. [name], MD 09/04/2021 11:55:13 AM This report has been signed electronically.

## 2021-09-04 NOTE — Patient Instructions (Signed)
Handouts on polyps, hemorrhoids, and diverticulosis given to patient. Await pathology results. Repeat colonoscopy for screening will be determined based off of pathology results. Resume Xarleto at previous dose tomorrow morning 09/05/21  YOU HAD AN ENDOSCOPIC PROCEDURE TODAY AT [facility]:   Refer to the procedure report that was given to you for any specific questions about what was found during the examination.  If the procedure report does not answer your questions, please call your gastroenterologist to clarify.  If you requested that your care partner not be given the details of your procedure findings, then the procedure report has been included in a sealed envelope for you to review at your convenience later.  YOU SHOULD EXPECT: Some feelings of bloating in the abdomen. Passage of more gas than usual.  Walking can help get rid of the air that was put into your GI tract during the procedure and reduce the bloating. If you had a lower endoscopy (such as a colonoscopy or flexible sigmoidoscopy) you may notice spotting of blood in your stool or on the toilet paper. If you underwent a bowel prep for your procedure, you may not have a normal bowel movement for a few days.  Please Note:  You might notice some irritation and congestion in your nose or some drainage.  This is from the oxygen used during your procedure.  There is no need for concern and it should clear up in a day or so.  SYMPTOMS TO REPORT IMMEDIATELY:  Following lower endoscopy (colonoscopy or flexible sigmoidoscopy):  Excessive amounts of blood in the stool  Significant tenderness or worsening of abdominal pains  Swelling of the abdomen that is new, acute  Fever of 100F or higher   For urgent or emergent issues, a gastroenterologist can be reached at any hour by calling [phone]. Do not use MyChart messaging for urgent concerns.    DIET:  We do recommend a small meal at first, but then you may proceed  to your regular diet.  Drink plenty of fluids but you should avoid alcoholic beverages for 24 hours.  ACTIVITY:  You should plan to take it easy for the rest of today and you should NOT DRIVE or use heavy machinery until tomorrow (because of the sedation medicines used during the test).    FOLLOW UP: Our staff will call the number listed on your records the next business day following your procedure.  We will call around 7:15- 8:00 am to check on you and address any questions or concerns that you may have regarding the information given to you following your procedure. If we do not reach you, we will leave a message.  If you develop any symptoms (ie: fever, flu-like symptoms, shortness of breath, cough etc.) before then, please call [phone].  If you test positive for Covid 19 in the 2 weeks post procedure, please call and report this information to [place].    If any biopsies were taken you will be contacted by phone or by letter within the next 1-3 weeks.  Please call us at [phone] if you have not heard about the biopsies in 3 weeks.    SIGNATURES/CONFIDENTIALITY: You and/or your care partner have signed paperwork which will be entered into your electronic medical record.  These signatures attest to the fact that that the information above on your After Visit Summary has been reviewed and is understood.  Full responsibility of the confidentiality of this discharge information lies with you and/or your care-partner.

## 2021-09-04 NOTE — Progress Notes (Signed)
History and Physical Interval Note: Patient seen 08/23/21 - see details of that note for full history. No interval changes. Heme positive stool in the setting of Xarelto use - no anemia, iron studies okay. No prior colonoscopy. He is here for his first exam today. He would like to do it awake if possible without anesthesia, but okay to give anesthesia if needed if he cannot tolerate it. He has a ride home. He wishes to proceed.   09/04/2021 11:25 AM  Riley Wiggins  has presented today for endoscopic procedure(s), with the diagnosis of  Encounter Diagnoses  Name Primary?   Heme positive stool Yes   Chronic anticoagulation   .  The various methods of evaluation and treatment have been discussed with the patient and/or family. After consideration of risks, benefits and other options for treatment, the patient has consented to  the endoscopic procedure(s).   The patient's history has been reviewed, patient examined, no change in status, stable for surgery.  I have reviewed the patient's chart and labs.  Questions were answered to the patient's satisfaction.    Jolly Mango, MD [facility]

## 2021-09-04 NOTE — Progress Notes (Signed)
Called to room to assist during endoscopic procedure.  Patient ID and intended procedure confirmed with present staff. Received instructions for my participation in the procedure from the performing physician.  

## 2021-09-05 ENCOUNTER — Telehealth: Payer: Self-pay

## 2021-09-05 MED ORDER — TRULICITY 3 MG/0.5ML ~~LOC~~ SOAJ: 3.0000 mg | SUBCUTANEOUS | 1 refills | Status: DC

## 2021-09-05 NOTE — Telephone Encounter (Signed)
  Follow up Call-     09/04/2021   10:46 AM 09/04/2021   10:31 AM  Call back number  Post procedure Call Back phone  # [phone]   Permission to leave phone message  Yes     Patient questions:  Do you have a fever, pain , or abdominal swelling? No. Pain Score  0 *  Have you tolerated food without any problems? Yes.    Have you been able to return to your normal activities? Yes.    Do you have any questions about your discharge instructions: Diet   No. Medications  No. Follow up visit  No.  Do you have questions or concerns about your Care? No.  Actions: * If pain score is 4 or above: No action needed, pain <4.

## 2021-09-06 ENCOUNTER — Other Ambulatory Visit: Payer: Self-pay

## 2021-09-06 MED ORDER — DAPAGLIFLOZIN PROPANEDIOL 5 MG PO TABS
5.0000 mg | ORAL_TABLET | Freq: Every day | ORAL | 2 refills | Status: DC
  Filled 2021-09-06: qty 30, 30d supply, fill #0
  Filled 2021-10-13: qty 30, 30d supply, fill #1
  Filled 2021-11-07: qty 30, 30d supply, fill #2

## 2021-09-06 MED ORDER — TRULICITY 3 MG/0.5ML ~~LOC~~ SOAJ
3.0000 mg | SUBCUTANEOUS | 1 refills | Status: DC
  Filled 2021-09-06: qty 2, 28d supply, fill #0

## 2021-09-06 NOTE — Telephone Encounter (Signed)
Requested medication (s) are due for refill today: yes  Requested medication (s) are on the active medication list: yes  Last refill:  09/05/21 and 05/11/21  Future visit scheduled:yes  Notes to clinic:  Unable to refill per protocol, last refill by provider 02/12/82 for Trulicity, possible duplicate. Dewaine Oats was last refilled 05/11/21, request has changed from previous prescription. Routing for review.     Requested Prescriptions  Pending Prescriptions Disp Refills   dapagliflozin propanediol (FARXIGA) 5 MG TABS tablet 30 tablet 2    Sig: Take 1 tablet (5 mg total) by mouth daily before breakfast.     Endocrinology:  Diabetes - SGLT2 Inhibitors Passed - 09/04/2021  4:31 PM      Passed - Cr in normal range and within 360 days    Creatinine  Date Value Ref Range Status  05/10/2014 0.76 mg/dL Final    Comment:    0.61-1.24 NOTE: New Reference Range  04/13/14    Creatinine, Ser  Date Value Ref Range Status  08/23/2021 0.90 0.76 - 1.27 mg/dL Final         Passed - HBA1C is between 0 and 7.9 and within 180 days    HbA1c, POC (controlled diabetic range)  Date Value Ref Range Status  05/11/2021 8.4 (A) 0.0 - 7.0 % Final   Hgb A1c MFr Bld  Date Value Ref Range Status  08/23/2021 7.9 (H) 4.8 - 5.6 % Final    Comment:             Prediabetes: 5.7 - 6.4          Diabetes: >6.4          Glycemic control for adults with diabetes: <7.0          Passed - eGFR in normal range and within 360 days    EGFR (African American)  Date Value Ref Range Status  05/10/2014 >60  Final   GFR calc Af Amer  Date Value Ref Range Status  03/17/2020 110 >59 mL/min/1.73 Final    Comment:    **In accordance with recommendations from the NKF-ASN Task force,**   [facility] is in the process of updating its eGFR calculation to the   2021 CKD-EPI creatinine equation that estimates kidney function   without a race variable.    EGFR (Non-African Amer.)  Date Value Ref Range Status  05/10/2014 >60  Final     Comment:    eGFR values <72mL/min/1.73 m2 may be an indication of chronic kidney disease (CKD). Calculated eGFR is useful in patients with stable renal function. The eGFR calculation will not be reliable in acutely ill patients when serum creatinine is changing rapidly. It is not useful in patients on dialysis. The eGFR calculation may not be applicable to patients at the low and high extremes of body sizes, pregnant women, and vegetarians.    GFR, Estimated  Date Value Ref Range Status  03/14/2020 >60 >60 mL/min Final    Comment:    (NOTE) Calculated using the CKD-EPI Creatinine Equation (2021)    GFR calc non Af Amer  Date Value Ref Range Status  03/17/2020 95 >59 mL/min/1.73 Final   eGFR  Date Value Ref Range Status  08/23/2021 100 >59 mL/min/1.73 Final         Passed - Valid encounter within last 6 months    Recent Outpatient Visits           1 month ago Primary hypertension   [place],  Vernia Buff, NP   3 months ago Type 2 diabetes mellitus with hyperglycemia, without long-term current use of insulin [facility])   [place], Jarome Matin, RPH-CPP   6 months ago Essential hypertension   [place], Annie Main L, RPH-CPP   7 months ago Type 2 diabetes mellitus with hyperglycemia, without long-term current use of insulin [facility])   [place], [place] W, NP   9 months ago Type 2 diabetes mellitus with hyperglycemia, without long-term current use of insulin [facility])   [place], RPH-CPP       Future Appointments             In 1 week Werner Lean, MD [place] [facility], [facility]             Dulaglutide (TRULICITY) 3 GP/4.9IY SOPN 2 mL 1    Sig: Inject 3 mg as directed once a week.     Endocrinology:  Diabetes - GLP-1 Receptor  Agonists Passed - 09/04/2021  4:31 PM      Passed - HBA1C is between 0 and 7.9 and within 180 days    HbA1c, POC (controlled diabetic range)  Date Value Ref Range Status  05/11/2021 8.4 (A) 0.0 - 7.0 % Final   Hgb A1c MFr Bld  Date Value Ref Range Status  08/23/2021 7.9 (H) 4.8 - 5.6 % Final    Comment:             Prediabetes: 5.7 - 6.4          Diabetes: >6.4          Glycemic control for adults with diabetes: <7.0          Passed - Valid encounter within last 6 months    Recent Outpatient Visits           1 month ago Primary hypertension   [place], [place] W, NP   3 months ago Type 2 diabetes mellitus with hyperglycemia, without long-term current use of insulin [facility])   [place], Annie Main L, RPH-CPP   6 months ago Essential hypertension   [place], Annie Main L, RPH-CPP   7 months ago Type 2 diabetes mellitus with hyperglycemia, without long-term current use of insulin [facility])   [place], [place] W, NP   9 months ago Type 2 diabetes mellitus with hyperglycemia, without long-term current use of insulin [facility])   [place], RPH-CPP       Future Appointments             In 1 week [place] Sells, Terisa Starr, MD [place], [facility]

## 2021-09-08 ENCOUNTER — Other Ambulatory Visit: Payer: Self-pay

## 2021-09-11 ENCOUNTER — Other Ambulatory Visit: Payer: Self-pay

## 2021-09-12 ENCOUNTER — Other Ambulatory Visit: Payer: Self-pay

## 2021-09-12 NOTE — Progress Notes (Unsigned)
Cardiology Office Note:    Date:  09/13/2021   ID:  Riley Wiggins, DOB 1965/06/02, MRN [id number]  PCP:  Gildardo Pounds, NP   [place]  Cardiologist:  Werner Lean, MD  Advanced Practice Provider:  No care team member to display Electrophysiologist:  None      CC: Follow up aortic ectasia  History of Present Illness:    Darol Cush is a 56 y.o. 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  Distant smoker. 2022: Mild aortic dilation, No AF, no LV thrombus.  Patient notes that he is doing well.   Since last visit notes that he fishes and does chores around the heart.  Is always moving and done something . There are no interval hospital/ED visit.    No chest pain or pressure .  No SOB/DOE and no PND/Orthopnea.  No weight gain or leg swelling.  No palpitations or syncope.  Ambulatory blood pressure 128/73 today.  He checks every morning and its normal.  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     Past Surgical History:  Procedure Laterality Date   HERNIA REPAIR     " when I was young "    Current Medications: Current Meds  Medication Sig   acetaminophen (TYLENOL) 500 MG tablet Take 1,000 mg by mouth every 6 (six) hours as needed for mild pain.   allopurinol (ZYLOPRIM) 100 MG tablet TAKE 1 TABLET (100 MG TOTAL) BY MOUTH DAILY.   Ascorbic Acid (VITAMIN C) 1000 MG tablet Take 1,000 mg by mouth daily.   atorvastatin (LIPITOR) 40 MG tablet Take 1 tablet (40 mg total) by mouth daily.   Blood Glucose Monitoring Suppl (TRUE METRIX METER) w/Device KIT Use to check blood sugar TID.   dapagliflozin propanediol (FARXIGA) 5 MG TABS tablet Take 1  tablet (5 mg total) by mouth daily before breakfast.   Dulaglutide (TRULICITY) 3 JJ/9.4RD SOPN Inject 3 mg as directed once a week.   Garlic 4081 MG CAPS Take 1,200 mg by mouth daily.   glucose blood test strip USE AS DIRECTED TO CHECK BLOOD GLUCOSE TWICE DAILY   Insulin Glargine (BASAGLAR KWIKPEN) 100 UNIT/ML Inject 14 Units into the skin 2 (two) times daily.   Insulin Pen Needle 31G X 5 MM MISC Use as instructed. Inject into the skin once daily.   Multiple Vitamins-Minerals (MULTI FOR HIM 50+) TABS Take 1 tablet by mouth daily.   rivaroxaban (XARELTO) 20 MG TABS tablet TAKE 1 TABLET (20 MG TOTAL) BY MOUTH DAILY WITH SUPPER.   TRUEplus Lancets 28G MISC CHECK BLOOD GLUCOSE LEVEL BY FINGERSTICK TWICE PER DAY. E11.65.   valsartan (DIOVAN) 160 MG tablet Take 1 tablet (160 mg total) by mouth daily.     Allergies:   No known allergies   Social History   Socioeconomic History   Marital status: Single    Spouse name: Not on file   Number of children: Not on file   Years of education: Not on file   Highest education level: Not on file  Occupational History   Not on file  Tobacco Use   Smoking status: Former    Types:  Cigarettes   Smokeless tobacco: Never   Tobacco comments:    Quit smoking 5 years ago  Vaping Use   Vaping Use: Never used  Substance and Sexual Activity   Alcohol use: Not Currently    Comment: rare   Drug use: No   Sexual activity: Yes  Other Topics Concern   Not on file  Social History Narrative   Works at truck[employer]   Lives in [place] with girlfriend    Social Determinants of Radio broadcast assistant Strain: Not on file  Food Insecurity: Not on file  Transportation Needs: Not on file  Physical Activity: Not on file  Stress: Not on file  Social Connections: Not on file     Family History: The patient's family history includes Diabetes in his father and mother. There is no history of Pancreatitis, Colon cancer, or Stomach cancer.  ROS:   Please  see the history of present illness.     All other systems reviewed and are negative.  EKGs/Labs/Other Studies Reviewed:    The following studies were reviewed today:  EKG:  09/13/21: SR LVH rate 86 04/14/20:  SR 82 Baseline Artifact Noted   ECHO COMPLETE WITH IMAGING ENHANCING AGENT 02/02/2021 1. Left ventricular ejection fraction, by estimation, is 55 to 60%. The left ventricle has normal function. The left ventricle has no regional wall motion abnormalities. There is mild asymmetric left ventricular hypertrophy of the basal-septal segment. Left ventricular diastolic parameters were normal. 2. Right ventricular systolic function is normal. The right ventricular size is normal. Tricuspid regurgitation signal is inadequate for assessing PA pressure. 3. The mitral valve is grossly normal. Trivial mitral valve regurgitation. No evidence of mitral stenosis. 4. The aortic valve is tricuspid. There is mild calcification of the aortic valve. Aortic valve regurgitation is not visualized. Aortic valve sclerosis is present, with no evidence of aortic valve stenosis. 5. Aortic dilatation noted. There is mild dilatation of the aortic root, measuring 40 mm. There is mild dilatation of the ascending aorta, measuring 41 mm.    LONG TERM MONITOR (3-7 DAYS) INTERPRETATION 01/27/2021  Narrative  Patient had a minimum heart rate of 41 bpm, maximum heart rate of 152 bpm, and average heart rate of 95 bpm.  Predominant underlying rhythm was sinus rhythm.  One run of SVT 4 beats at longest with a max rate of 132 bpm at fastest.  Isolated PACs were rare (<1.0%).  Isolated PVCs were rare (<1.0%).  Nocturnal Mobitz Type I heart block noted.  Triggered and diary events associated with sinus rhythm or rare PVCs.  No malignant arrhythmias.    Cardiac CT: Date:04/19/2007 Results: Findings:  Ascending aorta above sinotubular junction is mildly dilated at 3.5 cm in  diameter.  The descending aorta at the  same level is 2.2 cm in diameter.   The left atrium has normal morphology. The aortic valve is tricuspid.   Normal right dominant coronary anatomy.  No coronary calcification.   Left Main: No stenosis.  Left Anterior Descending: No stenosis.  Left Circumflex: No stenosis.  Right Coronary Artery: No stenosis.   Impression:  1) No significant coronary artery disease.  2) No coronary calcification.  3)         See additional report for lung fields.      Recent Labs: 05/11/2021: ALT 34 08/23/2021: BUN 14; Creatinine, Ser 0.90; Hemoglobin 15.3; Platelets 219.0; Potassium 4.3; Sodium 139  Recent Lipid Panel    Component Value Date/Time   CHOL 159 01/20/2021  1211   TRIG 146 01/20/2021 1211   HDL 50 01/20/2021 1211   CHOLHDL 3.2 01/20/2021 1211   CHOLHDL 4.4 08/03/2019 2005   VLDL UNABLE TO CALCULATE IF TRIGLYCERIDE OVER 400 mg/dL 08/03/2019 2005   LDLCALC 84 01/20/2021 1211   LDLDIRECT 57.0 08/03/2019 2005    Physical Exam:    VS:  BP 130/78   Pulse 86   Ht $R'6\' 1"'vj$  (1.854 m)   Wt 286 lb (129.7 kg)   SpO2 96%   BMI 37.73 kg/m     Repeat BP left arm Large cuff 130/78  Wt Readings from Last 3 Encounters:  09/13/21 286 lb (129.7 kg)  09/04/21 285 lb (129.3 kg)  08/23/21 285 lb (129.3 kg)    Gen: no distress, Morbid obesity   Neck: No JVD Ears: L ear Frank Sign Cardiac: No Rubs or Gallops, no murmur, RRR +2 radial pulses Respiratory: Clear to auscultation bilaterally, normal effort, normal  respiratory rate GI: Soft, nontender, non-distended  MS: No  edema;  moves all extremities Integument: Skin feels warm Neuro:  At time of evaluation, alert and oriented to person/place/time/situation  Psych: Normal affect, patient feels well  ASSESSMENT:    1. Aortic atherosclerosis ([place])   2. Hyperlipidemia associated with type 2 diabetes mellitus ([place])   3. Diabetes mellitus with coincident hypertension ([place])   4. 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  Concerns regarding medicines are outlined above.  Orders Placed This Encounter  Procedures   ALT   Lipid panel   EKG 12-Lead   ECHOCARDIOGRAM COMPLETE    No orders of the defined types were placed in this encounter.    Patient Instructions  Medication Instructions:  Your physician recommends that you continue on your current medications as directed. Please refer to the Current Medication list given to you today.  *If you need a refill on your cardiac medications before your next appointment, please call your pharmacy*   Lab Work: TODAY: FLP, ALT  If you have labs (blood work) drawn today and your tests are completely normal, you will receive your results only by: [place] (if you have MyChart) OR A paper copy in the mail If you have any lab test that is abnormal or we need to change your treatment, we will call you to review the results.   Testing/Procedures: DEC 2023- Your physician has requested that you have an echocardiogram. Echocardiography is a painless test that uses sound waves to create images of your heart. It provides your doctor with information about the size and shape of your heart and how well your heart's chambers and valves are working. This procedure takes approximately one hour. There are no restrictions for this procedure.    Follow-Up: At [facility], you and your  health needs are our priority.  As part of our continuing mission to provide you with exceptional heart care, we  have created designated Provider Care Teams.  These Care Teams include your primary Cardiologist (physician) and Advanced Practice Providers (APPs -  Physician Assistants and Nurse Practitioners) who all work together to provide you with the care you need, when you need it.   Your next appointment:   1 year(s)  The format for your next appointment:   In Person  Provider:   Melina Copa, PA-C, Ermalinda Barrios, PA-C, or Christen Bame, NP     Then, Werner Lean, MD will plan to see you again in 2 year(s).     Important Information About Sugar         Signed, Werner Lean, MD  09/13/2021 9:31 AM    [facility]

## 2021-09-13 ENCOUNTER — Ambulatory Visit (INDEPENDENT_AMBULATORY_CARE_PROVIDER_SITE_OTHER): Payer: Commercial Managed Care - HMO | Admitting: Internal Medicine

## 2021-09-13 ENCOUNTER — Encounter: Payer: Self-pay | Admitting: Internal Medicine

## 2021-09-13 VITALS — BP 130/78 | HR 86 | Ht 73.0 in | Wt 286.0 lb

## 2021-09-13 DIAGNOSIS — E119 Type 2 diabetes mellitus without complications: Secondary | ICD-10-CM

## 2021-09-13 DIAGNOSIS — E1169 Type 2 diabetes mellitus with other specified complication: Secondary | ICD-10-CM

## 2021-09-13 DIAGNOSIS — I700 Atherosclerosis of aorta: Secondary | ICD-10-CM

## 2021-09-13 DIAGNOSIS — I77810 Thoracic aortic ectasia: Secondary | ICD-10-CM

## 2021-09-13 DIAGNOSIS — E785 Hyperlipidemia, unspecified: Secondary | ICD-10-CM

## 2021-09-13 DIAGNOSIS — I10 Essential (primary) hypertension: Secondary | ICD-10-CM

## 2021-09-13 LAB — LIPID PANEL
Chol/HDL Ratio: 4 ratio (ref 0.0–5.0)
Cholesterol, Total: 212 mg/dL — ABNORMAL HIGH (ref 100–199)
HDL: 53 mg/dL (ref >39)
LDL Chol Calc (NIH): 90 mg/dL (ref 0–99)
Triglycerides: 424 mg/dL — ABNORMAL HIGH (ref 0–149)
VLDL Cholesterol Cal: 69 mg/dL — ABNORMAL HIGH (ref 5–40)

## 2021-09-13 LAB — ALT: ALT: 40 IU/L (ref 0–44)

## 2021-09-13 NOTE — Patient Instructions (Signed)
Medication Instructions:  Your physician recommends that you continue on your current medications as directed. Please refer to the Current Medication list given to you today.  *If you need a refill on your cardiac medications before your next appointment, please call your pharmacy*   Lab Work: TODAY: FLP, ALT  If you have labs (blood work) drawn today and your tests are completely normal, you will receive your results only by: [place] (if you have MyChart) OR A paper copy in the mail If you have any lab test that is abnormal or we need to change your treatment, we will call you to review the results.   Testing/Procedures: DEC 2023- Your physician has requested that you have an echocardiogram. Echocardiography is a painless test that uses sound waves to create images of your heart. It provides your doctor with information about the size and shape of your heart and how well your heart's chambers and valves are working. This procedure takes approximately one hour. There are no restrictions for this procedure.    Follow-Up: At [facility], you and your health needs are our priority.  As part of our continuing mission to provide you with exceptional heart care, we have created designated Provider Care Teams.  These Care Teams include your primary Cardiologist (physician) and Advanced Practice Providers (APPs -  Physician Assistants and Nurse Practitioners) who all work together to provide you with the care you need, when you need it.   Your next appointment:   1 year(s)  The format for your next appointment:   In Person  Provider:   Melina Copa, PA-C, Ermalinda Barrios, PA-C, or Christen Bame, NP     Then, Werner Lean, MD will plan to see you again in 2 year(s).     Important Information About Sugar

## 2021-09-15 ENCOUNTER — Telehealth: Payer: Self-pay | Admitting: Internal Medicine

## 2021-09-15 DIAGNOSIS — E785 Hyperlipidemia, unspecified: Secondary | ICD-10-CM

## 2021-09-15 DIAGNOSIS — Z79899 Other long term (current) drug therapy: Secondary | ICD-10-CM

## 2021-09-15 MED ORDER — ATORVASTATIN CALCIUM 80 MG PO TABS: 80.0000 mg | ORAL_TABLET | Freq: Every day | ORAL | 3 refills | Status: DC

## 2021-09-15 NOTE — Telephone Encounter (Signed)
Follow Up:      Patient was returning a call from today. He was not sure who called him.

## 2021-09-15 NOTE — Telephone Encounter (Signed)
Spoke with pt and advised of lab results and recommendations per Dr Milinda Antis.  Pt verbalizes understanding and agrees with current plan.  Rx sent to [place] as requested and lab orders placed for 01/05/2022. _____________________________________________________________________________________ Results: LDL above goal Plan: Atorvastatin to 80 mg, monitor for myalgias, lipids and ALT in three months   Werner Lean, MD

## 2021-10-05 ENCOUNTER — Other Ambulatory Visit: Payer: Self-pay

## 2021-10-06 ENCOUNTER — Encounter (HOSPITAL_COMMUNITY): Payer: Self-pay

## 2021-10-06 ENCOUNTER — Other Ambulatory Visit: Payer: Self-pay

## 2021-10-06 ENCOUNTER — Emergency Department (HOSPITAL_COMMUNITY): Payer: Commercial Managed Care - HMO

## 2021-10-06 ENCOUNTER — Inpatient Hospital Stay (HOSPITAL_COMMUNITY): Payer: Commercial Managed Care - HMO

## 2021-10-06 ENCOUNTER — Inpatient Hospital Stay (HOSPITAL_COMMUNITY)
Admission: EM | Admit: 2021-10-06 | Discharge: 2021-10-10 | DRG: 439 | Disposition: A | Discharge status: Home / Self Care | Payer: Commercial Managed Care - HMO | Attending: Family Medicine | Admitting: Family Medicine

## 2021-10-06 DIAGNOSIS — K551 Chronic vascular disorders of intestine: Secondary | ICD-10-CM | POA: Diagnosis present

## 2021-10-06 DIAGNOSIS — R6510 Systemic inflammatory response syndrome (SIRS) of non-infectious origin without acute organ dysfunction: Secondary | ICD-10-CM | POA: Diagnosis present

## 2021-10-06 DIAGNOSIS — K859 Acute pancreatitis without necrosis or infection, unspecified: Secondary | ICD-10-CM | POA: Diagnosis not present

## 2021-10-06 DIAGNOSIS — Z79899 Other long term (current) drug therapy: Secondary | ICD-10-CM | POA: Diagnosis not present

## 2021-10-06 DIAGNOSIS — K55069 Acute infarction of intestine, part and extent unspecified: Secondary | ICD-10-CM | POA: Diagnosis not present

## 2021-10-06 DIAGNOSIS — F1010 Alcohol abuse, uncomplicated: Secondary | ICD-10-CM | POA: Diagnosis present

## 2021-10-06 DIAGNOSIS — Z7901 Long term (current) use of anticoagulants: Secondary | ICD-10-CM | POA: Diagnosis not present

## 2021-10-06 DIAGNOSIS — Z7985 Long-term (current) use of injectable non-insulin antidiabetic drugs: Secondary | ICD-10-CM

## 2021-10-06 DIAGNOSIS — E1169 Type 2 diabetes mellitus with other specified complication: Secondary | ICD-10-CM | POA: Diagnosis present

## 2021-10-06 DIAGNOSIS — K760 Fatty (change of) liver, not elsewhere classified: Secondary | ICD-10-CM | POA: Diagnosis present

## 2021-10-06 DIAGNOSIS — Z87891 Personal history of nicotine dependence: Secondary | ICD-10-CM | POA: Diagnosis not present

## 2021-10-06 DIAGNOSIS — Z833 Family history of diabetes mellitus: Secondary | ICD-10-CM | POA: Diagnosis not present

## 2021-10-06 DIAGNOSIS — E785 Hyperlipidemia, unspecified: Secondary | ICD-10-CM | POA: Diagnosis present

## 2021-10-06 DIAGNOSIS — I10 Essential (primary) hypertension: Secondary | ICD-10-CM | POA: Diagnosis present

## 2021-10-06 DIAGNOSIS — E669 Obesity, unspecified: Secondary | ICD-10-CM | POA: Diagnosis present

## 2021-10-06 DIAGNOSIS — Z86718 Personal history of other venous thrombosis and embolism: Secondary | ICD-10-CM

## 2021-10-06 DIAGNOSIS — E1165 Type 2 diabetes mellitus with hyperglycemia: Secondary | ICD-10-CM | POA: Diagnosis present

## 2021-10-06 DIAGNOSIS — E871 Hypo-osmolality and hyponatremia: Secondary | ICD-10-CM | POA: Diagnosis present

## 2021-10-06 DIAGNOSIS — E66812 Obesity, class 2: Secondary | ICD-10-CM

## 2021-10-06 DIAGNOSIS — I160 Hypertensive urgency: Secondary | ICD-10-CM | POA: Diagnosis present

## 2021-10-06 DIAGNOSIS — E119 Type 2 diabetes mellitus without complications: Secondary | ICD-10-CM

## 2021-10-06 DIAGNOSIS — Z794 Long term (current) use of insulin: Secondary | ICD-10-CM

## 2021-10-06 DIAGNOSIS — Z6836 Body mass index (BMI) 36.0-36.9, adult: Secondary | ICD-10-CM | POA: Diagnosis not present

## 2021-10-06 LAB — COMPREHENSIVE METABOLIC PANEL
ALT: 41 U/L (ref 0–44)
AST: 26 U/L (ref 15–41)
Albumin: 2.8 g/dL — ABNORMAL LOW (ref 3.5–5.0)
Alkaline Phosphatase: 41 U/L (ref 38–126)
Anion gap: 12 (ref 5–15)
BUN: 10 mg/dL (ref 6–20)
CO2: 17 mmol/L — ABNORMAL LOW (ref 22–32)
Calcium: 8.2 mg/dL — ABNORMAL LOW (ref 8.9–10.3)
Chloride: 105 mmol/L (ref 98–111)
Creatinine, Ser: 0.6 mg/dL — ABNORMAL LOW (ref 0.61–1.24)
GFR, Estimated: >60 mL/min (ref >60)
Glucose, Bld: 194 mg/dL — ABNORMAL HIGH (ref 70–99)
Potassium: 4 mmol/L (ref 3.5–5.1)
Sodium: 134 mmol/L — ABNORMAL LOW (ref 135–145)
Total Bilirubin: 1.4 mg/dL — ABNORMAL HIGH (ref 0.3–1.2)
Total Protein: 5.2 g/dL — ABNORMAL LOW (ref 6.5–8.1)

## 2021-10-06 LAB — CBC WITH DIFFERENTIAL/PLATELET
Abs Immature Granulocytes: 0.08 10*3/uL — ABNORMAL HIGH (ref 0.00–0.07)
Basophils Absolute: 0 10*3/uL (ref 0.0–0.1)
Basophils Relative: 0 %
Eosinophils Absolute: 0 10*3/uL (ref 0.0–0.5)
Eosinophils Relative: 0 %
HCT: 51.5 % (ref 39.0–52.0)
Hemoglobin: 17.7 g/dL — ABNORMAL HIGH (ref 13.0–17.0)
Immature Granulocytes: 1 %
Lymphocytes Relative: 4 %
Lymphs Abs: 0.7 10*3/uL (ref 0.7–4.0)
MCH: 34.1 pg — ABNORMAL HIGH (ref 26.0–34.0)
MCHC: 34.4 g/dL (ref 30.0–36.0)
MCV: 99.2 fL (ref 80.0–100.0)
Monocytes Absolute: 0.9 10*3/uL (ref 0.1–1.0)
Monocytes Relative: 6 %
Neutro Abs: 15.3 10*3/uL — ABNORMAL HIGH (ref 1.7–7.7)
Neutrophils Relative %: 89 %
Platelets: 210 10*3/uL (ref 150–400)
RBC: 5.19 MIL/uL (ref 4.22–5.81)
RDW: 13 % (ref 11.5–15.5)
WBC: 17.1 10*3/uL — ABNORMAL HIGH (ref 4.0–10.5)
nRBC: 0 % (ref 0.0–0.2)

## 2021-10-06 LAB — GLUCOSE, CAPILLARY: Glucose-Capillary: 243 mg/dL — ABNORMAL HIGH (ref 70–99)

## 2021-10-06 LAB — URINALYSIS, ROUTINE W REFLEX MICROSCOPIC
Bacteria, UA: NONE SEEN
Bilirubin Urine: NEGATIVE
Glucose, UA: >=500 mg/dL — AB
Ketones, ur: 20 mg/dL — AB
Leukocytes,Ua: NEGATIVE
Nitrite: NEGATIVE
Protein, ur: 100 mg/dL — AB
Specific Gravity, Urine: 1.029 (ref 1.005–1.030)
pH: 5 (ref 5.0–8.0)

## 2021-10-06 LAB — LIPASE, BLOOD: Lipase: 855 U/L — ABNORMAL HIGH (ref 11–51)

## 2021-10-06 LAB — LACTIC ACID, PLASMA
Lactic Acid, Venous: 1.2 mmol/L (ref 0.5–1.9)
Lactic Acid, Venous: 3.1 mmol/L (ref 0.5–1.9)

## 2021-10-06 LAB — PROTIME-INR
INR: 1.7 — ABNORMAL HIGH (ref 0.8–1.2)
Prothrombin Time: 19.5 seconds — ABNORMAL HIGH (ref 11.4–15.2)

## 2021-10-06 LAB — CBG MONITORING, ED: Glucose-Capillary: 302 mg/dL — ABNORMAL HIGH (ref 70–99)

## 2021-10-06 LAB — HIV ANTIBODY (ROUTINE TESTING W REFLEX): HIV Screen 4th Generation wRfx: NONREACTIVE

## 2021-10-06 MED ORDER — ACETAMINOPHEN 650 MG RE SUPP: 650.0000 mg | Freq: Four times a day (QID) | RECTAL | Status: DC | PRN

## 2021-10-06 MED ORDER — ALUM & MAG HYDROXIDE-SIMETH 200-200-20 MG/5ML PO SUSP
30.0000 mL | ORAL | Status: DC | PRN
  Administered 2021-10-06: 30 mL via ORAL
  Filled 2021-10-06 (×2): qty 30

## 2021-10-06 MED ORDER — LACTATED RINGERS IV BOLUS
250.0000 mL | Freq: Once | INTRAVENOUS | Status: AC
  Administered 2021-10-06: 250 mL via INTRAVENOUS

## 2021-10-06 MED ORDER — ADULT MULTIVITAMIN W/MINERALS CH
1.0000 | ORAL_TABLET | Freq: Every day | ORAL | Status: DC
  Administered 2021-10-06 – 2021-10-10 (×5): 1 via ORAL
  Filled 2021-10-06 (×5): qty 1

## 2021-10-06 MED ORDER — THIAMINE HCL 100 MG/ML IJ SOLN
100.0000 mg | Freq: Every day | INTRAMUSCULAR | Status: DC
  Filled 2021-10-06: qty 2

## 2021-10-06 MED ORDER — FENTANYL CITRATE PF 50 MCG/ML IJ SOSY
50.0000 ug | PREFILLED_SYRINGE | Freq: Once | INTRAMUSCULAR | Status: AC
  Administered 2021-10-06: 50 ug via INTRAVENOUS
  Filled 2021-10-06: qty 1

## 2021-10-06 MED ORDER — LORAZEPAM 2 MG/ML IJ SOLN: 1.0000 mg | INTRAMUSCULAR | Status: DC | PRN

## 2021-10-06 MED ORDER — HYDRALAZINE HCL 50 MG PO TABS
50.0000 mg | ORAL_TABLET | Freq: Three times a day (TID) | ORAL | Status: DC
  Administered 2021-10-06 – 2021-10-10 (×11): 50 mg via ORAL
  Filled 2021-10-06 (×11): qty 1

## 2021-10-06 MED ORDER — INSULIN GLARGINE-YFGN 100 UNIT/ML ~~LOC~~ SOLN
14.0000 [IU] | Freq: Every day | SUBCUTANEOUS | Status: DC
Start: 2021-10-06 — End: 2021-10-10
  Administered 2021-10-06 – 2021-10-09 (×4): 14 [IU] via SUBCUTANEOUS
  Filled 2021-10-06 (×5): qty 0.14

## 2021-10-06 MED ORDER — FOLIC ACID 1 MG PO TABS
1.0000 mg | ORAL_TABLET | Freq: Every day | ORAL | Status: DC
  Administered 2021-10-06 – 2021-10-10 (×5): 1 mg via ORAL
  Filled 2021-10-06 (×5): qty 1

## 2021-10-06 MED ORDER — INSULIN ASPART 100 UNIT/ML IJ SOLN
0.0000 [IU] | Freq: Every day | INTRAMUSCULAR | Status: DC
  Administered 2021-10-06 – 2021-10-09 (×3): 2 [IU] via SUBCUTANEOUS
  Filled 2021-10-06: qty 0.05

## 2021-10-06 MED ORDER — IOHEXOL 350 MG/ML SOLN
100.0000 mL | Freq: Once | INTRAVENOUS | Status: AC | PRN
Start: 2021-10-06 — End: 2021-10-06
  Administered 2021-10-06: 100 mL via INTRAVENOUS

## 2021-10-06 MED ORDER — RIVAROXABAN 20 MG PO TABS
20.0000 mg | ORAL_TABLET | Freq: Every day | ORAL | Status: DC
  Administered 2021-10-07 – 2021-10-09 (×3): 20 mg via ORAL
  Filled 2021-10-06 (×3): qty 1

## 2021-10-06 MED ORDER — ACETAMINOPHEN 325 MG PO TABS: 650.0000 mg | ORAL_TABLET | Freq: Four times a day (QID) | ORAL | Status: DC | PRN

## 2021-10-06 MED ORDER — PROCHLORPERAZINE EDISYLATE 10 MG/2ML IJ SOLN: 10.0000 mg | Freq: Four times a day (QID) | INTRAMUSCULAR | Status: DC | PRN

## 2021-10-06 MED ORDER — MORPHINE SULFATE (PF) 2 MG/ML IV SOLN
2.0000 mg | Freq: Once | INTRAVENOUS | Status: AC
  Administered 2021-10-06: 2 mg via INTRAVENOUS
  Filled 2021-10-06: qty 1

## 2021-10-06 MED ORDER — SODIUM CHLORIDE (PF) 0.9 % IJ SOLN
INTRAMUSCULAR | Status: AC
  Filled 2021-10-06: qty 50

## 2021-10-06 MED ORDER — ATORVASTATIN CALCIUM 40 MG PO TABS
80.0000 mg | ORAL_TABLET | Freq: Every day | ORAL | Status: DC
  Administered 2021-10-07 – 2021-10-10 (×4): 80 mg via ORAL
  Filled 2021-10-06 (×4): qty 2

## 2021-10-06 MED ORDER — METOPROLOL TARTRATE 5 MG/5ML IV SOLN
5.0000 mg | Freq: Four times a day (QID) | INTRAVENOUS | Status: DC | PRN
  Administered 2021-10-06 (×2): 5 mg via INTRAVENOUS
  Filled 2021-10-06 (×2): qty 5

## 2021-10-06 MED ORDER — SODIUM CHLORIDE 0.9 % IV BOLUS
1000.0000 mL | Freq: Once | INTRAVENOUS | Status: AC
Start: 2021-10-06 — End: 2021-10-06
  Administered 2021-10-06: 1000 mL via INTRAVENOUS

## 2021-10-06 MED ORDER — LORAZEPAM 1 MG PO TABS: 1.0000 mg | ORAL_TABLET | ORAL | Status: DC | PRN

## 2021-10-06 MED ORDER — THIAMINE MONONITRATE 100 MG PO TABS
100.0000 mg | ORAL_TABLET | Freq: Every day | ORAL | Status: DC
  Administered 2021-10-06 – 2021-10-08 (×3): 100 mg via ORAL
  Filled 2021-10-06 (×3): qty 1

## 2021-10-06 MED ORDER — IRBESARTAN 150 MG PO TABS
150.0000 mg | ORAL_TABLET | Freq: Every day | ORAL | Status: DC
  Administered 2021-10-06 – 2021-10-10 (×5): 150 mg via ORAL
  Filled 2021-10-06 (×5): qty 1

## 2021-10-06 MED ORDER — SODIUM CHLORIDE 0.9 % IV SOLN: INTRAVENOUS | Status: DC

## 2021-10-06 MED ORDER — HYDROMORPHONE HCL 1 MG/ML IJ SOLN
0.5000 mg | INTRAMUSCULAR | Status: DC | PRN
  Administered 2021-10-07 (×3): 0.5 mg via INTRAVENOUS
  Filled 2021-10-06 (×4): qty 0.5

## 2021-10-06 MED ORDER — HYDROMORPHONE HCL 2 MG/ML IJ SOLN
0.5000 mg | INTRAMUSCULAR | Status: DC | PRN
  Administered 2021-10-06: 0.5 mg via INTRAVENOUS
  Filled 2021-10-06: qty 1

## 2021-10-06 MED ORDER — ALLOPURINOL 100 MG PO TABS
100.0000 mg | ORAL_TABLET | Freq: Every day | ORAL | Status: DC
  Administered 2021-10-07 – 2021-10-10 (×4): 100 mg via ORAL
  Filled 2021-10-06 (×4): qty 1

## 2021-10-06 MED ORDER — INSULIN ASPART 100 UNIT/ML IJ SOLN
0.0000 [IU] | Freq: Three times a day (TID) | INTRAMUSCULAR | Status: DC
  Administered 2021-10-06: 11 [IU] via SUBCUTANEOUS
  Administered 2021-10-07 (×2): 5 [IU] via SUBCUTANEOUS
  Administered 2021-10-07: 8 [IU] via SUBCUTANEOUS
  Administered 2021-10-08 (×2): 3 [IU] via SUBCUTANEOUS
  Administered 2021-10-08 – 2021-10-09 (×2): 5 [IU] via SUBCUTANEOUS
  Administered 2021-10-09: 3 [IU] via SUBCUTANEOUS
  Administered 2021-10-09 – 2021-10-10 (×2): 5 [IU] via SUBCUTANEOUS
  Filled 2021-10-06: qty 0.15

## 2021-10-06 MED ORDER — FENOFIBRATE 160 MG PO TABS
160.0000 mg | ORAL_TABLET | Freq: Every day | ORAL | Status: DC
  Administered 2021-10-06 – 2021-10-10 (×5): 160 mg via ORAL
  Filled 2021-10-06 (×5): qty 1

## 2021-10-06 MED ORDER — OXYCODONE HCL 5 MG PO TABS
5.0000 mg | ORAL_TABLET | ORAL | Status: DC | PRN
  Administered 2021-10-06 – 2021-10-09 (×11): 5 mg via ORAL
  Filled 2021-10-06 (×11): qty 1

## 2021-10-06 MED ORDER — NICARDIPINE HCL IN NACL 20-0.86 MG/200ML-% IV SOLN
3.0000 mg/h | INTRAVENOUS | Status: DC
  Filled 2021-10-06: qty 200

## 2021-10-06 NOTE — H&P (Addendum)
History and Physical    Patient: Riley Wiggins DOB: 1965-07-03 DOA: 10/06/2021 DOS: the patient was seen and examined on 10/06/2021 PCP: Gildardo Pounds, NP  Patient coming from: Home  Chief Complaint:  Chief Complaint  Patient presents with   Abdominal Pain   HPI: Riley Wiggins is a 56 y.o. 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 Presenting with LUQ abdominal pain starting 3 days ago. It is crampy and intermittent. He's had some N/V, but currently is not nauseous. He has not had any fever or diarrhea. He denies recent alcohol usage. When his symptoms did not improve this morning, he decided to come to the ED for evaluation.   Review of Systems: As mentioned in the history of present illness. All other systems reviewed and are negative. 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    Past Surgical History:  Procedure Laterality Date   HERNIA REPAIR     " when I was young "   Social History:  reports that he has quit smoking. His smoking use included cigarettes. He has never used smokeless tobacco. He reports that he does not currently use alcohol. He reports that he does not use drugs.  No Known Allergies  Family History  Problem Relation Age of Onset   Diabetes Mother    Diabetes Father    Pancreatitis Neg Hx    Colon cancer Neg Hx    Stomach cancer Neg Hx     Prior to Admission medications   Medication Sig Start Date End Date Taking? Authorizing Provider  acetaminophen (TYLENOL) 500 MG tablet Take 1,000 mg by mouth every 6 (six) hours as needed for mild pain.   Yes [provider]  allopurinol (ZYLOPRIM) 100 MG tablet TAKE 1 TABLET (100 MG TOTAL) BY MOUTH  DAILY. Patient taking differently: Take 100 mg by mouth daily. 02/24/21 02/24/22 Yes Charlott Rakes, MD  Ascorbic Acid (VITAMIN C) 1000 MG tablet Take 1,000 mg by mouth daily.   Yes [provider]  atorvastatin (LIPITOR) 80 MG tablet Take 1 tablet (80 mg total) by mouth daily. 09/15/21  Yes Chandrasekhar, Mahesh A, MD  dapagliflozin propanediol (FARXIGA) 5 MG TABS tablet Take 1 tablet (5 mg total) by mouth daily before breakfast. 09/06/21  Yes Newlin, Enobong, MD  Dulaglutide (TRULICITY) 3 CN/4.7SJ SOPN Inject 3 mg as directed once a week. Patient taking differently: Inject 3 mg as directed once a week. Sunday 09/06/21 10/06/21 Yes Charlott Rakes, MD  Garlic 6283 MG CAPS Take 1,200 mg by mouth daily.   Yes [provider]  Insulin Glargine (BASAGLAR KWIKPEN) 100 UNIT/ML Inject 14 Units into the skin 2 (two) times daily. Patient taking differently: Inject 8-10 Units into the skin See admin instructions. Taking 8 units in the AM and 10 units in the PM 05/11/21  Yes Newlin, Enobong, MD  Multiple Vitamins-Minerals (MULTI FOR HIM 50+) TABS Take 1 tablet by mouth daily.   Yes [provider]  rivaroxaban (XARELTO) 20 MG TABS tablet TAKE 1 TABLET (20 MG TOTAL) BY MOUTH DAILY WITH SUPPER. Patient taking differently: Take 20 mg by mouth daily with supper. 08/07/21 08/07/22 Yes Gildardo Pounds, NP  valsartan (DIOVAN) 160 MG tablet Take  1 tablet (160 mg total) by mouth daily. 05/11/21  Yes Charlott Rakes, MD  Blood Glucose Monitoring Suppl (TRUE METRIX METER) w/Device KIT Use to check blood sugar TID. 11/16/20   Charlott Rakes, MD  glucose blood test strip USE AS DIRECTED TO CHECK BLOOD GLUCOSE TWICE DAILY 11/16/20 11/16/21  Charlott Rakes, MD  Insulin Pen Needle 31G X 5 MM MISC Use as instructed. Inject into the skin once daily. 10/24/20   Gildardo Pounds, NP  TRUEplus Lancets 28G MISC CHECK BLOOD GLUCOSE LEVEL BY FINGERSTICK TWICE PER DAY. E11.65. 11/16/20 11/16/21  Charlott Rakes, MD     Physical Exam: Vitals:   10/06/21 1324 10/06/21 1330 10/06/21 1346 10/06/21 1415  BP: (!) 187/81 (!) 180/82  (!) 180/88  Pulse: 97 (!) 104  99  Resp: (!) _0 Temp:   98.5 F (36.9 C)   TempSrc:   Oral   SpO2: 96% 97%  94%   General: 56 y.o. male resting in bed in NAD Eyes: PERRL, normal sclera ENMT: Nares patent w/o discharge, orophaynx clear, dentition poor, ears w/o discharge/lesions/ulcers Neck: Supple, trachea midline Cardiovascular: tachy, +S1, S2, no m/g/r, equal pulses throughout Respiratory: CTABL, no w/r/r, normal WOB GI: BS+, ND, LUQ TTP, no masses noted, no organomegaly noted MSK: No e/c/c Neuro: A&O x 3, no focal deficits Psyc: Appropriate interaction and affect, calm/cooperative  Data Reviewed:  Lab Results  Component Value Date   NA 134 (L) 10/06/2021   K 4.0 10/06/2021   CO2 17 (L) 10/06/2021   GLUCOSE 194 (H) 10/06/2021   BUN 10 10/06/2021   CREATININE 0.60 (L) 10/06/2021   CALCIUM 8.2 (L) 10/06/2021   EGFR 100 08/23/2021   GFRNONAA >60 10/06/2021   Lab Results  Component Value Date   WBC 17.1 (H) 10/06/2021   HGB 17.7 (H) 10/06/2021   HCT 51.5 10/06/2021   MCV 99.2 10/06/2021   PLT 210 10/06/2021   CTA ab/pelvis 1. Acute pancreatitis involving the entire pancreas, more significantly at the level of the tail of the pancreas. Significant peripancreatic inflammation and fluid present without marginated fluid collection or abscess. No evidence currently of identifiable pancreatic necrosis. 2. Stable hepatic steatosis. 3. Mild aortic atherosclerosis.  CXR: No active cardiopulmonary disease.  Assessment and Plan: Acute pancreatitis SIRS     - admit to inpt, progressive     - NPO except for sips, ice chips     - fluids, pain control, anti-emetics     - denies recent EtOH use; but is shifty on how much he normally drinks, will add CIWA     - trigly's are up to 424; continue statin, can add fenofibrate     - check lactic acid     -  ?induced by trulicity?  HTN urgency     - add hydralazine, PRN metoprolol     - continue home regimen  DM2     - he's NPO except for sips, ice chips     - A1c: 7.9 on 08/23/21     - SSI, glucose checks     - hold trulicity  HLD     - continue home regimen  Hx of SMV thrombosis     - continue home regimen  Advance Care Planning:  Code Status: FULL  Consults: None  Family Communication: None at bedside  Severity of Illness: The appropriate patient status for this patient is INPATIENT. Inpatient status is judged to be reasonable and necessary in order to provide the  required intensity of service to ensure the patient's safety. The patient's presenting symptoms, physical exam findings, and initial radiographic and laboratory data in the context of their chronic comorbidities is felt to place them at high risk for further clinical deterioration. Furthermore, it is not anticipated that the patient will be medically stable for discharge from the hospital within 2 midnights of admission.   * I certify that at the point of admission it is my clinical judgment that the patient will require inpatient hospital care spanning beyond 2 midnights from the point of admission due to high intensity of service, high risk for further deterioration and high frequency of surveillance required.*  Time spent in coordination of this H&P: 74 minutes  Author: Jonnie Finner, DO 10/06/2021 3:19 PM  For on call review [email or web address].

## 2021-10-06 NOTE — ED Triage Notes (Signed)
Pt BIB EMS from home. Pt endorses generalized abdominal pain x3 days. Pt also endorses nausea and no BM in 2 days. Hx of blockage.

## 2021-10-06 NOTE — Plan of Care (Signed)
  Problem: Coping: Goal: Ability to adjust to condition or change in health will improve Outcome: Progressing   Problem: Skin Integrity: Goal: Risk for impaired skin integrity will decrease Outcome: Progressing   Problem: Education: Goal: Knowledge of General Education information will improve Description: Including pain rating scale, medication(s)/side effects and non-pharmacologic comfort measures Outcome: Progressing   Problem: Activity: Goal: Risk for activity intolerance will decrease Outcome: Progressing

## 2021-10-06 NOTE — ED Provider Notes (Signed)
[facility] DEPT Provider Note   CSN: [id number] Arrival date & time: 10/06/21  0944     History  Chief Complaint  Patient presents with   Abdominal Pain    Mcihael Wiggins is a 56 y.o. male.  56 year old male history of superior mesenteric artery occlusion, pancreatitis, presents today complaining of abdominal pain.  Patient evaluated here with labs and imaging.   56 year old man history of hypertension, superior mesenteric artery occlusion, chronically anticoagulated on Xarelto, presents today complaining of 3 days of crampy abdominal pain.  He states it is currently a 10 out of 10.  It waxes and wanes.  He cannot tell me any definite increasing or alleviating factors.  He states he has not been eating as much as usual but denies loss of appetite or increased pain from eating.  He states he vomited 3 times.  He denies any blood in his emesis.  He reports normal bowel movements without blood.  He reports he was off of his Xarelto for 3 days several weeks ago for a colonoscopy.  Otherwise, he reports that he has been taking it on a daily basis.  He does feel that this pain is somewhat similar to his previous symptoms.  He denies alcohol intake on a regular basis and has not drank any alcohol recently.  He did not take anything for pain prior to coming to the emergency department.     Home Medications Prior to Admission medications   Medication Sig Start Date End Date Taking? Authorizing Provider  acetaminophen (TYLENOL) 500 MG tablet Take 1,000 mg by mouth every 6 (six) hours as needed for mild pain.   Yes [provider]  allopurinol (ZYLOPRIM) 100 MG tablet TAKE 1 TABLET (100 MG TOTAL) BY MOUTH DAILY. Patient taking differently: Take 100 mg by mouth daily. 02/24/21 02/24/22 Yes Charlott Rakes, MD  Ascorbic Acid (VITAMIN C) 1000 MG tablet Take 1,000 mg by mouth daily.   Yes [provider]  atorvastatin (LIPITOR) 80 MG tablet Take 1 tablet  (80 mg total) by mouth daily. 09/15/21  Yes Chandrasekhar, Mahesh A, MD  dapagliflozin propanediol (FARXIGA) 5 MG TABS tablet Take 1 tablet (5 mg total) by mouth daily before breakfast. 09/06/21  Yes Newlin, Enobong, MD  Dulaglutide (TRULICITY) 3 ZE/0.9QZ SOPN Inject 3 mg as directed once a week. Patient taking differently: Inject 3 mg as directed once a week. Sunday 09/06/21 10/06/21 Yes Charlott Rakes, MD  Garlic 3007 MG CAPS Take 1,200 mg by mouth daily.   Yes [provider]  Insulin Glargine (BASAGLAR KWIKPEN) 100 UNIT/ML Inject 14 Units into the skin 2 (two) times daily. Patient taking differently: Inject 8-10 Units into the skin See admin instructions. Taking 8 units in the AM and 10 units in the PM 05/11/21  Yes Newlin, Enobong, MD  Multiple Vitamins-Minerals (MULTI FOR HIM 50+) TABS Take 1 tablet by mouth daily.   Yes [provider]  rivaroxaban (XARELTO) 20 MG TABS tablet TAKE 1 TABLET (20 MG TOTAL) BY MOUTH DAILY WITH SUPPER. Patient taking differently: Take 20 mg by mouth daily with supper. 08/07/21 08/07/22 Yes Gildardo Pounds, NP  valsartan (DIOVAN) 160 MG tablet Take 1 tablet (160 mg total) by mouth daily. 05/11/21  Yes Charlott Rakes, MD  Blood Glucose Monitoring Suppl (TRUE METRIX METER) w/Device KIT Use to check blood sugar TID. 11/16/20   Charlott Rakes, MD  glucose blood test strip USE AS DIRECTED TO CHECK BLOOD GLUCOSE TWICE DAILY 11/16/20 11/16/21  Newlin,  Enobong, MD  Insulin Pen Needle 31G X 5 MM MISC Use as instructed. Inject into the skin once daily. 10/24/20   Gildardo Pounds, NP  TRUEplus Lancets 28G MISC CHECK BLOOD GLUCOSE LEVEL BY FINGERSTICK TWICE PER DAY. E11.65. 11/16/20 11/16/21  Charlott Rakes, MD      Allergies    Patient has no known allergies.    Review of Systems   Review of Systems  Physical Exam Updated Vital Signs BP (!) 180/88   Pulse 99   Temp 98.5 F (36.9 C) (Oral)   Resp 16   SpO2 94%  Physical Exam Vitals and nursing note  reviewed.  Constitutional:      General: He is not in acute distress.    Appearance: He is well-developed. He is obese. He is not ill-appearing.  HENT:     Head: Normocephalic.     Mouth/Throat:     Mouth: Mucous membranes are moist.  Eyes:     Extraocular Movements: Extraocular movements intact.  Cardiovascular:     Rate and Rhythm: Normal rate and regular rhythm.  Pulmonary:     Effort: Pulmonary effort is normal.     Breath sounds: Normal breath sounds.  Abdominal:     General: Abdomen is flat.     Palpations: Abdomen is soft.     Tenderness: There is abdominal tenderness in the right upper quadrant, epigastric area and left upper quadrant.  Neurological:     Mental Status: He is alert.     ED Results / Procedures / Treatments   Labs (all labs ordered are listed, but only abnormal results are displayed) Labs Reviewed  URINALYSIS, ROUTINE W REFLEX MICROSCOPIC - Abnormal; Notable for the following components:      Result Value   Glucose, UA >=500 (*)    Hgb urine dipstick SMALL (*)    Ketones, ur 20 (*)    Protein, ur 100 (*)    All other components within normal limits  CBC WITH DIFFERENTIAL/PLATELET - Abnormal; Notable for the following components:   WBC 17.1 (*)    Hemoglobin 17.7 (*)    MCH 34.1 (*)    Neutro Abs 15.3 (*)    Abs Immature Granulocytes 0.08 (*)    All other components within normal limits  PROTIME-INR - Abnormal; Notable for the following components:   Prothrombin Time 19.5 (*)    INR 1.7 (*)    All other components within normal limits  COMPREHENSIVE METABOLIC PANEL - Abnormal; Notable for the following components:   Sodium 134 (*)    CO2 17 (*)    Glucose, Bld 194 (*)    Creatinine, Ser 0.60 (*)    Calcium 8.2 (*)    Total Protein 5.2 (*)    Albumin 2.8 (*)    Total Bilirubin 1.4 (*)    All other components within normal limits  LIPASE, BLOOD - Abnormal; Notable for the following components:   Lipase 855 (*)    All other components  within normal limits    EKG None  Radiology CT Angio Abd/Pel W and/or Wo Contrast  Result Date: 10/06/2021 CLINICAL DATA:  Abdominal pain and nausea. History of pancreatitis in 2021. EXAM: CT ANGIOGRAPHY ABDOMEN AND PELVIS WITH CONTRAST AND WITHOUT CONTRAST TECHNIQUE: Multidetector CT imaging of the abdomen and pelvis was performed using the standard protocol during bolus administration of intravenous contrast. Multiplanar reconstructed images and MIPs were obtained and reviewed to evaluate the vascular anatomy. RADIATION DOSE REDUCTION: This exam was performed according  to the departmental dose-optimization program which includes automated exposure control, adjustment of the mA and/or kV according to patient size and/or use of iterative reconstruction technique. CONTRAST:  140mL OMNIPAQUE IOHEXOL 350 MG/ML SOLN COMPARISON:  CT of the abdomen and pelvis on 03/12/2020 and 08/03/2019 FINDINGS: VASCULAR Aorta: Mild atherosclerosis of the distal abdominal aorta. No evidence of aneurysm or dissection. Celiac: Normally patent. Separate origin of the left gastric artery off of the abdominal aorta. Normally patent branch vessels. SMA: Normally patent. Renals: Normally patent bilateral single renal arteries. IMA: Normally patent. Inflow: Normally patent bilateral iliac arteries. Proximal Outflow: Normally patent bilateral common femoral arteries and femoral bifurcations. Veins: Venous phase imaging demonstrates normal patency of venous structures in the abdomen and pelvis. Review of the MIP images confirms the above findings. NON-VASCULAR Lower chest: No acute abnormality. Hepatobiliary: Stable hepatic steatosis. No overt cirrhosis. The gallbladder is normal. No biliary ductal dilatation. Pancreas: Acute pancreatitis involving the entire pancreas with more significant inflammatory changes at the level of the tail of the pancreas and distal body. No evidence of overt pancreatic necrosis, focal mass or pancreatic  ductal dilatation. Diffuse peripancreatic fluid present without evidence of focal marginated fluid collection or abscess. No gas is seen surrounding the pancreas. Spleen: Normal in size without focal abnormality. Adrenals/Urinary Tract: Adrenal glands are unremarkable. Kidneys are normal, without renal calculi, focal lesion, or hydronephrosis. Bladder is unremarkable. Stomach/Bowel: Bowel shows no evidence of obstruction, ileus, inflammation or lesion. The appendix is normal. No free intraperitoneal air. Lymphatic: No enlarged abdominal or pelvic lymph nodes. Reproductive: Prostate is unremarkable. Other: No abdominal wall hernia or abnormality. No abdominopelvic ascites. Musculoskeletal: No acute or significant osseous findings. IMPRESSION: 1. Acute pancreatitis involving the entire pancreas, more significantly at the level of the tail of the pancreas. Significant peripancreatic inflammation and fluid present without marginated fluid collection or abscess. No evidence currently of identifiable pancreatic necrosis. 2. Stable hepatic steatosis. 3. Mild aortic atherosclerosis. Electronically Signed   By: Aletta Edouard M.D.   On: 10/06/2021 14:48   DG Chest 2 View  Result Date: 10/06/2021 CLINICAL DATA:  Acute upper abdominal pain. EXAM: CHEST - 2 VIEW COMPARISON:  September 13, 2017. FINDINGS: The heart size and mediastinal contours are within normal limits. Both lungs are clear. The visualized skeletal structures are unremarkable. IMPRESSION: No active cardiopulmonary disease. Electronically Signed   By: Marijo Conception M.D.   On: 10/06/2021 10:18    Procedures Procedures    Medications Ordered in ED Medications  sodium chloride (PF) 0.9 % injection (has no administration in time range)  lactated ringers bolus 250 mL (0 mLs Intravenous Stopped 10/06/21 1058)  morphine (PF) 2 MG/ML injection 2 mg (2 mg Intravenous Given 10/06/21 1027)  morphine (PF) 2 MG/ML injection 2 mg (2 mg Intravenous Given 10/06/21 1220)   fentaNYL (SUBLIMAZE) injection 50 mcg (50 mcg Intravenous Given 10/06/21 1346)  iohexol (OMNIPAQUE) 350 MG/ML injection 100 mL (100 mLs Intravenous Contrast Given 10/06/21 1419)    ED Course/ Medical Decision Making/ A&P Clinical Course as of 10/06/21 1531  Fri Oct 06, 2021  1141 CBC reviewed interpreted significant for leukocytosis with white blood cell count 17,100 and hemoglobin elevated at 17 [DR]  1141 Protime-INR(!) INR is elevated at 1.7 [DR]  1141 Urinalysis, Routine w reflex microscopic(!) Urinalysis significant for glucose greater than 500 nitrite negative leukocyte -0-5 red blood cells 0-5 white blood cells and no bacteria seen [DR]  1142 Chest x-Marquist Binstock reviewed interpreted without any evidence of acute infiltrates  or pneumothorax Radiologist interpretation concurs with no evidence of acute abnormality noted [DR]  1514 Patient admitted at 22 [DR]    Clinical Course User Index [DR] Pattricia Boss, MD                           Medical Decision Making 56 year old man history of superior mesenteric artery occlusion and pancreatitis presents today complaining of abdominal pain.  Patient was evaluated here with labs and imaging.  He has been hypertensive but otherwise has remained hemodynamically stable.  He is elevated lipase at 855.  CTA was obtained and no evidence of new occlusion.  He does have evidence of pancreatitis Patient received IV fluids, parenteral pain medicines and antiemetics. He has a slightly elevated creatinine from baseline.  He appears to be somewhat volume depleted Plan admission for ongoing pain control, antiemetics, and hydration Care discussed with Dr. Marylyn Ishihara who will see for admission  Amount and/or Complexity of Data Reviewed Labs: ordered. Decision-making details documented in ED Course. Radiology: ordered and independent interpretation performed. Decision-making details documented in ED Course.  Risk Prescription drug management.           Final  Clinical Impression(s) / ED Diagnoses Final diagnoses:  Acute pancreatitis, unspecified complication status, unspecified pancreatitis type    Rx / DC Orders ED Discharge Orders     None         Pattricia Boss, MD 10/06/21 1531

## 2021-10-06 NOTE — Progress Notes (Signed)
   10/06/21 1936  Assess: MEWS Score  Temp 98.8 F (37.1 C)  BP (!) 198/113  MAP (mmHg) 138  Pulse Rate (!) 111  Resp 20  Level of Consciousness Alert  SpO2 98 %  O2 Device Room Air  Assess: MEWS Score  MEWS Temp 0  MEWS Systolic 0  MEWS Pulse 2  MEWS RR 0  MEWS LOC 0  MEWS Score 2  MEWS Score Color Yellow  Assess: if the MEWS score is Yellow or Red  Were vital signs taken at a resting state? Yes  Focused Assessment No change from prior assessment  Does the patient meet 2 or more of the SIRS criteria? Yes  Does the patient have a confirmed or suspected source of infection? Yes  Provider and Rapid Response Notified? Yes Hal Hope)  MEWS guidelines implemented *See Row Information* Yes  Treat  MEWS Interventions Administered scheduled meds/treatments;Escalated (See documentation below) (Dr Made aware)  Pain Scale 0-10  Pain Score 9  Pain Type Acute pain  Pain Location Abdomen  Pain Orientation Mid  Pain Descriptors / Indicators Sharp  Pain Frequency Constant  Pain Onset Progressive  Patients Stated Pain Goal 3  Pain Intervention(s) Medication (See eMAR)  Take Vital Signs  Increase Vital Sign Frequency  Yellow: Q 2hr X 2 then Q 4hr X 2, if remains yellow, continue Q 4hrs  Escalate  MEWS: Escalate Yellow: discuss with charge nurse/RN and consider discussing with provider and RRT  Notify: Charge Nurse/RN  Name of Charge Nurse/RN Notified Pam  Date Charge Nurse/RN Notified 10/06/21  Time Charge Nurse/RN Notified 2003  Notify: Provider  Provider Name/Title Hal Hope  Date Provider Notified 10/06/21  Time Provider Notified 1946  Method of Notification Call  Notification Reason Other (Comment) (BP 198/113 HR 111)  Provider response See new orders  Date of Provider Response 10/06/21  Time of Provider Response 1947  Assess: SIRS CRITERIA  SIRS Temperature  0  SIRS Pulse 1  SIRS Respirations  0  SIRS WBC 1  SIRS Score Sum  2

## 2021-10-07 DIAGNOSIS — K55069 Acute infarction of intestine, part and extent unspecified: Secondary | ICD-10-CM

## 2021-10-07 DIAGNOSIS — E1169 Type 2 diabetes mellitus with other specified complication: Secondary | ICD-10-CM

## 2021-10-07 DIAGNOSIS — I10 Essential (primary) hypertension: Secondary | ICD-10-CM

## 2021-10-07 DIAGNOSIS — K859 Acute pancreatitis without necrosis or infection, unspecified: Secondary | ICD-10-CM | POA: Diagnosis not present

## 2021-10-07 DIAGNOSIS — E785 Hyperlipidemia, unspecified: Secondary | ICD-10-CM

## 2021-10-07 LAB — COMPREHENSIVE METABOLIC PANEL
ALT: 45 U/L — ABNORMAL HIGH (ref 0–44)
AST: 40 U/L (ref 15–41)
Albumin: 3.7 g/dL (ref 3.5–5.0)
Alkaline Phosphatase: 55 U/L (ref 38–126)
Anion gap: 10 (ref 5–15)
BUN: 12 mg/dL (ref 6–20)
CO2: 20 mmol/L — ABNORMAL LOW (ref 22–32)
Calcium: 8.6 mg/dL — ABNORMAL LOW (ref 8.9–10.3)
Chloride: 106 mmol/L (ref 98–111)
Creatinine, Ser: 0.86 mg/dL (ref 0.61–1.24)
GFR, Estimated: >60 mL/min (ref >60)
Glucose, Bld: 225 mg/dL — ABNORMAL HIGH (ref 70–99)
Potassium: 4 mmol/L (ref 3.5–5.1)
Sodium: 136 mmol/L (ref 135–145)
Total Bilirubin: 1.7 mg/dL — ABNORMAL HIGH (ref 0.3–1.2)
Total Protein: 7.2 g/dL (ref 6.5–8.1)

## 2021-10-07 LAB — LACTIC ACID, PLASMA
Lactic Acid, Venous: 1.4 mmol/L (ref 0.5–1.9)
Lactic Acid, Venous: 2.1 mmol/L (ref 0.5–1.9)

## 2021-10-07 LAB — GLUCOSE, CAPILLARY
Glucose-Capillary: 213 mg/dL — ABNORMAL HIGH (ref 70–99)
Glucose-Capillary: 254 mg/dL — ABNORMAL HIGH (ref 70–99)
Glucose-Capillary: 230 mg/dL — ABNORMAL HIGH (ref 70–99)
Glucose-Capillary: 188 mg/dL — ABNORMAL HIGH (ref 70–99)

## 2021-10-07 LAB — CBC
HCT: 55.1 % — ABNORMAL HIGH (ref 39.0–52.0)
Hemoglobin: 18.4 g/dL — ABNORMAL HIGH (ref 13.0–17.0)
MCH: 34.1 pg — ABNORMAL HIGH (ref 26.0–34.0)
MCHC: 33.4 g/dL (ref 30.0–36.0)
MCV: 102 fL — ABNORMAL HIGH (ref 80.0–100.0)
Platelets: 175 10*3/uL (ref 150–400)
RBC: 5.4 MIL/uL (ref 4.22–5.81)
RDW: 13.3 % (ref 11.5–15.5)
WBC: 23.9 10*3/uL — ABNORMAL HIGH (ref 4.0–10.5)
nRBC: 0 % (ref 0.0–0.2)

## 2021-10-07 MED ORDER — HYDROMORPHONE HCL 1 MG/ML IJ SOLN
1.0000 mg | INTRAMUSCULAR | Status: DC | PRN
  Administered 2021-10-07 – 2021-10-08 (×3): 1 mg via INTRAVENOUS
  Filled 2021-10-07 (×3): qty 1

## 2021-10-07 NOTE — Assessment & Plan Note (Addendum)
Patient with significant pancreatic inflammation on CT scan. Elevated HCT, Pain is 8/10 today.  Plan to continue conservative medical therapy with IV fluids, isotonic fluids at 100 ml per hr Continue pain control with as needed hydromorphone, increase from 0,5 to 1 mg q 4 hrs as needed Keep NPO but ok for ice chips. Continue telemetry monitoring.  Out of bed to chair tid as tolerated.  Alcohol abuse, patient with no signs of withdrawal, will hold on PRN lorazepam for now. Continue neuro checks per unit protocol.

## 2021-10-07 NOTE — Assessment & Plan Note (Signed)
Continue anticoagulation with rivaroxaban 

## 2021-10-07 NOTE — Hospital Course (Signed)
Mr. Antonini was admitted with the working diagnosis of acute pancreatitis  56 yo male with the past medical history of HTN, T2DM, superior mesenteric vein thrombosis, and dyslipidemia who presented with abdominal pain. Reported 3 days of abdominal pain, associated with nausea and vomiting. On his initial physical examination 187/81, HR 97, RR 22 and 02 saturation 96%, lungs with no wheezing or rales, heart with S1 and S2 present and rhythmic, abdomen with tenderness left upper quadrant, no lower extremity edema.   Na 134, K 4,0 Cl 105 bicarbonate 17 glucose 195 bun 10 cr 0,60  Lipase 855  AST 26 ALT 41  Wbc 17,1 hgb 17,7 hct 51, plt 210  Urine analysis SG 1,029, protein 100, wbc 0-5   CT abdomen and pelvis with acute pancreatitis, involving the entire pancreas more significantly at the level of the tail. Significant peripancreatic inflammation and fluid present without marginated fluid collection or abscess. No signs of pancreatic necrosis.   Chest radiograph with no cardiomegaly and no infiltrate.   Right upper [place] with hepatic steatosis.   09/03 advance diet to clears.

## 2021-10-07 NOTE — Assessment & Plan Note (Signed)
Hypertensive urgency.   Systolic blood pressure 377 to 160 mmHg. Plan to continue antihypertensive therapy with irbesartan and hydralazine.

## 2021-10-07 NOTE — Progress Notes (Signed)
10/06/21 1923: Dr Hal Hope made aware of critical result of Lactic acid of 3.1 and BP of 198/113 and HR of 111 1,029m bolus ordered and given. Repeat Lactic after bolus of 1.4  0330: lab called with critical Lactic acid of 2.1. Dr KHal Hopemade aware

## 2021-10-07 NOTE — Assessment & Plan Note (Addendum)
Hyperglycemia.  Fasting glucose is 203   Continue with 14 units of basal insulin and insulin sliding scale for glucose cover and monitoring.   Continue with atorvastatin and fenofibrate.

## 2021-10-07 NOTE — Progress Notes (Addendum)
  Progress Note   Patient: Riley Wiggins IZT:[id number] DOB: 22-Dec-1965 DOA: 10/06/2021     1 DOS: the patient was seen and examined on 10/07/2021   Brief hospital course: Riley Wiggins was admitted with the working diagnosis of acute pancreatitis  56 yo male with the past medical history of HTN, T2DM, superior mesenteric vein thrombosis, and dyslipidemia who presented with abdominal pain. Reported 3 days of abdominal pain, associated with nausea and vomiting. On his initial physical examination 187/81, HR 97, RR 22 and 02 saturation 96%, lungs with no wheezing or rales, heart with S1 and S2 present and rhythmic, abdomen with tenderness left upper quadrant, no lower extremity edema.   Na 134, K 4,0 Cl 105 bicarbonate 17 glucose 195 bun 10 cr 0,60  Lipase 855  AST 26 ALT 41  Wbc 17,1 hgb 17,7 hct 51, plt 210  Urine analysis SG 1,029, protein 100, wbc 0-5   CT abdomen and pelvis with acute pancreatitis, involving the entire pancreas more significantly at the level of the tail. Significant peripancreatic inflammation and fluid present without marginated fluid collection or abscess. No signs of pancreatic necrosis.   Chest radiograph with no cardiomegaly and no infiltrate.   Right upper [place] with hepatic steatosis.   Assessment and Plan: * Acute pancreatitis Patient with significant pancreatic inflammation on CT scan. Elevated HCT, Pain is 8/10 today.  Plan to continue conservative medical therapy with IV fluids, isotonic fluids at 100 ml per hr Continue pain control with as needed hydromorphone, increase from 0,5 to 1 mg q 4 hrs as needed Keep NPO but ok for ice chips. Continue telemetry monitoring.  Out of bed to chair tid as tolerated.  Alcohol abuse, patient with no signs of withdrawal, will hold on PRN lorazepam for now. Continue neuro checks per unit protocol.    HTN (hypertension) Hypertensive urgency.   Systolic blood pressure 382 to 160 mmHg. Plan to continue antihypertensive  therapy with irbesartan and hydralazine.     Type 2 diabetes mellitus with hyperlipidemia (HCC) Fasting glucose this am 225,  Capillary 213, 254 and 230.  Plan to continue basal insulin and sliding scale. Patient is npo but persistent hyperglycemia.    Superior mesenteric artery thrombosis (HCC) Continue anticoagulation with rivaroxaban.         Subjective: Patient with no chest pain, abdominal pain is 8.10 with no nausea or vomiting, he has been tolerating well ice chips   Physical Exam: Vitals:   10/07/21 0502 10/07/21 0502 10/07/21 1242 10/07/21 1640  BP: (!) 165/96 (!) 165/96 (!) 149/92 (!) 147/90  Pulse:  (!) 115  (!) 113  Resp:  (!) '22 18 18  '$ Temp:  99.1 F (37.3 C) 99.1 F (37.3 C) 99.6 F (37.6 C)  TempSrc:  Oral Oral Oral  SpO2:  96% 96% 93%   Neurology awake and alert with no tremors ENT with no pallor Cardiovascular with S1 and S2 present and rhythmic with no gallops Respiratory with no rales or wheezing Abdomen with mild distention, tender to palpation at the epigastrium with no rebound or guarding  No lower extremity edema  Data Reviewed:    Family Communication: I spoke with patient's wife at the bedside, we talked in detail about patient's condition, plan of care and prognosis and all questions were addressed.   Disposition: Status is: Inpatient Remains inpatient appropriate because: pancreatitis   Planned Discharge Destination: Home   Author: Tawni Millers, MD 10/07/2021 5:01 PM  For on call review [email or web address].

## 2021-10-08 DIAGNOSIS — E669 Obesity, unspecified: Secondary | ICD-10-CM

## 2021-10-08 DIAGNOSIS — E871 Hypo-osmolality and hyponatremia: Secondary | ICD-10-CM | POA: Diagnosis not present

## 2021-10-08 DIAGNOSIS — I10 Essential (primary) hypertension: Secondary | ICD-10-CM | POA: Diagnosis not present

## 2021-10-08 DIAGNOSIS — K859 Acute pancreatitis without necrosis or infection, unspecified: Secondary | ICD-10-CM | POA: Diagnosis not present

## 2021-10-08 LAB — BASIC METABOLIC PANEL
Anion gap: 8 (ref 5–15)
BUN: 15 mg/dL (ref 6–20)
CO2: 22 mmol/L (ref 22–32)
Calcium: 8 mg/dL — ABNORMAL LOW (ref 8.9–10.3)
Chloride: 104 mmol/L (ref 98–111)
Creatinine, Ser: 0.75 mg/dL (ref 0.61–1.24)
GFR, Estimated: >60 mL/min (ref >60)
Glucose, Bld: 203 mg/dL — ABNORMAL HIGH (ref 70–99)
Potassium: 3.5 mmol/L (ref 3.5–5.1)
Sodium: 134 mmol/L — ABNORMAL LOW (ref 135–145)

## 2021-10-08 LAB — GLUCOSE, CAPILLARY
Glucose-Capillary: 179 mg/dL — ABNORMAL HIGH (ref 70–99)
Glucose-Capillary: 172 mg/dL — ABNORMAL HIGH (ref 70–99)
Glucose-Capillary: 202 mg/dL — ABNORMAL HIGH (ref 70–99)
Glucose-Capillary: 201 mg/dL — ABNORMAL HIGH (ref 70–99)

## 2021-10-08 LAB — CBC
HCT: 45.5 % (ref 39.0–52.0)
Hemoglobin: 15.4 g/dL (ref 13.0–17.0)
MCH: 34 pg (ref 26.0–34.0)
MCHC: 33.8 g/dL (ref 30.0–36.0)
MCV: 100.4 fL — ABNORMAL HIGH (ref 80.0–100.0)
Platelets: 151 10*3/uL (ref 150–400)
RBC: 4.53 MIL/uL (ref 4.22–5.81)
RDW: 13.9 % (ref 11.5–15.5)
WBC: 17.3 10*3/uL — ABNORMAL HIGH (ref 4.0–10.5)
nRBC: 0 % (ref 0.0–0.2)

## 2021-10-08 NOTE — Progress Notes (Signed)
Progress Note   Patient: Riley Wiggins VOJ:[id number] DOB: [birth year] DOA: 10/06/2021     2 DOS: the patient was seen and examined on 10/08/2021   Brief hospital course: Riley Wiggins was admitted with the working diagnosis of acute pancreatitis  56 yo male with the past medical history of HTN, T2DM, superior mesenteric vein thrombosis, and dyslipidemia who presented with abdominal pain. Reported 3 days of abdominal pain, associated with nausea and vomiting. On his initial physical examination 187/81, HR 97, RR 22 and 02 saturation 96%, lungs with no wheezing or rales, heart with S1 and S2 present and rhythmic, abdomen with tenderness left upper quadrant, no lower extremity edema.   Na 134, K 4,0 Cl 105 bicarbonate 17 glucose 195 bun 10 cr 0,60  Lipase 855  AST 26 ALT 41  Wbc 17,1 hgb 17,7 hct 51, plt 210  Urine analysis SG 1,029, protein 100, wbc 0-5   CT abdomen and pelvis with acute pancreatitis, involving the entire pancreas more significantly at the level of the tail. Significant peripancreatic inflammation and fluid present without marginated fluid collection or abscess. No signs of pancreatic necrosis.   Chest radiograph with no cardiomegaly and no infiltrate.   Right upper [place] with hepatic steatosis.   09/03 advance diet to clears.   Assessment and Plan: * Acute pancreatitis Patient with significant pancreatic inflammation on CT scan. Elevated HCT on admission.  Abdominal pain has improved today, but not back to baseline.   Advance diet to clears.  Continue pain control with as needed hydromorphone and oxycodone.   Continue to encourage to get out of bed to chair. Ambulate in the hallway.   Alcohol abuse: patient with no signs of withdrawal. Continue to hold on benzodiazepines. Ok to discontinue telemetry monitoring.   HTN (hypertension) Hypertensive urgency.   Blood pressure control with metoprolol, irbesartan and hydralazine.     Type 2 diabetes mellitus with  hyperlipidemia (HCC) Hyperglycemia.  Fasting glucose is 203   Continue with 14 units of basal insulin and insulin sliding scale for glucose cover and monitoring.   Continue with atorvastatin and fenofibrate.     Superior mesenteric artery thrombosis (HCC) Continue anticoagulation with rivaroxaban.   Hyponatremia Renal function sable with serum cr at 0,75, K is 3,5 and serum bicarbonate at 22. Na 134.  Plan to advance to clear liquids and decrease isotonic saline infusion to 50 ml per hr.   Class 2 obesity Calculated BMI is 36,5         Subjective: Patient with improvement in abdominal pain but not back to baseline no nausea or vomiting.   Physical Exam: Vitals:   10/07/21 2157 10/08/21 0556 10/08/21 1200 10/08/21 1332  BP: (!) 148/92 130/80  123/76  Pulse: (!) 110 (!) 102  92  Resp: '18 18  16  '$ Temp: 98.6 F (37 C) 98.7 F (37.1 C)  (!) 97.4 F (36.3 C)  TempSrc: Oral Oral  Oral  SpO2: 97% 96%  95%  Weight:   129.1 kg   Height:   '6\' 2"'$  (1.88 m)    Neurology awake and alert ENT with no pallor Cardiovascular with S1 and S2 present and rhythmic Respiratory with no rales or wheezing Abdomen with mild distention, non tender to superficial palpation with no guarding or rebound No lower extremity edema  Data Reviewed:    Family Communication: his wife at the bedside, sleeping at the time of my visit.   Disposition: Status is: Inpatient Remains inpatient appropriate because: acute pancreatitis  Planned Discharge Destination: Home     Author: Tawni Millers, MD 10/08/2021 1:58 PM  For on call review [email or web address].

## 2021-10-08 NOTE — Assessment & Plan Note (Signed)
Renal function sable with serum cr at 0,75, K is 3,5 and serum bicarbonate at 22. Na 134.  Plan to advance to clear liquids and decrease isotonic saline infusion to 50 ml per hr.

## 2021-10-08 NOTE — Assessment & Plan Note (Signed)
Calculated BMI is 36,5

## 2021-10-09 DIAGNOSIS — K859 Acute pancreatitis without necrosis or infection, unspecified: Secondary | ICD-10-CM | POA: Diagnosis not present

## 2021-10-09 LAB — BASIC METABOLIC PANEL
Anion gap: 6 (ref 5–15)
BUN: 13 mg/dL (ref 6–20)
CO2: 23 mmol/L (ref 22–32)
Calcium: 8.3 mg/dL — ABNORMAL LOW (ref 8.9–10.3)
Chloride: 107 mmol/L (ref 98–111)
Creatinine, Ser: 0.64 mg/dL (ref 0.61–1.24)
GFR, Estimated: >60 mL/min (ref >60)
Glucose, Bld: 171 mg/dL — ABNORMAL HIGH (ref 70–99)
Potassium: 3.8 mmol/L (ref 3.5–5.1)
Sodium: 136 mmol/L (ref 135–145)

## 2021-10-09 LAB — MAGNESIUM: Magnesium: 2.3 mg/dL (ref 1.7–2.4)

## 2021-10-09 LAB — GLUCOSE, CAPILLARY
Glucose-Capillary: 164 mg/dL — ABNORMAL HIGH (ref 70–99)
Glucose-Capillary: 210 mg/dL — ABNORMAL HIGH (ref 70–99)
Glucose-Capillary: 203 mg/dL — ABNORMAL HIGH (ref 70–99)
Glucose-Capillary: 246 mg/dL — ABNORMAL HIGH (ref 70–99)

## 2021-10-09 NOTE — TOC Initial Note (Signed)
Transition of Care [facility]) - Initial/Assessment Note    Patient Details  Name: Riley Wiggins MRN: [id number] Date of Birth: 04/24/1965  [facility] CM/SW Contact:    Leeroy Cha, RN Phone Number: 10/09/2021, 8:56 AM  Clinical Narrative:                 Substance abuse resources added into the dc instructions.  Expected Discharge Plan: Home/Self Care Barriers to Discharge: Continued Medical Work up   Patient Goals and CMS Choice Patient states their goals for this hospitalization and ongoing recovery are:: to go back home CMS Medicare.gov Compare Post Acute Care list provided to:: Patient Choice offered to / list presented to : Patient  Expected Discharge Plan and Services Expected Discharge Plan: Home/Self Care   Discharge Planning Services: CM Consult   Living arrangements for the past 2 months: Single Family Home                                      Prior Living Arrangements/Services Living arrangements for the past 2 months: Single Family Home Lives with:: Self Patient language and need for interpreter reviewed:: Yes Do you feel safe going back to the place where you live?: Yes            Criminal Activity/Legal Involvement Pertinent to Current Situation/Hospitalization: No - Comment as needed  Activities of Daily Living Home Assistive Devices/Equipment: None ADL Screening (condition at time of admission) Patient's cognitive ability adequate to safely complete daily activities?: Yes Is the patient deaf or have difficulty hearing?: No Does the patient have difficulty seeing, even when wearing glasses/contacts?: No Does the patient have difficulty concentrating, remembering, or making decisions?: No Patient able to express need for assistance with ADLs?: Yes Does the patient have difficulty dressing or bathing?: No Independently performs ADLs?: Yes (appropriate for developmental age) Does the patient have difficulty walking or climbing  stairs?: No Weakness of Legs: None Weakness of Arms/Hands: None  Permission Sought/Granted                  Emotional Assessment Appearance:: Appears stated age Attitude/Demeanor/Rapport: Engaged Affect (typically observed): Calm Orientation: : Oriented to Self, Oriented to Place, Oriented to  Time, Oriented to Situation Alcohol / Substance Use: Tobacco Use (tobacoo quit smoking, denies etoh or drug use) Psych Involvement: No (comment)  Admission diagnosis:  Acute pancreatitis [K85.90] Acute pancreatitis, unspecified complication status, unspecified pancreatitis type [K85.90] Patient Active Problem List   Diagnosis Date Noted   Hyponatremia 10/08/2021   Class 2 obesity 10/08/2021   Acute pancreatitis 10/06/2021   Aortic root dilation ([place]) 09/13/2021   Superior mesenteric artery thrombosis ([place]) 03/12/2020   Type 2 diabetes mellitus with hyperlipidemia ([place]) 02/03/2020   HTN (hypertension) 08/04/2019   Aortic atherosclerosis ([place]) 08/04/2019   Idiopathic acute pancreatitis 08/03/2019   Diabetic ketoacidosis without coma associated with type 2 diabetes mellitus ([place])    PCP:  Gildardo Pounds, NP Pharmacy:   [place] [facility] [address] [place] [address] Phone: [phone] Fax: [phone]  [place] ([place]), [place] - [address] [place] [address] Phone: [phone] Fax: [phone]     Social Determinants of Health (SDOH) Interventions    Readmission Risk Interventions   Row Labels 03/15/2020   10:04 AM  Readmission Risk Prevention Plan  Section Header. No data exists in this row.   Post Dischage Appt   Complete  Medication Screening   Complete  Transportation Screening   Complete

## 2021-10-09 NOTE — Progress Notes (Signed)
PROGRESS NOTE    Riley Wiggins  WRU:[id number] DOB: November 04, 1965 DOA: 10/06/2021 PCP: Gildardo Pounds, NP   Brief Narrative:  Riley Wiggins was admitted with the working diagnosis of acute pancreatitis   56 yo male with the past medical history of HTN, T2DM, superior mesenteric vein thrombosis, and dyslipidemia who presented with abdominal pain. Reported 3 days of abdominal pain, associated with nausea and vomiting. On his initial physical examination 187/81, HR 97, RR 22 and 02 saturation 96%, lungs with no wheezing or rales, heart with S1 and S2 present and rhythmic, abdomen with tenderness left upper quadrant, no lower extremity edema.    Na 134, K 4,0 Cl 105 bicarbonate 17 glucose 195 bun 10 cr 0,60  Lipase 855  AST 26 ALT 41  Wbc 17,1 hgb 17,7 hct 51, plt 210  Urine analysis SG 1,029, protein 100, wbc 0-5    CT abdomen and pelvis with acute pancreatitis, involving the entire pancreas more significantly at the level of the tail. Significant peripancreatic inflammation and fluid present without marginated fluid collection or abscess. No signs of pancreatic necrosis.    Chest radiograph with no cardiomegaly and no infiltrate.    Right upper [place] with hepatic steatosis.    09/03 advance diet to clears.   Assessment & Plan:   Principal Problem:   Acute pancreatitis Active Problems:   HTN (hypertension)   Type 2 diabetes mellitus with hyperlipidemia (HCC)   Superior mesenteric artery thrombosis (HCC)   Hyponatremia   Class 2 obesity  Acute pancreatitis: Abdominal pain only 5 out of 10.  No nausea.  Tolerated liquid diet.  Will advance to soft diet per patient's request.  He thinks that he needs another day in the hospital.  Will potentially discharge tomorrow.  Abdomen ultrasound negative for gallstones or cholecystitis.  Triglyceride normal.  Etiology remains unclear.  He is not alcoholic.  Hypertensive urgency: Blood pressure now better controlled.  Continue metoprolol, irbesartan and  hydralazine.  Type 2 diabetes mellitus: Blood sugar controlled with 14 units of Lantus and SSI.  Hyperlipidemia: Continue atorvastatin and fenofibrate.  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  Morbid obesity: Weight loss and dietary modification counseled.  DVT prophylaxis:   Xarelto   Code Status: Full Code  Family Communication:  None present at bedside.  Plan of care discussed with patient in length and he/she verbalized understanding and agreed with it.  Status is: Inpatient Remains inpatient appropriate because: Advancing diet slowly.  Potential discharge tomorrow.   Estimated body mass index is 36.55 kg/m as calculated from the following:   Height as of this encounter: '6\' 2"'$  (1.88 m).   Weight as of this encounter: 129.1 kg.    Nutritional Assessment: Body mass index is 36.55 kg/m.Marland Kitchen Seen by dietician.  I agree with the assessment and plan as outlined below: Nutrition Status:        . Skin Assessment: I have examined the patient's skin and I agree with the wound assessment as performed by the wound care RN as outlined below:    Consultants:  None  Procedures:  None  Antimicrobials:  Anti-infectives (From admission, onward)    None         Subjective: Seen and examined.  Feels better but is still with some abdominal pain.  Objective: Vitals:   10/08/21 1200 10/08/21 1332 10/08/21 2110 10/09/21 0511  BP:  123/76 136/83 108/74  Pulse:  92 96 82  Resp:  '16 20 18  '$ Temp:  Marland Kitchen)  97.4 F (36.3 C) 99.3 F (37.4 C) 98.7 F (37.1 C)  TempSrc:  Oral Oral Oral  SpO2:  95% 96% 96%  Weight: 129.1 kg     Height: '6\' 2"'$  (1.88 m)       Intake/Output Summary (Last 24 hours) at 10/09/2021 1053 Last data filed at 10/09/2021 0950 Gross per 24 hour  Intake 2474.49 ml  Output 400 ml  Net 2074.49 ml   Filed Weights   10/08/21 1200  Weight: 129.1 kg    Examination:  General exam: Appears calm and comfortable,  obese Respiratory system: Clear to auscultation. Respiratory effort normal. Cardiovascular system: S1 & S2 heard, RRR. No JVD, murmurs, rubs, gallops or clicks. No pedal edema. Gastrointestinal system: Abdomen is nondistended, soft and mild epigastric tenderness. No organomegaly or masses felt. Normal bowel sounds heard. Central nervous system: Alert and oriented. No focal neurological deficits. Extremities: Symmetric 5 x 5 power. Skin: No rashes, lesions or ulcers Psychiatry: Judgement and insight appear normal. Mood & affect appropriate.    Data Reviewed: I have personally reviewed following labs and imaging studies  CBC: Recent Labs  Lab 10/06/21 1030 10/07/21 0011 10/08/21 0523  WBC 17.1* 23.9* 17.3*  NEUTROABS 15.3*  --   --   HGB 17.7* 18.4* 15.4  HCT 51.5 55.1* 45.5  MCV 99.2 102.0* 100.4*  PLT 210 175 283   Basic Metabolic Panel: Recent Labs  Lab 10/06/21 1210 10/07/21 0011 10/08/21 0523 10/09/21 0404  NA 134* 136 134* 136  K 4.0 4.0 3.5 3.8  CL 105 106 104 107  CO2 17* 20* 22 23  GLUCOSE 194* 225* 203* 171*  BUN '10 12 15 13  '$ CREATININE 0.60* 0.86 0.75 0.64  CALCIUM 8.2* 8.6* 8.0* 8.3*  MG  --   --   --  2.3   GFR: Estimated Creatinine Clearance: 147.3 mL/min (by C-G formula based on SCr of 0.64 mg/dL). Liver Function Tests: Recent Labs  Lab 10/06/21 1210 10/07/21 0011  AST 26 40  ALT 41 45*  ALKPHOS 41 55  BILITOT 1.4* 1.7*  PROT 5.2* 7.2  ALBUMIN 2.8* 3.7   Recent Labs  Lab 10/06/21 1210  LIPASE 855*   No results for input(s): "AMMONIA" in the last 168 hours. Coagulation Profile: Recent Labs  Lab 10/06/21 1030  INR 1.7*   Cardiac Enzymes: No results for input(s): "CKTOTAL", "CKMB", "CKMBINDEX", "TROPONINI" in the last 168 hours. BNP (last 3 results) No results for input(s): "PROBNP" in the last 8760 hours. HbA1C: No results for input(s): "HGBA1C" in the last 72 hours. CBG: Recent Labs  Lab 10/08/21 0731 10/08/21 1144  10/08/21 1642 10/08/21 2108 10/09/21 0719  GLUCAP 179* 172* 202* 201* 164*   Lipid Profile: No results for input(s): "CHOL", "HDL", "LDLCALC", "TRIG", "CHOLHDL", "LDLDIRECT" in the last 72 hours. Thyroid Function Tests: No results for input(s): "TSH", "T4TOTAL", "FREET4", "T3FREE", "THYROIDAB" in the last 72 hours. Anemia Panel: No results for input(s): "VITAMINB12", "FOLATE", "FERRITIN", "TIBC", "IRON", "RETICCTPCT" in the last 72 hours. Sepsis Labs: Recent Labs  Lab 10/06/21 1620 10/06/21 1838 10/07/21 0011 10/07/21 0243  LATICACIDVEN 1.2 3.1* 1.4 2.1*    No results found for this or any previous visit (from the past 240 hour(s)).   Radiology Studies: No results found.  Scheduled Meds:  allopurinol  100 mg Oral Daily   atorvastatin  80 mg Oral Daily   fenofibrate  160 mg Oral Daily   folic acid  1 mg Oral Daily   hydrALAZINE  50 mg  Oral Q8H   insulin aspart  0-15 Units Subcutaneous TID WC   insulin aspart  0-5 Units Subcutaneous QHS   insulin glargine-yfgn  14 Units Subcutaneous QHS   irbesartan  150 mg Oral Daily   multivitamin with minerals  1 tablet Oral Daily   rivaroxaban  20 mg Oral Q supper   Continuous Infusions:  sodium chloride 50 mL/hr at 10/09/21 0919     LOS: 3 days   Darliss Cheney, MD [facility]  10/09/2021, 10:53 AM   *Please note that this is a verbal dictation therefore any spelling or grammatical errors are due to the "[place] [employer]" system interpretation.  Please page via [place] and do not message via secure chat for urgent patient care matters. Secure chat can be used for non urgent patient care matters.  How to contact the [facility] Attending or Consulting provider [place] or covering provider during after hours [place], for this patient?  Check the care team in [facility] and look for a) attending/consulting TR[facility] provider listed and b) the [facility] team listed. Page or secure chat [phone]. Log into [email or web address] and use [place]'s universal  password to access. If you do not have the password, please contact the hospital operator. Locate the [facility] provider you are looking for under [facility] and page to a number that you can be directly reached. If you still have difficulty reaching the provider, please page the [facility] (Director on Call) for the Hospitalists listed on amion for assistance.

## 2021-10-10 ENCOUNTER — Telehealth: Payer: Self-pay

## 2021-10-10 ENCOUNTER — Other Ambulatory Visit: Payer: Self-pay

## 2021-10-10 DIAGNOSIS — K859 Acute pancreatitis without necrosis or infection, unspecified: Secondary | ICD-10-CM | POA: Diagnosis not present

## 2021-10-10 LAB — GLUCOSE, CAPILLARY: Glucose-Capillary: 212 mg/dL — ABNORMAL HIGH (ref 70–99)

## 2021-10-10 MED ORDER — TRULICITY 3 MG/0.5ML ~~LOC~~ SOAJ
3.0000 mg | SUBCUTANEOUS | 1 refills | Status: DC
  Filled 2021-10-10 (×2): qty 2, 28d supply, fill #0
  Filled 2021-11-07: qty 2, 28d supply, fill #1

## 2021-10-10 NOTE — Discharge Summary (Signed)
PatientPhysician Discharge Summary  Riley Wiggins OXB:[id number] DOB: 1965/12/31 DOA: 10/06/2021  PCP: Gildardo Pounds, NP  Admit date: 10/06/2021 Discharge date: 10/10/2021 30 Day Unplanned Readmission Risk Score    Flowsheet Row ED to Hosp-Admission (Current) from 10/06/2021 in [facility]  30 Day Unplanned Readmission Risk Score (%) 14.62 Filed at 10/10/2021 0801       This score is the patient's risk of an unplanned readmission within 30 days of being discharged (0 -100%). The score is based on dignosis, age, lab data, medications, orders, and past utilization.   Low:  0-14.9   Medium: 15-21.9   High: 22-29.9   Extreme: 30 and above          Admitted From: Home Disposition: Home  Recommendations for Outpatient Follow-up:  Follow up with PCP in 1-2 weeks Please obtain BMP/CBC in one week Please follow up with your PCP on the following pending results: Unresulted Labs (From admission, onward)    None         Home Health: None Equipment/Devices: None  Discharge Condition: Stable CODE STATUS: Full code Diet recommendation: Cardiac  Subjective: Seen and examined.  Wife at the bedside.  He is feeling well.  Very minimal pain but tolerating diet without any nausea.  He is ready to go home.  Brief/Interim Summary:  56 yo male with the past medical history of HTN, T2DM, superior mesenteric vein thrombosis, and dyslipidemia who presented with abdominal pain associated with nausea and vomiting. On his initial physical examination 187/81, HR 97, RR 22 and 02 saturation 96%, lungs with no wheezing or rales, heart with S1 and S2 present and rhythmic, abdomen with tenderness left upper quadrant, no lower extremity edema.    Na 134, K 4,0 Cl 105 bicarbonate 17 glucose 195 bun 10 cr 0,60  Lipase 855  AST 26 ALT 41  Wbc 17,1 hgb 17,7 hct 51, plt 210  Urine analysis SG 1,029, protein 100, wbc 0-5    CT abdomen and pelvis with acute pancreatitis,  involving the entire pancreas more significantly at the level of the tail. Significant peripancreatic inflammation and fluid present without marginated fluid collection or abscess. No signs of pancreatic necrosis.   Chest radiograph with no cardiomegaly and no infiltrate.   Right upper [place] with hepatic steatosis.  He was admitted to hospital service for acute pancreatitis which was managed conservatively with n.p.o. and then subsequently started on clears and advance to soft diet and he tolerated well.  Etiology of acute pancreatitis is either idiopathic or medicine induced.   Hypertensive urgency: Blood pressure now better controlled.  Continue metoprolol, irbesartan and hydralazine.   Type 2 diabetes mellitus: Blood sugar controlled with 14 units of Lantus and SSI.   Hyperlipidemia: Continue atorvastatin and fenofibrate.   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   Morbid obesity: Weight loss and dietary modification counseled.  Discharge plan was discussed with patient and/or family member and they verbalized understanding and agreed with it.  Discharge Diagnoses:  Principal Problem:   Acute pancreatitis Active Problems:   HTN (hypertension)   Type 2 diabetes mellitus with hyperlipidemia (HCC)   Superior mesenteric artery thrombosis (HCC)   Hyponatremia   Class 2 obesity    Discharge Instructions   Allergies as of 10/10/2021   No Known Allergies      Medication List     TAKE these medications    acetaminophen 500 MG tablet Commonly known as: TYLENOL Take 1,000  mg by mouth every 6 (six) hours as needed for mild pain.   allopurinol 100 MG tablet Commonly known as: ZYLOPRIM TAKE 1 TABLET (100 MG TOTAL) BY MOUTH DAILY. What changed: how much to take   atorvastatin 80 MG tablet Commonly known as: LIPITOR Take 1 tablet (80 mg total) by mouth daily.   Basaglar KwikPen 100 UNIT/ML Inject 14 Units into the skin 2 (two) times  daily. What changed:  how much to take when to take this additional instructions   Farxiga 5 MG Tabs tablet Generic drug: dapagliflozin propanediol Take 1 tablet (5 mg total) by mouth daily before breakfast.   Garlic 1610 MG Caps Take 1,200 mg by mouth daily.   glucose blood test strip USE AS DIRECTED TO CHECK BLOOD GLUCOSE TWICE DAILY   Insulin Pen Needle 31G X 5 MM Misc Use as instructed. Inject into the skin once daily.   Multi For Him 50+ Tabs Take 1 tablet by mouth daily.   True Metrix Meter w/Device Kit Use to check blood sugar TID.   TRUEplus Lancets 28G Misc CHECK BLOOD GLUCOSE LEVEL BY FINGERSTICK TWICE PER DAY. E11.65.   Trulicity 3 RU/0.4VW Sopn Generic drug: Dulaglutide Inject 3 mg as directed once a week. What changed: additional instructions   valsartan 160 MG tablet Commonly known as: DIOVAN Take 1 tablet (160 mg total) by mouth daily.   vitamin C 1000 MG tablet Take 1,000 mg by mouth daily.   Xarelto 20 MG Tabs tablet Generic drug: rivaroxaban TAKE 1 TABLET (20 MG TOTAL) BY MOUTH DAILY WITH SUPPER. What changed: how much to take        Follow-up Information     Gildardo Pounds, NP Follow up in 1 week(s).   Specialty: Nurse Practitioner Contact information: [place] [address] [phone]         Werner Lean, MD .   Specialty: Cardiology Contact information: [place] [address] [phone]                No Known Allergies  Consultations: None   Procedures/Studies: US Abdomen Limited RUQ (LIVER/GB)  Result Date: 10/06/2021 CLINICAL DATA:  Acute pancreatitis. EXAM: ULTRASOUND ABDOMEN LIMITED RIGHT UPPER QUADRANT COMPARISON:  CT scan of same day.  Ultrasound of March 12, 2020. FINDINGS: Gallbladder: No gallstones or wall thickening visualized. No sonographic Murphy sign noted by sonographer. Common bile duct: Diameter: 3 mm which is within normal limits.  Liver: No focal lesion identified. Increased echogenicity of hepatic parenchyma is noted suggesting hepatic steatosis. Portal vein is patent on color Doppler imaging with normal direction of blood flow towards the liver. Other: None. IMPRESSION: Hepatic steatosis. No other abnormality seen in the right upper quadrant of the abdomen. Electronically Signed   By: Marijo Conception M.D.   On: 10/06/2021 17:14   CT Angio Abd/Pel W and/or Wo Contrast  Result Date: 10/06/2021 CLINICAL DATA:  Abdominal pain and nausea. History of pancreatitis in 2021. EXAM: CT ANGIOGRAPHY ABDOMEN AND PELVIS WITH CONTRAST AND WITHOUT CONTRAST TECHNIQUE: Multidetector CT imaging of the abdomen and pelvis was performed using the standard protocol during bolus administration of intravenous contrast. Multiplanar reconstructed images and MIPs were obtained and reviewed to evaluate the vascular anatomy. RADIATION DOSE REDUCTION: This exam was performed according to the departmental dose-optimization program which includes automated exposure control, adjustment of the mA and/or kV according to patient size and/or use of iterative reconstruction technique. CONTRAST:  134mL OMNIPAQUE  IOHEXOL 350 MG/ML SOLN COMPARISON:  CT of the abdomen and pelvis on 03/12/2020 and 08/03/2019 FINDINGS: VASCULAR Aorta: Mild atherosclerosis of the distal abdominal aorta. No evidence of aneurysm or dissection. Celiac: Normally patent. Separate origin of the left gastric artery off of the abdominal aorta. Normally patent branch vessels. SMA: Normally patent. Renals: Normally patent bilateral single renal arteries. IMA: Normally patent. Inflow: Normally patent bilateral iliac arteries. Proximal Outflow: Normally patent bilateral common femoral arteries and femoral bifurcations. Veins: Venous phase imaging demonstrates normal patency of venous structures in the abdomen and pelvis. Review of the MIP images confirms the above findings. NON-VASCULAR Lower chest: No acute  abnormality. Hepatobiliary: Stable hepatic steatosis. No overt cirrhosis. The gallbladder is normal. No biliary ductal dilatation. Pancreas: Acute pancreatitis involving the entire pancreas with more significant inflammatory changes at the level of the tail of the pancreas and distal body. No evidence of overt pancreatic necrosis, focal mass or pancreatic ductal dilatation. Diffuse peripancreatic fluid present without evidence of focal marginated fluid collection or abscess. No gas is seen surrounding the pancreas. Spleen: Normal in size without focal abnormality. Adrenals/Urinary Tract: Adrenal glands are unremarkable. Kidneys are normal, without renal calculi, focal lesion, or hydronephrosis. Bladder is unremarkable. Stomach/Bowel: Bowel shows no evidence of obstruction, ileus, inflammation or lesion. The appendix is normal. No free intraperitoneal air. Lymphatic: No enlarged abdominal or pelvic lymph nodes. Reproductive: Prostate is unremarkable. Other: No abdominal wall hernia or abnormality. No abdominopelvic ascites. Musculoskeletal: No acute or significant osseous findings. IMPRESSION: 1. Acute pancreatitis involving the entire pancreas, more significantly at the level of the tail of the pancreas. Significant peripancreatic inflammation and fluid present without marginated fluid collection or abscess. No evidence currently of identifiable pancreatic necrosis. 2. Stable hepatic steatosis. 3. Mild aortic atherosclerosis. Electronically Signed   By: Aletta Edouard M.D.   On: 10/06/2021 14:48   DG Chest 2 View  Result Date: 10/06/2021 CLINICAL DATA:  Acute upper abdominal pain. EXAM: CHEST - 2 VIEW COMPARISON:  September 13, 2017. FINDINGS: The heart size and mediastinal contours are within normal limits. Both lungs are clear. The visualized skeletal structures are unremarkable. IMPRESSION: No active cardiopulmonary disease. Electronically Signed   By: Marijo Conception M.D.   On: 10/06/2021 10:18     Discharge  Exam: Vitals:   10/09/21 2039 10/10/21 0438  BP: (!) 166/94 131/76  Pulse: 92 84  Resp: 20 18  Temp: 99.2 F (37.3 C) 99.2 F (37.3 C)  SpO2: 95% 98%   Vitals:   10/09/21 0511 10/09/21 1418 10/09/21 2039 10/10/21 0438  BP: 108/74 (!) 140/86 (!) 166/94 131/76  Pulse: 82 90 92 84  Resp: $Remo'18 14 20 18  'VYKNQ$ Temp: 98.7 F (37.1 C) 99.5 F (37.5 C) 99.2 F (37.3 C) 99.2 F (37.3 C)  TempSrc: Oral Oral Oral Oral  SpO2: 96% 98% 95% 98%  Weight:      Height:        General: Pt is alert, awake, not in acute distress Cardiovascular: RRR, S1/S2 +, no rubs, no gallops Respiratory: CTA bilaterally, no wheezing, no rhonchi Abdominal: Soft, NT, ND, bowel sounds + Extremities: no edema, no cyanosis    The results of significant diagnostics from this hospitalization (including imaging, microbiology, ancillary and laboratory) are listed below for reference.     Microbiology: No results found for this or any previous visit (from the past 240 hour(s)).   Labs: BNP (last 3 results) No results for input(s): "BNP" in the last 8760 hours. Basic Metabolic  Panel: Recent Labs  Lab 10/06/21 1210 10/07/21 0011 10/08/21 0523 10/09/21 0404  NA 134* 136 134* 136  K 4.0 4.0 3.5 3.8  CL 105 106 104 107  CO2 17* 20* 22 23  GLUCOSE 194* 225* 203* 171*  BUN $Re'10 12 15 13  'zlC$ CREATININE 0.60* 0.86 0.75 0.64  CALCIUM 8.2* 8.6* 8.0* 8.3*  MG  --   --   --  2.3   Liver Function Tests: Recent Labs  Lab 10/06/21 1210 10/07/21 0011  AST 26 40  ALT 41 45*  ALKPHOS 41 55  BILITOT 1.4* 1.7*  PROT 5.2* 7.2  ALBUMIN 2.8* 3.7   Recent Labs  Lab 10/06/21 1210  LIPASE 855*   No results for input(s): "AMMONIA" in the last 168 hours. CBC: Recent Labs  Lab 10/06/21 1030 10/07/21 0011 10/08/21 0523  WBC 17.1* 23.9* 17.3*  NEUTROABS 15.3*  --   --   HGB 17.7* 18.4* 15.4  HCT 51.5 55.1* 45.5  MCV 99.2 102.0* 100.4*  PLT 210 175 151   Cardiac Enzymes: No results for input(s): "CKTOTAL", "CKMB",  "CKMBINDEX", "TROPONINI" in the last 168 hours. BNP: Invalid input(s): "POCBNP" CBG: Recent Labs  Lab 10/09/21 0719 10/09/21 1155 10/09/21 1633 10/09/21 2131 10/10/21 0726  GLUCAP 164* 210* 203* 246* 212*   D-Dimer No results for input(s): "DDIMER" in the last 72 hours. Hgb A1c No results for input(s): "HGBA1C" in the last 72 hours. Lipid Profile No results for input(s): "CHOL", "HDL", "LDLCALC", "TRIG", "CHOLHDL", "LDLDIRECT" in the last 72 hours. Thyroid function studies No results for input(s): "TSH", "T4TOTAL", "T3FREE", "THYROIDAB" in the last 72 hours.  Invalid input(s): "FREET3" Anemia work up No results for input(s): "VITAMINB12", "FOLATE", "FERRITIN", "TIBC", "IRON", "RETICCTPCT" in the last 72 hours. Urinalysis    Component Value Date/Time   COLORURINE YELLOW 10/06/2021 1115   APPEARANCEUR CLEAR 10/06/2021 1115   LABSPEC 1.029 10/06/2021 1115   PHURINE 5.0 10/06/2021 1115   GLUCOSEU >=500 (A) 10/06/2021 1115   HGBUR SMALL (A) 10/06/2021 1115   BILIRUBINUR NEGATIVE 10/06/2021 1115   KETONESUR 20 (A) 10/06/2021 1115   PROTEINUR 100 (A) 10/06/2021 1115   NITRITE NEGATIVE 10/06/2021 1115   LEUKOCYTESUR NEGATIVE 10/06/2021 1115   Sepsis Labs Recent Labs  Lab 10/06/21 1030 10/07/21 0011 10/08/21 0523  WBC 17.1* 23.9* 17.3*   Microbiology No results found for this or any previous visit (from the past 240 hour(s)).   Time coordinating discharge: Over 30 minutes  SIGNED:   Darliss Cheney, MD  [facility] 10/10/2021, 10:41 AM *Please note that this is a verbal dictation therefore any spelling or grammatical errors are due to the "[place] [employer]" system interpretation. If 7PM-7AM, please contact night-coverage [email or web address]

## 2021-10-10 NOTE — Telephone Encounter (Signed)
Patient has used his max benefit on his Xarelto copay card and his copay is now over $300(after an additional $200 savings) due to high deductible.  Patient cannot afford and there is not another savings program that I am aware of that is available to patient.  Is there another less expensive therapy available to him?

## 2021-10-10 NOTE — Progress Notes (Signed)
Patient discharged home.  Discharge instructions explained, patient verbalizes understanding 

## 2021-10-10 NOTE — TOC Transition Note (Signed)
Transition of Care [facility]) - CM/SW Discharge Note   Patient Details  Name: Riley Wiggins MRN: [id number] Date of Birth: 23-Oct-1965  [facility] CM/SW Contact:  Leeroy Cha, RN Phone Number: 10/10/2021, 10:49 AM   Clinical Narrative:    Chart reviewed for dc orders and toc needs.  None present, pt dcd to home.   Final next level of care: Home/Self Care Barriers to Discharge: Barriers Resolved   Patient Goals and CMS Choice Patient states their goals for this hospitalization and ongoing recovery are:: to go back home CMS Medicare.gov Compare Post Acute Care list provided to:: Patient Choice offered to / list presented to : Patient  Discharge Placement                       Discharge Plan and Services   Discharge Planning Services: CM Consult                                 Social Determinants of Health (SDOH) Interventions     Readmission Risk Interventions   Row Labels 03/15/2020   10:04 AM  Readmission Risk Prevention Plan   Section Header. No data exists in this row.   Post Dischage Appt   Complete  Medication Screening   Complete  Transportation Screening   Complete

## 2021-10-11 ENCOUNTER — Telehealth: Payer: Self-pay

## 2021-10-11 NOTE — Telephone Encounter (Signed)
Transition Care Management Follow-up Telephone Call Date of discharge and from where: 10/10/2021 [facility] How have you been since you were released from the hospital? He said he is not doing too good. When asked what is wrong he said he is recovering from pancreatitis, no specific complaints.  He did state that he is feeling better than when he was in the hospital.  Any questions or concerns? Yes- his concern is the cost of xarelto.  He has been in contact with [employer] but he only has enough xarelto for 3 days.  Items Reviewed: Did the pt receive and understand the discharge instructions provided? Yes  Medications obtained and verified? Yes - he has all of his medications and a working glucometer; but is running out of xarelto as noted above. Other? No  Any new allergies since your discharge? No  Dietary orders reviewed? Yes Do you have support at home? Yes   Home Care and Equipment/Supplies: Were home health services ordered? no If so, what is the name of the agency? N/a  Has the agency set up a time to come to the patient's home? not applicable Were any new equipment or medical supplies ordered?  No What is the name of the medical supply agency? N/a Were you able to get the supplies/equipment? not applicable Do you have any questions related to the use of the equipment or supplies? No  Functional Questionnaire: (I = Independent and D = Dependent) ADLs: independent   Follow up appointments reviewed:  PCP Hospital f/u appt confirmed? Yes  Scheduled to see [name], NP - 11/07/2021.  He did not want to be seen by another provider Specialist Hospital f/u appt confirmed?  Nothing scheduled until 01/2022.    Are transportation arrangements needed? No  If their condition worsens, is the pt aware to call PCP or go to the Emergency Dept.? Yes Was the patient provided with contact information for the PCP's office or ED? Yes Was to pt encouraged to call  back with questions or concerns? Yes

## 2021-10-11 NOTE — Telephone Encounter (Signed)
Transition Care Management Unsuccessful Follow-up Telephone Call  Date of discharge and from where:  10/10/2021, [facility]  Attempts:  1st Attempt  Reason for unsuccessful TCM follow-up call:  Left voice message on [phone], call back requested.  I also tried [phone] and the recording stated that the number is not in service

## 2021-10-12 ENCOUNTER — Other Ambulatory Visit: Payer: Self-pay | Admitting: Pharmacist

## 2021-10-12 ENCOUNTER — Encounter: Payer: Self-pay | Admitting: Nurse Practitioner

## 2021-10-12 ENCOUNTER — Other Ambulatory Visit: Payer: Self-pay

## 2021-10-12 MED ORDER — APIXABAN 5 MG PO TABS
5.0000 mg | ORAL_TABLET | Freq: Two times a day (BID) | ORAL | 6 refills | Status: DC
  Filled 2021-10-12: qty 60, 30d supply, fill #0
  Filled 2021-11-07: qty 60, 30d supply, fill #1
  Filled 2021-12-08: qty 60, 30d supply, fill #2
  Filled 2022-01-15: qty 60, 30d supply, fill #3
  Filled 2022-02-09: qty 60, 30d supply, fill #4
  Filled 2022-04-12: qty 60, 30d supply, fill #5

## 2021-10-12 NOTE — Telephone Encounter (Signed)
noted 

## 2021-10-12 NOTE — Telephone Encounter (Signed)
Looping Kelly into this conversation for her advice. He has [employer], I don't believe we can apply for MAP. However, we may have the option to sign him up for a copay card.

## 2021-10-12 NOTE — Telephone Encounter (Signed)
It looks like Eliquis is covered and he will have a copay card available for use. Rx sent for Eliquis. Thank you, Claiborne Billings, for your help!

## 2021-10-13 ENCOUNTER — Other Ambulatory Visit: Payer: Self-pay

## 2021-11-07 ENCOUNTER — Ambulatory Visit: Payer: Commercial Managed Care - HMO | Attending: Nurse Practitioner | Admitting: Nurse Practitioner

## 2021-11-07 ENCOUNTER — Encounter: Payer: Self-pay | Admitting: Nurse Practitioner

## 2021-11-07 ENCOUNTER — Other Ambulatory Visit: Payer: Self-pay

## 2021-11-07 VITALS — BP 169/91 | HR 85 | Temp 97.9°F | Ht 73.0 in | Wt 275.6 lb

## 2021-11-07 DIAGNOSIS — D72829 Elevated white blood cell count, unspecified: Secondary | ICD-10-CM | POA: Diagnosis not present

## 2021-11-07 DIAGNOSIS — K859 Acute pancreatitis without necrosis or infection, unspecified: Secondary | ICD-10-CM | POA: Diagnosis not present

## 2021-11-07 DIAGNOSIS — Z09 Encounter for follow-up examination after completed treatment for conditions other than malignant neoplasm: Secondary | ICD-10-CM

## 2021-11-07 NOTE — Progress Notes (Signed)
Assessment & Plan:  Riley Wiggins was seen today for hospitalization follow-up.  Diagnoses and all orders for this visit:  Hospital discharge follow-up -     Lipase -     CMP14+EGFR -     CBC with Differential  Acute pancreatitis without infection or necrosis, unspecified pancreatitis type -     Lipase -     CMP14+EGFR  Leukocytosis, unspecified type -     CBC with Differential    Patient has been counseled on age-appropriate routine health concerns for screening and prevention. These are reviewed and up-to-date. Referrals have been placed accordingly. Immunizations are up-to-date or declined.    Subjective:   Chief Complaint  Patient presents with   Hospitalization Follow-up    Pancreatitis    HPI Riley Wiggins 56 y.o. male presents to office today for [place].  Past medical history of HTN, T2DM, superior mesenteric vein thrombosis, and dyslipidemia   HFU Admitted for 4 days with CT abdomen and pelvis showing acute pancreatitis with no signs of pancreatic necrosis. Chest radiograph negative for cardiomegaly or infiltrates. He was managed conservatively with NPO and slow advanced diet. Etiology to pancreatitis unclear.   Today he states he is feeling well. Denies abd pain, nausea or vomiting.    Blood pressure is elevated in office however he states prior to his office visit his home reading was 117/70. BP Readings from Last 3 Encounters:  11/07/21 (!) 169/91  10/10/21 131/76  09/13/21 130/78     Review of Systems  Constitutional:  Negative for fever, malaise/fatigue and weight loss.  HENT: Negative.  Negative for nosebleeds.   Eyes: Negative.  Negative for blurred vision, double vision and photophobia.  Respiratory: Negative.  Negative for cough and shortness of breath.   Cardiovascular: Negative.  Negative for chest pain, palpitations and leg swelling.  Gastrointestinal: Negative.  Negative for heartburn, nausea and vomiting.  Musculoskeletal: Negative.  Negative for  myalgias.  Neurological: Negative.  Negative for dizziness, focal weakness, seizures and headaches.  Psychiatric/Behavioral: Negative.  Negative for suicidal ideas.     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     Past Surgical History:  Procedure Laterality Date   HERNIA REPAIR     " when I was young "    Family History  Problem Relation Age of Onset   Diabetes Mother    Diabetes Father    Pancreatitis Neg Hx    Colon cancer Neg Hx    Stomach cancer Neg Hx     Social History Reviewed with no changes to be made today.   Outpatient Medications Prior to Visit  Medication Sig Dispense Refill   acetaminophen (TYLENOL) 500 MG tablet Take 1,000 mg by mouth every 6 (six) hours as needed for mild pain.     allopurinol (ZYLOPRIM) 100 MG tablet TAKE 1 TABLET (100 MG TOTAL) BY MOUTH DAILY. (Patient taking differently: Take 100 mg by mouth daily.) 90 tablet 1   apixaban (ELIQUIS) 5 MG TABS tablet Take 1 tablet (5 mg total) by mouth 2 (two) times daily. 60 tablet 6   Ascorbic Acid (VITAMIN C) 1000 MG tablet Take 1,000 mg by mouth daily.     atorvastatin (LIPITOR) 80 MG tablet Take 1 tablet (80 mg total) by mouth  daily. 90 tablet 3   Blood Glucose Monitoring Suppl (TRUE METRIX METER) w/Device KIT Use to check blood sugar TID. 1 kit 0   dapagliflozin propanediol (FARXIGA) 5 MG TABS tablet Take 1 tablet (5 mg total) by mouth daily before breakfast. 30 tablet 2   Dulaglutide (TRULICITY) 3 XY/5.8PF SOPN Inject 3 mg as directed once a week. 2 mL 1   Garlic 2924 MG CAPS Take 1,200 mg by mouth daily.     glucose blood test strip USE AS DIRECTED TO CHECK BLOOD GLUCOSE TWICE DAILY 100 strip 12   Insulin Glargine (BASAGLAR KWIKPEN) 100 UNIT/ML Inject 14 Units into  the skin 2 (two) times daily. (Patient taking differently: Inject 8-10 Units into the skin See admin instructions. Taking 8 units in the AM and 10 units in the PM) 15 mL PRN   Insulin Pen Needle 31G X 5 MM MISC Use as instructed. Inject into the skin once daily. 100 each 5   Multiple Vitamins-Minerals (MULTI FOR HIM 50+) TABS Take 1 tablet by mouth daily.     TRUEplus Lancets 28G MISC CHECK BLOOD GLUCOSE LEVEL BY FINGERSTICK TWICE PER DAY. E11.65. 100 each 1   valsartan (DIOVAN) 160 MG tablet Take 1 tablet (160 mg total) by mouth daily. 90 tablet 3   No facility-administered medications prior to visit.    No Known Allergies     Objective:    BP (!) 169/91   Pulse 85   Temp 97.9 F (36.6 C) (Oral)   Ht $R'6\' 1"'Tx$  (1.854 m)   Wt 275 lb 9.6 oz (125 kg)   SpO2 98%   BMI 36.36 kg/m  Wt Readings from Last 3 Encounters:  11/07/21 275 lb 9.6 oz (125 kg)  10/08/21 284 lb 11.2 oz (129.1 kg)  09/13/21 286 lb (129.7 kg)    Physical Exam Vitals and nursing note reviewed.  Constitutional:      Appearance: He is well-developed.  HENT:     Head: Normocephalic and atraumatic.  Cardiovascular:     Rate and Rhythm: Normal rate and regular rhythm.     Heart sounds: Normal heart sounds. No murmur heard.    No friction rub. No gallop.  Pulmonary:     Effort: Pulmonary effort is normal. No tachypnea or respiratory distress.     Breath sounds: Normal breath sounds. No decreased breath sounds, wheezing, rhonchi or rales.  Chest:     Chest wall: No tenderness.  Abdominal:     General: Bowel sounds are normal.     Palpations: Abdomen is soft.  Musculoskeletal:        General: Normal range of motion.     Cervical back: Normal range of motion.  Skin:    General: Skin is warm and dry.  Neurological:     Mental Status: He is alert and oriented to person, place, and time.     Coordination: Coordination normal.  Psychiatric:        Behavior: Behavior normal. Behavior is cooperative.        Thought  Content: Thought content normal.        Judgment: Judgment normal.          Patient has been counseled extensively about nutrition and exercise as well as the importance of adherence with medications and regular follow-up. The patient was given clear instructions to go to ER or return to medical center if symptoms don't improve, worsen or new problems develop. The patient verbalized understanding.   Follow-up: Return in about  6 weeks (around 12/19/2021) for DM.   Gildardo Pounds, FNP-BC [facility] and [facility] [place], [place]   11/07/2021, 9:22 PM

## 2021-11-08 LAB — CBC WITH DIFFERENTIAL/PLATELET
Basophils Absolute: 0.1 10*3/uL (ref 0.0–0.2)
Basos: 1 %
EOS (ABSOLUTE): 0.2 10*3/uL (ref 0.0–0.4)
Eos: 2 %
Hematocrit: 45.1 % (ref 37.5–51.0)
Hemoglobin: 15.6 g/dL (ref 13.0–17.7)
Immature Grans (Abs): 0 10*3/uL (ref 0.0–0.1)
Immature Granulocytes: 0 %
Lymphocytes Absolute: 3.9 10*3/uL — ABNORMAL HIGH (ref 0.7–3.1)
Lymphs: 38 %
MCH: 33.5 pg — ABNORMAL HIGH (ref 26.6–33.0)
MCHC: 34.6 g/dL (ref 31.5–35.7)
MCV: 97 fL (ref 79–97)
Monocytes Absolute: 0.9 10*3/uL (ref 0.1–0.9)
Monocytes: 9 %
Neutrophils Absolute: 5.2 10*3/uL (ref 1.4–7.0)
Neutrophils: 50 %
Platelets: 187 10*3/uL (ref 150–450)
RBC: 4.65 x10E6/uL (ref 4.14–5.80)
RDW: 13 % (ref 11.6–15.4)
WBC: 10.3 10*3/uL (ref 3.4–10.8)

## 2021-11-08 LAB — CMP14+EGFR
ALT: 20 IU/L (ref 0–44)
AST: 19 IU/L (ref 0–40)
Albumin/Globulin Ratio: 1.8 (ref 1.2–2.2)
Albumin: 4.6 g/dL (ref 3.8–4.9)
Alkaline Phosphatase: 87 IU/L (ref 44–121)
BUN/Creatinine Ratio: 10 (ref 9–20)
BUN: 9 mg/dL (ref 6–24)
Bilirubin Total: 0.5 mg/dL (ref 0.0–1.2)
CO2: 21 mmol/L (ref 20–29)
Calcium: 10.3 mg/dL — ABNORMAL HIGH (ref 8.7–10.2)
Chloride: 101 mmol/L (ref 96–106)
Creatinine, Ser: 0.86 mg/dL (ref 0.76–1.27)
Globulin, Total: 2.6 g/dL (ref 1.5–4.5)
Glucose: 125 mg/dL — ABNORMAL HIGH (ref 70–99)
Potassium: 4.4 mmol/L (ref 3.5–5.2)
Sodium: 141 mmol/L (ref 134–144)
Total Protein: 7.2 g/dL (ref 6.0–8.5)
eGFR: 102 mL/min/{1.73_m2} (ref >59)

## 2021-11-08 LAB — LIPASE: Lipase: 55 U/L (ref 13–78)

## 2021-11-20 ENCOUNTER — Other Ambulatory Visit: Payer: Self-pay | Admitting: Nurse Practitioner

## 2021-11-20 ENCOUNTER — Other Ambulatory Visit: Payer: Self-pay

## 2021-11-20 DIAGNOSIS — E1165 Type 2 diabetes mellitus with hyperglycemia: Secondary | ICD-10-CM

## 2021-11-20 NOTE — Telephone Encounter (Signed)
Medication Refill - Medication: Insulin Pen Needle 31G X 5 MM MISC  Has the patient contacted their pharmacy? No. (Agent: If no, request that the patient contact the pharmacy for the refill. If patient does not wish to contact the pharmacy document the reason why and proceed with request.) Pt wants to start using [place] (Agent: If yes, when and what did the pharmacy advise?) call dr  Preferred Pharmacy (with phone number or street name):  [facility] [address] [place] [address]  Phone: [phone] Fax: [phone]  Hours: M-F 7:30a-6:00p   Has the patient been seen for an appointment in the last year OR does the patient have an upcoming appointment? Yes.   Just seen/ almost out!!!  Agent: Please be advised that RX refills may take up to 3 business days. We ask that you follow-up with your pharmacy.

## 2021-11-21 ENCOUNTER — Other Ambulatory Visit: Payer: Self-pay

## 2021-11-21 MED ORDER — INSULIN PEN NEEDLE 31G X 5 MM MISC
0 refills | Status: DC
  Filled 2021-11-21: qty 100, 30d supply, fill #0

## 2021-11-21 NOTE — Telephone Encounter (Signed)
Requested Prescriptions  Pending Prescriptions Disp Refills  . Insulin Pen Needle 31G X 5 MM MISC 100 each 0    Sig: Use as instructed. Inject into the skin once daily.     Endocrinology: Diabetes - Testing Supplies Passed - 11/20/2021  1:01 PM      Passed - Valid encounter within last 12 months    Recent Outpatient Visits          2 weeks ago Hospital discharge follow-up   [place], Vernia Buff, NP   3 months ago Primary hypertension   [place], [place] W, NP   6 months ago Type 2 diabetes mellitus with hyperglycemia, without long-term current use of insulin [facility])   [place], Jarome Matin, RPH-CPP   9 months ago Essential hypertension   [place], Jarome Matin, RPH-CPP   10 months ago Type 2 diabetes mellitus with hyperglycemia, without long-term current use of insulin [facility])   [place], Zelda W, NP      Future Appointments            In 4 weeks Gildardo Pounds, NP [place]

## 2021-11-27 ENCOUNTER — Other Ambulatory Visit: Payer: Self-pay

## 2021-11-27 ENCOUNTER — Other Ambulatory Visit: Payer: Self-pay | Admitting: Nurse Practitioner

## 2021-11-27 DIAGNOSIS — E1165 Type 2 diabetes mellitus with hyperglycemia: Secondary | ICD-10-CM

## 2021-11-27 MED ORDER — TRULICITY 3 MG/0.5ML ~~LOC~~ SOAJ
3.0000 mg | SUBCUTANEOUS | 3 refills | Status: DC
  Filled 2021-11-27 – 2021-12-08 (×2): qty 2, 28d supply, fill #0
  Filled 2022-01-15: qty 2, 28d supply, fill #1
  Filled 2022-02-06: qty 2, 28d supply, fill #2
  Filled 2022-03-08: qty 2, 28d supply, fill #3

## 2021-12-08 ENCOUNTER — Other Ambulatory Visit: Payer: Self-pay | Admitting: Family Medicine

## 2021-12-08 ENCOUNTER — Other Ambulatory Visit: Payer: Self-pay

## 2021-12-08 MED ORDER — DAPAGLIFLOZIN PROPANEDIOL 5 MG PO TABS
5.0000 mg | ORAL_TABLET | Freq: Every day | ORAL | 2 refills | Status: DC
  Filled 2021-12-08: qty 30, 30d supply, fill #0

## 2021-12-14 ENCOUNTER — Other Ambulatory Visit: Payer: Self-pay

## 2021-12-19 ENCOUNTER — Other Ambulatory Visit: Payer: Self-pay

## 2021-12-19 ENCOUNTER — Encounter: Payer: Self-pay | Admitting: Nurse Practitioner

## 2021-12-19 ENCOUNTER — Ambulatory Visit: Payer: Commercial Managed Care - HMO | Attending: Nurse Practitioner | Admitting: Nurse Practitioner

## 2021-12-19 VITALS — BP 138/82 | HR 91 | Temp 98.0°F | Ht 73.0 in | Wt 276.6 lb

## 2021-12-19 DIAGNOSIS — E1165 Type 2 diabetes mellitus with hyperglycemia: Secondary | ICD-10-CM | POA: Diagnosis not present

## 2021-12-19 LAB — POCT GLYCOSYLATED HEMOGLOBIN (HGB A1C): HbA1c, POC (controlled diabetic range): 7.2 % — AB (ref 0.0–7.0)

## 2021-12-19 MED ORDER — DAPAGLIFLOZIN PROPANEDIOL 10 MG PO TABS
10.0000 mg | ORAL_TABLET | Freq: Every day | ORAL | 1 refills | Status: DC
  Filled 2021-12-19 – 2022-01-15 (×3): qty 30, 30d supply, fill #0
  Filled 2022-02-09: qty 30, 30d supply, fill #1
  Filled 2022-03-08: qty 30, 30d supply, fill #2
  Filled 2022-04-12: qty 30, 30d supply, fill #3

## 2021-12-19 NOTE — Progress Notes (Signed)
Assessment & Plan:  Joakim was seen today for diabetes.  Diagnoses and all orders for this visit:  Type 2 diabetes mellitus with hyperglycemia, without long-term current use of insulin (HCC) -     POCT glycosylated hemoglobin (Hb A1C) -     dapagliflozin propanediol (FARXIGA) 10 MG TABS tablet; Take 1 tablet (10 mg total) by mouth daily before breakfast.    Patient has been counseled on age-appropriate routine health concerns for screening and prevention. These are reviewed and up-to-date. Referrals have been placed accordingly. Immunizations are up-to-date or declined.    Subjective:   Chief Complaint  Patient presents with   Diabetes    No concerns.    HPI Riley Wiggins 56 y.o. male presents to office today for follow-up to diabetes. Past medical history of HTN, T2DM, superior mesenteric vein thrombosis, and dyslipidemia      DM 2 Improved but currently not at goal.  Instead of taking Basaglar 14 units twice daily he is taking 20 units in am 8 in the evening along with Farxiga 5 mg daily.  We Will increase Farxiga to 10 mg today.  He will continue on Basaglar and Trulicity 3 mg weekly Lab Results  Component Value Date   HGBA1C 7.2 (A) 12/19/2021    Blood pressure well controlled BP Readings from Last 3 Encounters:  12/19/21 138/82  11/07/21 (!) 169/91  10/10/21 131/76     Review of Systems  Constitutional:  Negative for fever, malaise/fatigue and weight loss.  HENT: Negative.  Negative for nosebleeds.   Eyes: Negative.  Negative for blurred vision, double vision and photophobia.  Respiratory: Negative.  Negative for cough and shortness of breath.   Cardiovascular: Negative.  Negative for chest pain, palpitations and leg swelling.  Gastrointestinal: Negative.  Negative for heartburn, nausea and vomiting.  Musculoskeletal: Negative.  Negative for myalgias.  Neurological: Negative.  Negative for dizziness, focal weakness, seizures and headaches.   Psychiatric/Behavioral: Negative.  Negative for suicidal ideas.     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     Past Surgical History:  Procedure Laterality Date   HERNIA REPAIR     " when I was young "    Family History  Problem Relation Age of Onset   Diabetes Mother    Diabetes Father    Pancreatitis Neg Hx    Colon cancer Neg Hx    Stomach cancer Neg Hx     Social History Reviewed with no changes to be made today.   Outpatient Medications Prior to Visit  Medication Sig Dispense Refill   acetaminophen (TYLENOL) 500 MG tablet Take 1,000 mg by mouth every 6 (six) hours as needed for mild pain.     allopurinol (ZYLOPRIM) 100 MG tablet TAKE 1 TABLET (100 MG TOTAL) BY MOUTH DAILY. (Patient taking differently: Take 100 mg by mouth daily.) 90 tablet 1   apixaban (ELIQUIS) 5 MG TABS tablet Take 1 tablet (5 mg total) by mouth 2 (two) times daily. 60 tablet 6   Ascorbic Acid (VITAMIN C) 1000 MG tablet Take 1,000 mg by mouth daily.     atorvastatin (LIPITOR) 80 MG tablet Take 1 tablet (80 mg total) by mouth daily. 90 tablet 3   Blood Glucose Monitoring Suppl (TRUE METRIX  METER) w/Device KIT Use to check blood sugar TID. 1 kit 0   Dulaglutide (TRULICITY) 3 ZO/1.0RU SOPN Inject 3 mg as directed once a week. 2 mL 3   Garlic 0454 MG CAPS Take 1,200 mg by mouth daily.     Insulin Glargine (BASAGLAR KWIKPEN) 100 UNIT/ML Inject 14 Units into the skin 2 (two) times daily. (Patient taking differently: Inject 8-10 Units into the skin See admin instructions. Taking 8 units in the AM and 10 units in the PM) 15 mL PRN   Insulin Pen Needle 31G X 5 MM MISC Use as instructed. Inject into the skin once daily. 100 each 0   Multiple Vitamins-Minerals (MULTI FOR HIM  50+) TABS Take 1 tablet by mouth daily.     valsartan (DIOVAN) 160 MG tablet Take 1 tablet (160 mg total) by mouth daily. 90 tablet 3   dapagliflozin propanediol (FARXIGA) 5 MG TABS tablet Take 1 tablet (5 mg total) by mouth daily before breakfast. 30 tablet 2   No facility-administered medications prior to visit.    No Known Allergies     Objective:    BP 138/82 (BP Location: Left Arm, Cuff Size: Large)   Pulse 91   Temp 98 F (36.7 C) (Temporal)   Ht 6' 1" (1.854 m)   Wt 276 lb 9.6 oz (125.5 kg)   SpO2 99%   BMI 36.49 kg/m  Wt Readings from Last 3 Encounters:  12/19/21 276 lb 9.6 oz (125.5 kg)  11/07/21 275 lb 9.6 oz (125 kg)  10/08/21 284 lb 11.2 oz (129.1 kg)    Physical Exam Vitals and nursing note reviewed.  Constitutional:      Appearance: He is well-developed.  HENT:     Head: Normocephalic and atraumatic.  Cardiovascular:     Rate and Rhythm: Normal rate and regular rhythm.     Heart sounds: Normal heart sounds. No murmur heard.    No friction rub. No gallop.  Pulmonary:     Effort: Pulmonary effort is normal. No tachypnea or respiratory distress.     Breath sounds: Normal breath sounds. No decreased breath sounds, wheezing, rhonchi or rales.  Chest:     Chest wall: No tenderness.  Abdominal:     General: Bowel sounds are normal.     Palpations: Abdomen is soft.  Musculoskeletal:        General: Normal range of motion.     Cervical back: Normal range of motion.  Skin:    General: Skin is warm and dry.  Neurological:     Mental Status: He is alert and oriented to person, place, and time.     Coordination: Coordination normal.  Psychiatric:        Behavior: Behavior normal. Behavior is cooperative.        Thought Content: Thought content normal.        Judgment: Judgment normal.          Patient has been counseled extensively about nutrition and exercise as well as the importance of adherence with medications and regular follow-up. The patient was  given clear instructions to go to ER or return to medical center if symptoms don't improve, worsen or new problems develop. The patient verbalized understanding.   Follow-up: Return in about 3 months (around 03/21/2022) for HTN DM.   Gildardo Pounds, FNP-BC [facility] and [place], [place]   12/19/2021, 12:01 PM

## 2021-12-25 ENCOUNTER — Other Ambulatory Visit: Payer: Self-pay

## 2021-12-29 ENCOUNTER — Other Ambulatory Visit: Payer: Self-pay

## 2022-01-05 ENCOUNTER — Other Ambulatory Visit: Payer: Self-pay

## 2022-01-05 ENCOUNTER — Ambulatory Visit (HOSPITAL_COMMUNITY): Payer: Commercial Managed Care - HMO | Attending: Cardiovascular Disease

## 2022-01-05 ENCOUNTER — Ambulatory Visit: Payer: Commercial Managed Care - HMO

## 2022-01-05 DIAGNOSIS — I10 Essential (primary) hypertension: Secondary | ICD-10-CM | POA: Diagnosis present

## 2022-01-05 DIAGNOSIS — I77810 Thoracic aortic ectasia: Secondary | ICD-10-CM | POA: Insufficient documentation

## 2022-01-05 DIAGNOSIS — E1169 Type 2 diabetes mellitus with other specified complication: Secondary | ICD-10-CM

## 2022-01-05 DIAGNOSIS — Z79899 Other long term (current) drug therapy: Secondary | ICD-10-CM

## 2022-01-05 DIAGNOSIS — E785 Hyperlipidemia, unspecified: Secondary | ICD-10-CM | POA: Diagnosis present

## 2022-01-05 DIAGNOSIS — I700 Atherosclerosis of aorta: Secondary | ICD-10-CM | POA: Diagnosis not present

## 2022-01-05 DIAGNOSIS — E119 Type 2 diabetes mellitus without complications: Secondary | ICD-10-CM | POA: Insufficient documentation

## 2022-01-05 LAB — LIPID PANEL
Chol/HDL Ratio: 3.2 ratio (ref 0.0–5.0)
Cholesterol, Total: 196 mg/dL (ref 100–199)
HDL: 62 mg/dL (ref >39)
LDL Chol Calc (NIH): 97 mg/dL (ref 0–99)
Triglycerides: 222 mg/dL — ABNORMAL HIGH (ref 0–149)
VLDL Cholesterol Cal: 37 mg/dL (ref 5–40)

## 2022-01-05 LAB — ECHOCARDIOGRAM COMPLETE
Area-P 1/2: 3.87 cm2
S' Lateral: 3.1 cm

## 2022-01-05 LAB — ALT: ALT: 39 IU/L (ref 0–44)

## 2022-01-05 MED ORDER — PERFLUTREN LIPID MICROSPHERE
1.0000 mL | INTRAVENOUS | Status: AC | PRN
  Administered 2022-01-05: 2 mL via INTRAVENOUS

## 2022-01-15 ENCOUNTER — Other Ambulatory Visit: Payer: Self-pay

## 2022-02-09 ENCOUNTER — Other Ambulatory Visit: Payer: Self-pay

## 2022-02-09 ENCOUNTER — Other Ambulatory Visit: Payer: Self-pay | Admitting: Nurse Practitioner

## 2022-02-09 DIAGNOSIS — E1165 Type 2 diabetes mellitus with hyperglycemia: Secondary | ICD-10-CM

## 2022-02-09 MED ORDER — TECHLITE PEN NEEDLES 31G X 5 MM MISC
3 refills | Status: DC
  Filled 2022-02-09: qty 100, 25d supply, fill #0
  Filled 2022-04-12: qty 100, 25d supply, fill #1
  Filled 2022-05-12: qty 100, 25d supply, fill #2
  Filled 2022-07-14 – 2022-07-16 (×2): qty 100, 25d supply, fill #3

## 2022-02-09 NOTE — Telephone Encounter (Signed)
Requested Prescriptions  Pending Prescriptions Disp Refills   Insulin Pen Needle (TECHLITE PEN NEEDLES) 31G X 5 MM MISC 100 each 3    Sig: Use as instructed. Inject into the skin once daily.     Endocrinology: Diabetes - Testing Supplies Passed - 02/09/2022 10:04 AM      Passed - Valid encounter within last 12 months    Recent Outpatient Visits           1 month ago Type 2 diabetes mellitus with hyperglycemia, without long-term current use of insulin [facility])   [place], Vernia Buff, NP   3 months ago Hospital discharge follow-up   [place] Gildardo Pounds, NP   6 months ago Primary hypertension   [place], [place] W, NP   9 months ago Type 2 diabetes mellitus with hyperglycemia, without long-term current use of insulin [facility])   [place], Jarome Matin, RPH-CPP   11 months ago Essential hypertension   [place], [place], RPH-CPP       Future Appointments             In 1 month Gildardo Pounds, NP [place]

## 2022-02-12 ENCOUNTER — Other Ambulatory Visit: Payer: Self-pay

## 2022-03-02 ENCOUNTER — Telehealth: Payer: Self-pay

## 2022-03-02 ENCOUNTER — Other Ambulatory Visit: Payer: Self-pay

## 2022-03-02 DIAGNOSIS — I700 Atherosclerosis of aorta: Secondary | ICD-10-CM

## 2022-03-02 DIAGNOSIS — E785 Hyperlipidemia, unspecified: Secondary | ICD-10-CM

## 2022-03-02 MED ORDER — EZETIMIBE 10 MG PO TABS
10.0000 mg | ORAL_TABLET | Freq: Every day | ORAL | 3 refills | Status: DC
  Filled 2022-03-02: qty 90, 90d supply, fill #0

## 2022-03-02 MED ORDER — EZETIMIBE 10 MG PO TABS: 10.0000 mg | ORAL_TABLET | Freq: Every day | ORAL | 3 refills | Status: DC

## 2022-03-02 NOTE — Telephone Encounter (Signed)
-----  Message from Werner Lean, MD sent at 01/07/2022  3:04 PM EST ----- LDL above goal; zetia 10 mg and labs in three months ----- Message ----- From: Deboraha Sprang, MD Sent: 01/06/2022  11:21 AM EST To: Werner Lean, MD  I think this guy is yours  Thanks SK    ----- Message ----- From: Lavone Neri Lab Results In Sent: 01/05/2022   5:36 PM EST To: Deboraha Sprang, MD

## 2022-03-02 NOTE — Addendum Note (Signed)
Addended by: Precious Gilding on: 03/02/2022 11:28 AM   Modules accepted: Orders

## 2022-03-02 NOTE — Telephone Encounter (Signed)
The patient has been notified of the result and verbalized understanding.  All questions (if any) were answered. Riley Person Edahi Kroening, RN 03/02/2022 11:19 AM   Pt to have [place] on 06/01/22.

## 2022-03-08 ENCOUNTER — Other Ambulatory Visit: Payer: Self-pay | Admitting: Family Medicine

## 2022-03-08 DIAGNOSIS — M1A9XX0 Chronic gout, unspecified, without tophus (tophi): Secondary | ICD-10-CM

## 2022-03-08 MED ORDER — ALLOPURINOL 100 MG PO TABS
100.0000 mg | ORAL_TABLET | Freq: Every day | ORAL | 1 refills | Status: DC
  Filled 2022-03-08: qty 30, 30d supply, fill #0
  Filled 2022-04-12: qty 30, 30d supply, fill #1

## 2022-03-09 ENCOUNTER — Other Ambulatory Visit: Payer: Self-pay

## 2022-03-19 LAB — HM DIABETES EYE EXAM

## 2022-03-21 ENCOUNTER — Ambulatory Visit: Payer: Medicaid Other | Admitting: Nurse Practitioner

## 2022-03-25 IMAGING — CT CT ABD-PELV W/ CM
2 of 5 series · 15 of 46 positions shown, 17 images · IV contrast (omnipaque)
Comparison: August 03, 2019.

CLINICAL DATA: Acute generalized abdominal pain.

EXAM:
CT ABDOMEN AND PELVIS WITH CONTRAST
TECHNIQUE: Multidetector CT imaging of the abdomen and pelvis was performed
using the standard protocol following bolus administration of
intravenous contrast.
CONTRAST:  100mL OMNIPAQUE IOHEXOL 300 MG/ML  SOLN

[Series 2: axial st · axial · 0.87mm/px · z∈[+1071,+1511]mm · 12 of 104 slices shown, 14 images]
[im 8/104  soft-tissue]
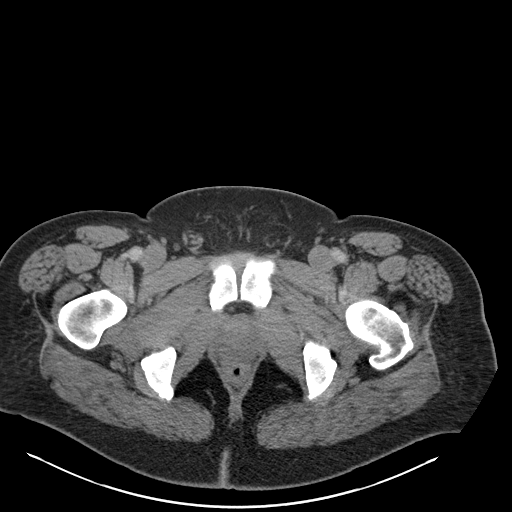
[im 8/104  bone]
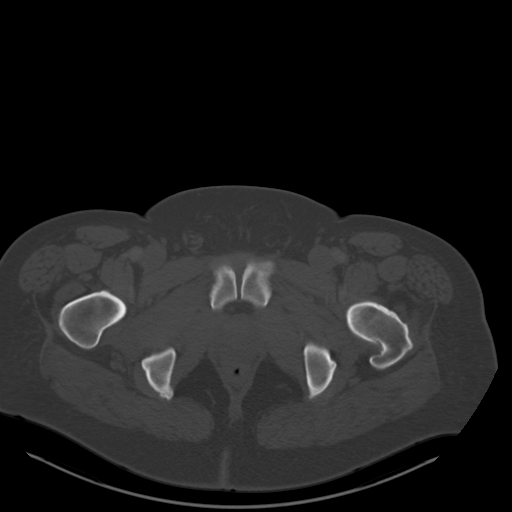
[im 16/104  soft-tissue]
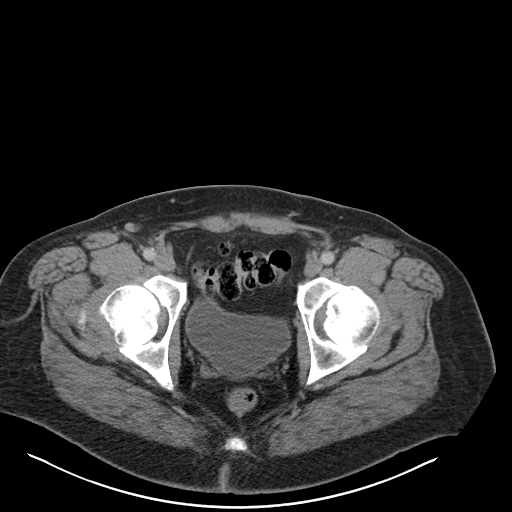
[im 24/104  soft-tissue]
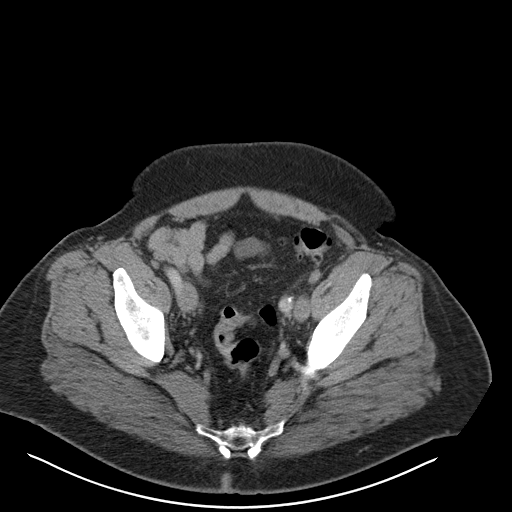
[im 32/104  soft-tissue]
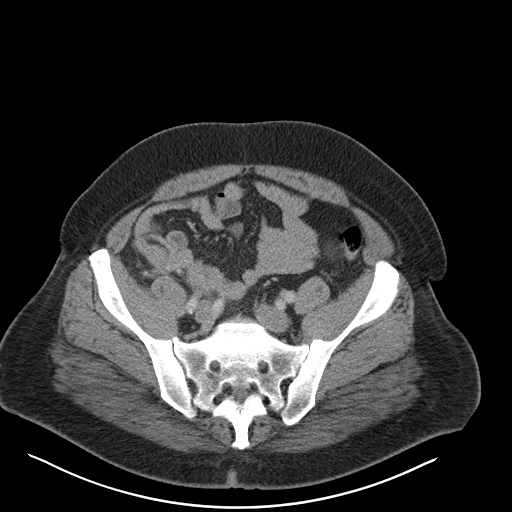
[im 40/104  soft-tissue]
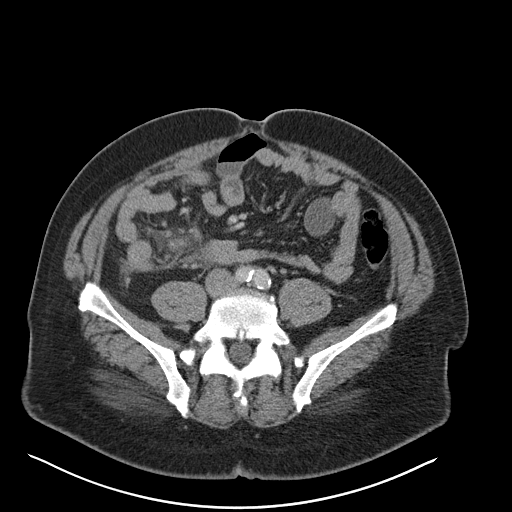
[im 48/104  soft-tissue]
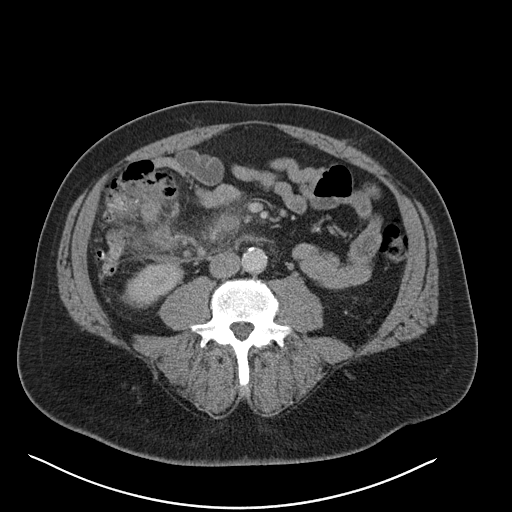
[im 56/104  soft-tissue]
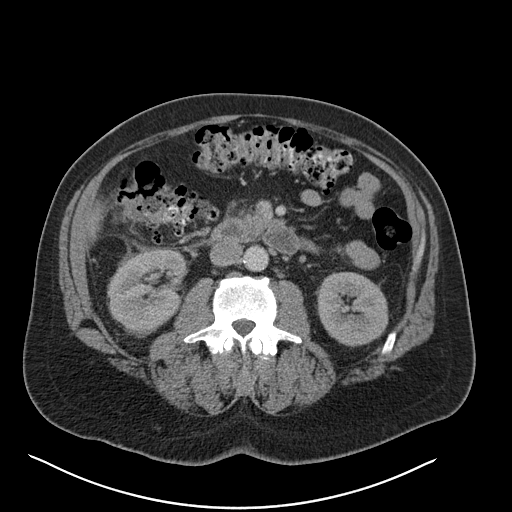
[im 64/104  soft-tissue]
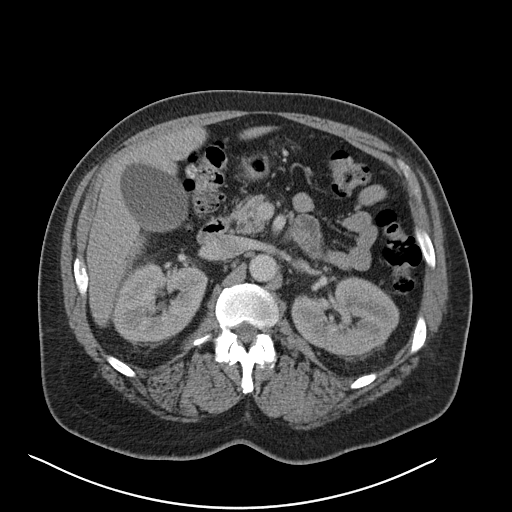
[im 72/104  soft-tissue]
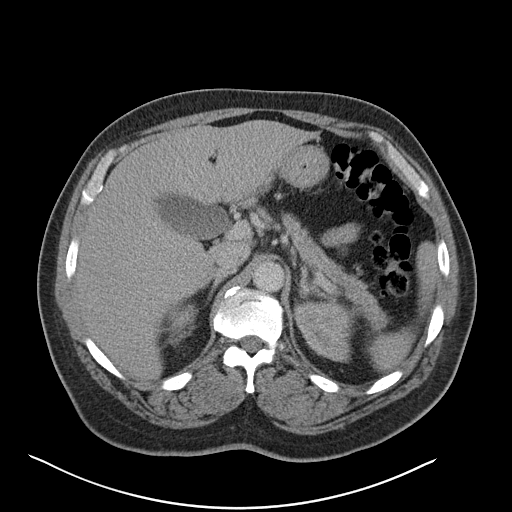
[im 72/104  bone]
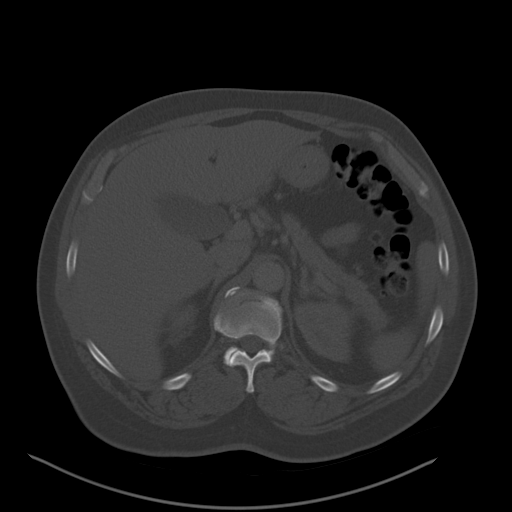
[im 80/104  soft-tissue]
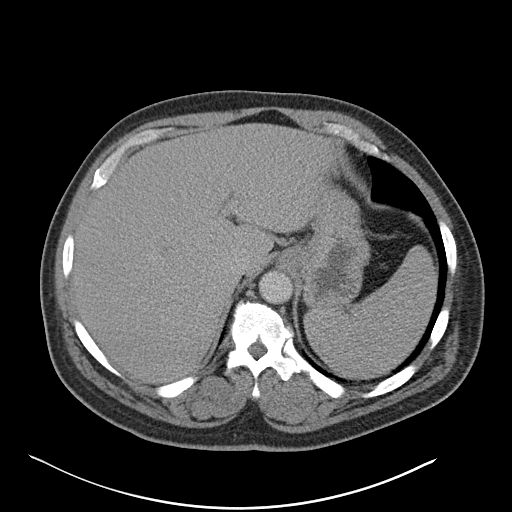
[im 88/104  soft-tissue]
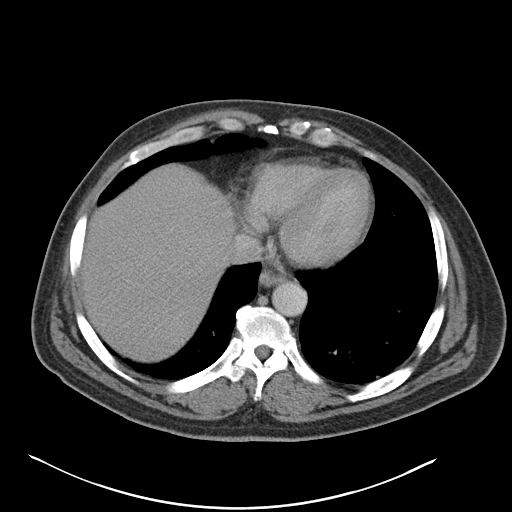
[im 96/104  soft-tissue]
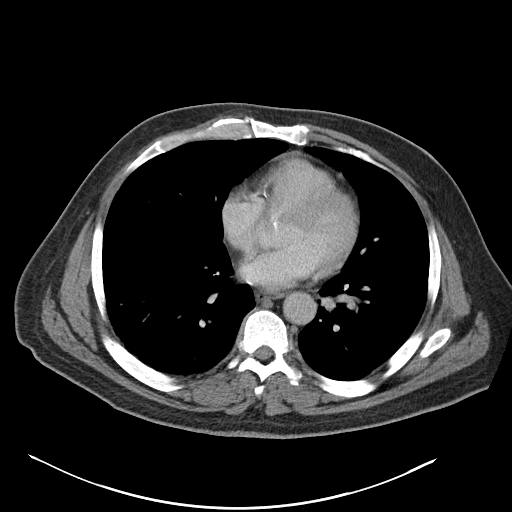

[Series 5: coronal st · coronal · 0.98mm/px · 3 of 175 slices shown]
[im 59/175  soft-tissue]
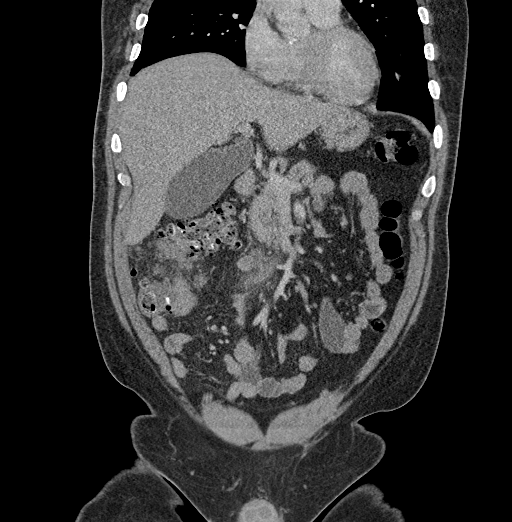
[im 78/175  soft-tissue]
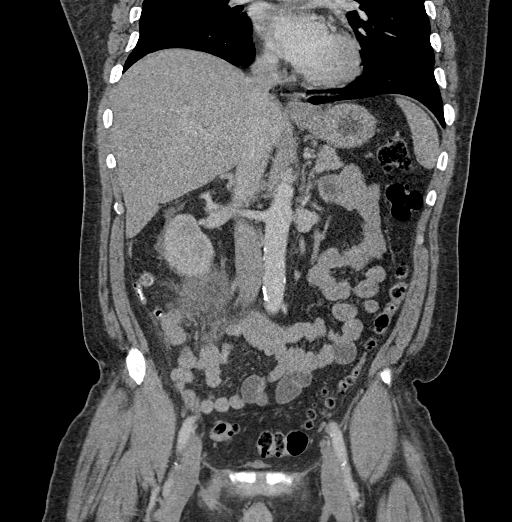
[im 97/175  soft-tissue]
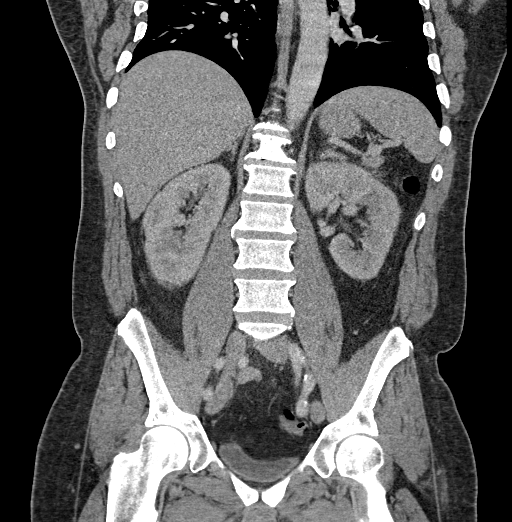

[15 of 46 positions shown; findings below may reference images not displayed]

FINDINGS: Lower chest: No acute abnormality.

Hepatobiliary: No focal liver abnormality is seen. No gallstones,
gallbladder wall thickening, or biliary dilatation.

Pancreas: Unremarkable. No pancreatic ductal dilatation or
surrounding inflammatory changes.

Spleen: Normal in size without focal abnormality.

Adrenals/Urinary Tract: Adrenal glands appear normal. Small
nonobstructive right renal calculus is noted. No hydronephrosis or
renal obstruction is noted. Urinary bladder is unremarkable.

Stomach/Bowel: Stomach is within normal limits. Appendix appears
normal. No evidence of bowel wall thickening, distention, or
inflammatory changes.

Vascular/Lymphatic: There appears to be acute thrombosis involving a
branch of the superior mesenteric vein seen in the right lower
quadrant, with surrounding inflammatory changes. There are enlarged
mesenteric lymph nodes in this area as well. Atherosclerosis of
abdominal aorta is noted without aneurysm formation.

Reproductive: Prostate is unremarkable.

Other: No abdominal wall hernia or abnormality. No abdominopelvic
ascites.

Musculoskeletal: No acute or significant osseous findings.
IMPRESSION: 1. There appears to be acute thrombosis involving a branch of the
superior mesenteric vein seen in the right lower quadrant, with
surrounding inflammatory changes. There are enlarged mesenteric
lymph nodes in this area as well. Critical Value/emergent results
were called by telephone at the time of interpretation on 03/12/2020
at [DATE] to provider KARAH CAMPOS , who verbally acknowledged these
results.
2. Small nonobstructive right renal calculus. No hydronephrosis or
renal obstruction is noted.
3. Aortic atherosclerosis.

Aortic Atherosclerosis ([id number]).

## 2022-03-25 IMAGING — US US ABDOMEN LIMITED
1 series · 14 of 25 positions shown · non-contrast
Comparison: September 02, 2019

CLINICAL DATA: Generalized abdominal pain for 3-4 days.

EXAM:
ULTRASOUND ABDOMEN LIMITED RIGHT UPPER QUADRANT

[Series 1: us abdomen limited · 14 of 32 slices shown]
[im 1/32]
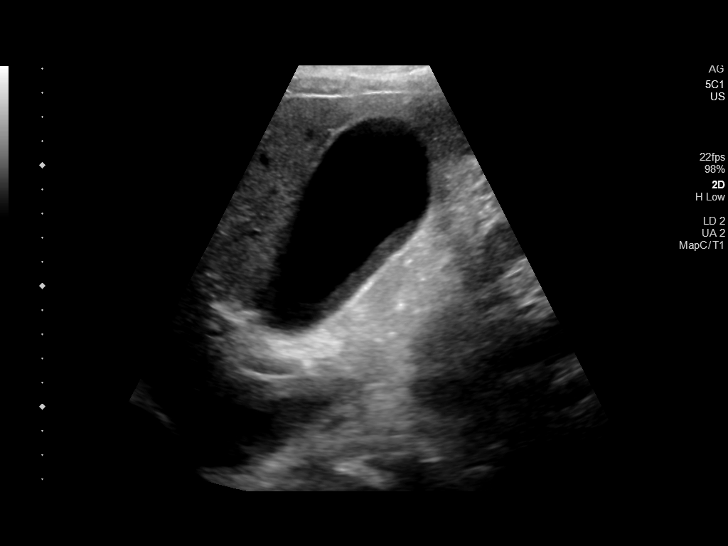
[im 3/32]
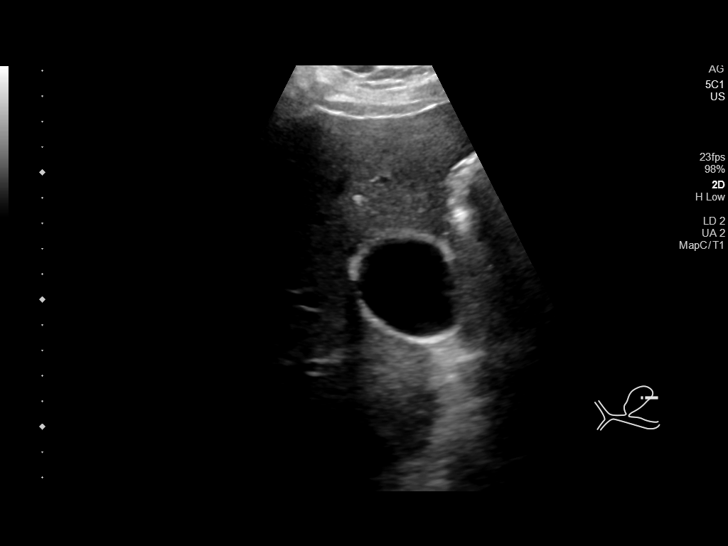
[im 6/32]
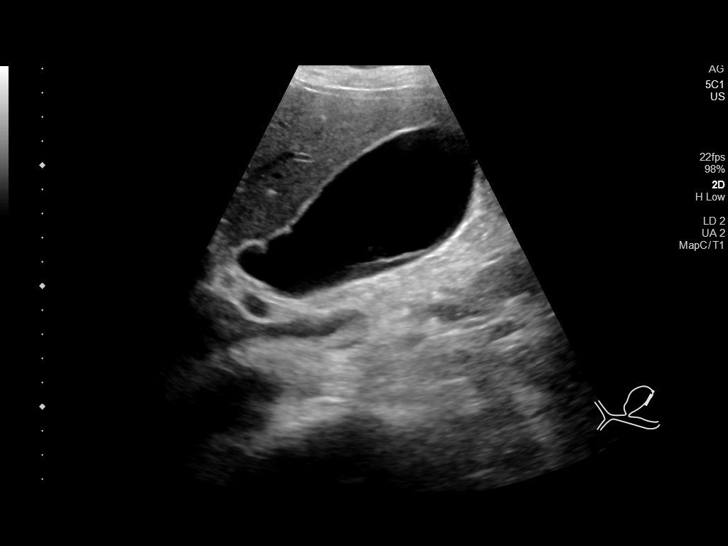
[im 8/32]
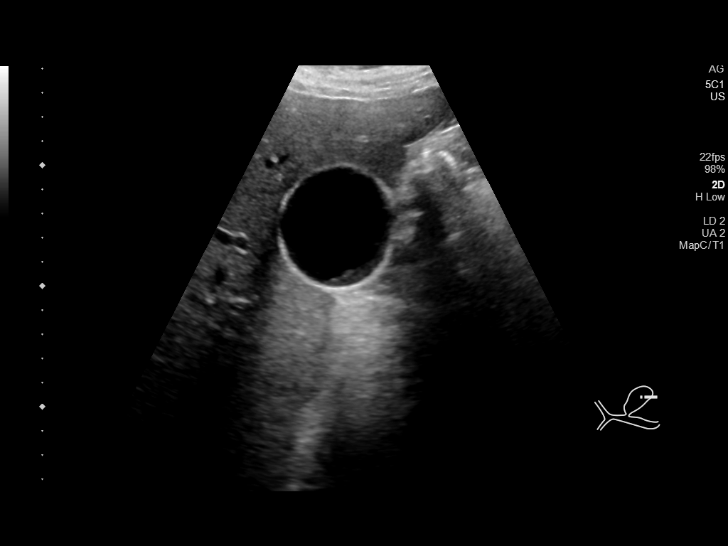
[im 11/32]
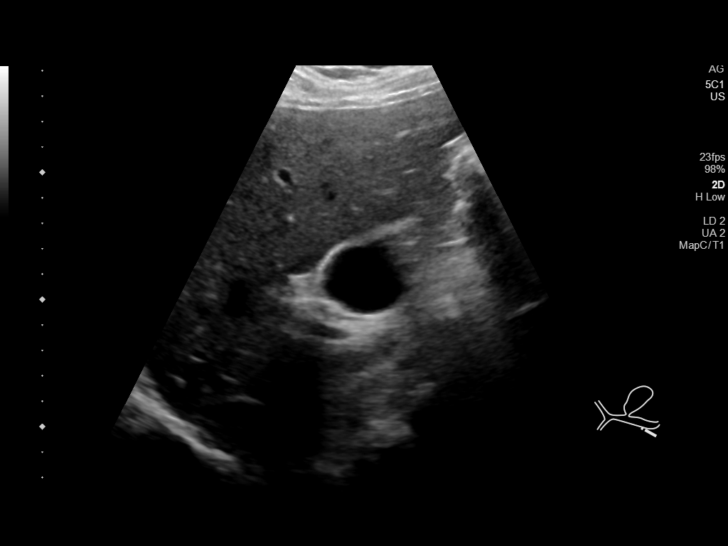
[im 12/32]
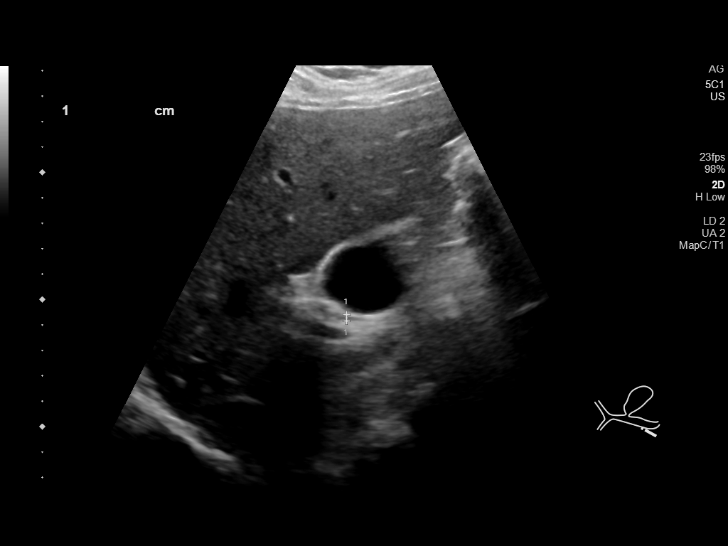
[im 15/32]
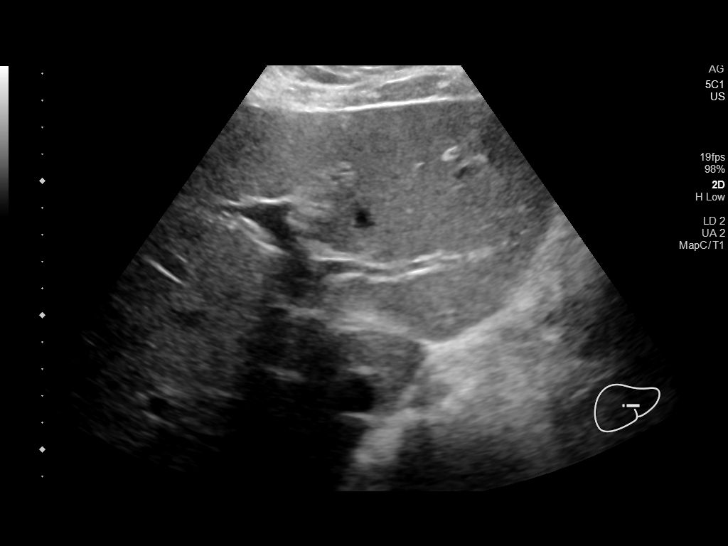
[im 17/32]
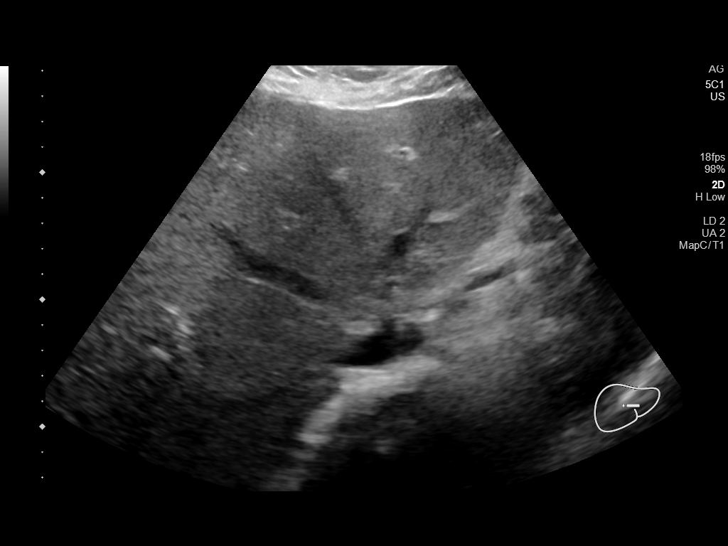
[im 20/32]
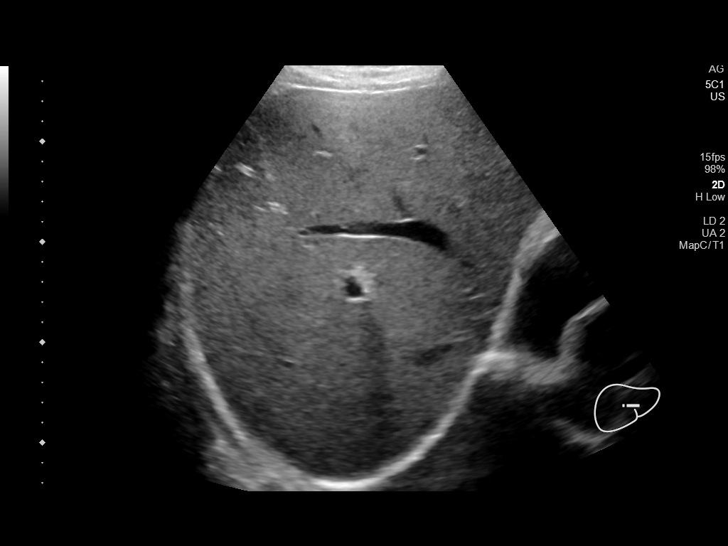
[im 21/32]
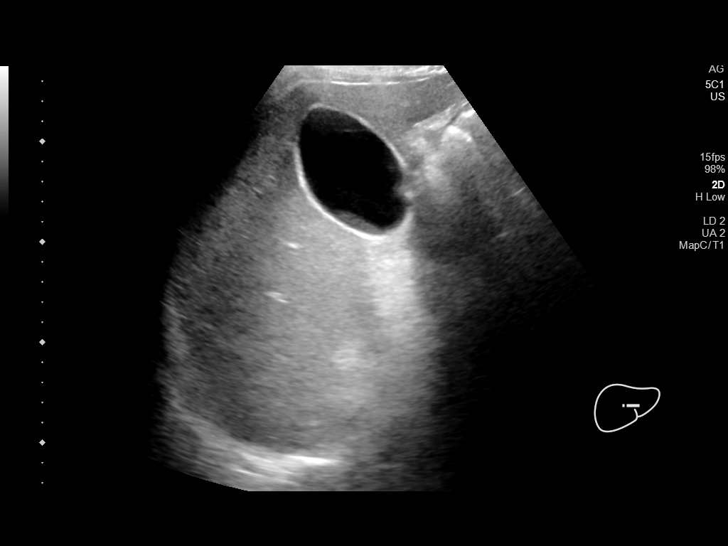
[im 24/32]
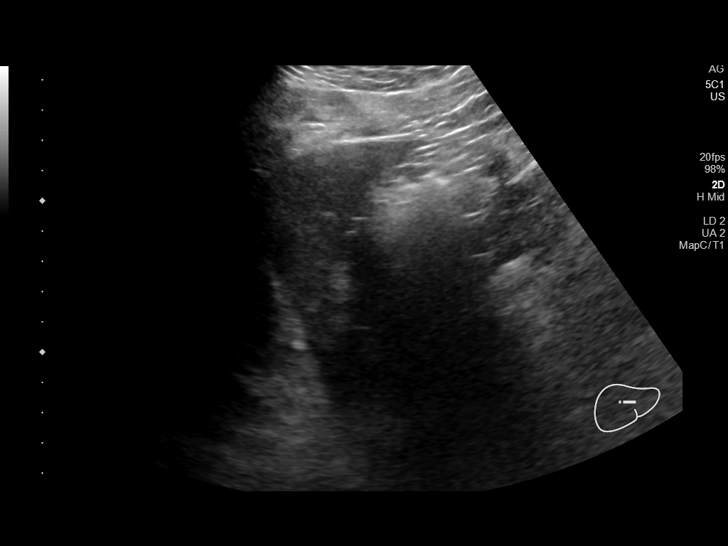
[im 26/32]
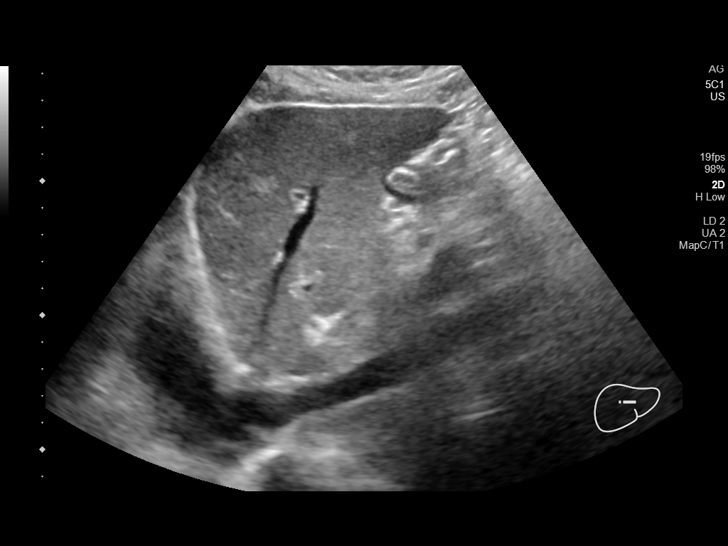
[im 29/32]
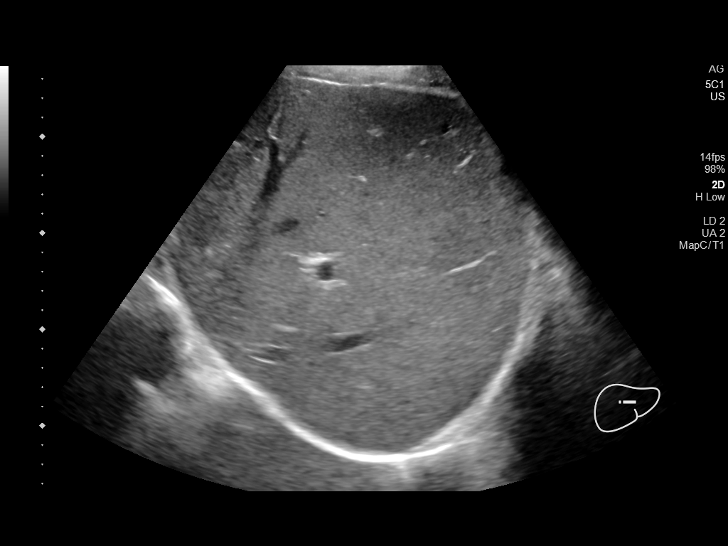
[im 32/32]
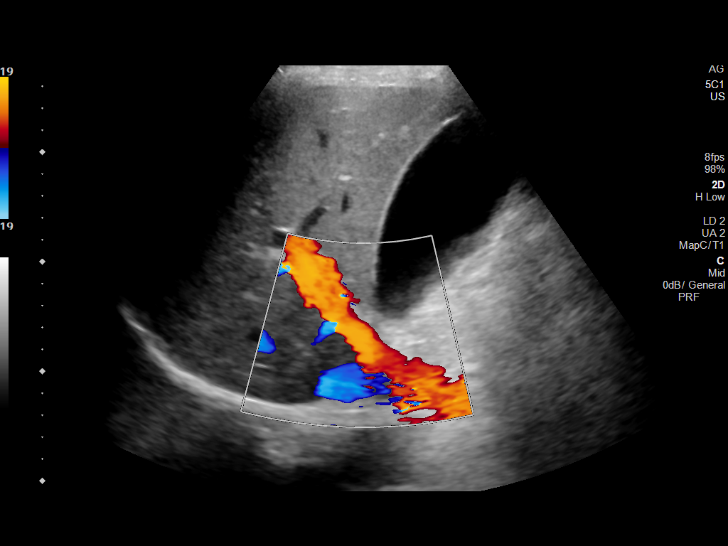

[14 of 25 positions shown; findings below may reference images not displayed]

FINDINGS: Gallbladder:

No gallstones or wall thickening visualized. Layering gallbladder
sludge. No sonographic Murphy sign noted by sonographer.

Common bile duct:

Diameter: 3 mm

Liver:

No focal lesion identified. Heterogeneous parenchymal echogenicity.
Portal vein is patent on color Doppler imaging with normal direction
of blood flow towards the liver.

Other: None.
IMPRESSION: Layering gallbladder sludge, without sonographic evidence of acute
cholecystitis.

Heterogeneous echotexture of the liver, usually seen with hepatic
steatosis.

## 2022-04-07 ENCOUNTER — Other Ambulatory Visit: Payer: Self-pay | Admitting: Family Medicine

## 2022-04-07 DIAGNOSIS — E1165 Type 2 diabetes mellitus with hyperglycemia: Secondary | ICD-10-CM

## 2022-04-09 ENCOUNTER — Other Ambulatory Visit: Payer: Self-pay

## 2022-04-09 MED ORDER — TRULICITY 3 MG/0.5ML ~~LOC~~ SOAJ
3.0000 mg | SUBCUTANEOUS | 3 refills | Status: DC
  Filled 2022-04-09: qty 2, 28d supply, fill #0

## 2022-04-13 ENCOUNTER — Other Ambulatory Visit: Payer: Self-pay

## 2022-04-17 ENCOUNTER — Other Ambulatory Visit: Payer: Self-pay | Admitting: Nurse Practitioner

## 2022-04-17 ENCOUNTER — Other Ambulatory Visit: Payer: Self-pay

## 2022-04-17 ENCOUNTER — Encounter: Payer: Self-pay | Admitting: Nurse Practitioner

## 2022-04-17 MED ORDER — VALSARTAN-HYDROCHLOROTHIAZIDE 160-25 MG PO TABS
1.0000 | ORAL_TABLET | Freq: Every day | ORAL | 3 refills | Status: DC
  Filled 2022-04-17: qty 30, 30d supply, fill #0
  Filled 2022-05-12: qty 30, 30d supply, fill #1
  Filled 2022-06-18: qty 30, 30d supply, fill #2
  Filled 2022-07-14 – 2022-07-16 (×2): qty 30, 30d supply, fill #3
  Filled 2022-08-14: qty 30, 30d supply, fill #4
  Filled 2022-09-02 – 2022-09-07 (×2): qty 30, 30d supply, fill #5
  Filled 2022-09-19 – 2022-10-04 (×2): qty 30, 30d supply, fill #6
  Filled 2022-10-21 – 2022-12-03 (×3): qty 30, 30d supply, fill #7

## 2022-05-01 ENCOUNTER — Ambulatory Visit: Payer: Medicaid Other | Attending: Nurse Practitioner | Admitting: Nurse Practitioner

## 2022-05-01 ENCOUNTER — Other Ambulatory Visit: Payer: Self-pay

## 2022-05-01 VITALS — BP 133/84 | HR 90 | Ht 73.0 in | Wt 274.0 lb

## 2022-05-01 DIAGNOSIS — M1A9XX0 Chronic gout, unspecified, without tophus (tophi): Secondary | ICD-10-CM

## 2022-05-01 DIAGNOSIS — E1165 Type 2 diabetes mellitus with hyperglycemia: Secondary | ICD-10-CM

## 2022-05-01 DIAGNOSIS — I10 Essential (primary) hypertension: Secondary | ICD-10-CM

## 2022-05-01 DIAGNOSIS — K55069 Acute infarction of intestine, part and extent unspecified: Secondary | ICD-10-CM | POA: Diagnosis not present

## 2022-05-01 LAB — POCT GLYCOSYLATED HEMOGLOBIN (HGB A1C): Hemoglobin A1C: 8 % — AB (ref 4.0–5.6)

## 2022-05-01 MED ORDER — TRULICITY 4.5 MG/0.5ML ~~LOC~~ SOAJ
4.5000 mg | SUBCUTANEOUS | 1 refills | Status: DC
  Filled 2022-05-01: qty 2, 28d supply, fill #0
  Filled 2022-06-18: qty 2, 28d supply, fill #1

## 2022-05-01 MED ORDER — APIXABAN 5 MG PO TABS
5.0000 mg | ORAL_TABLET | Freq: Two times a day (BID) | ORAL | 6 refills | Status: DC
  Filled 2022-05-01 – 2022-05-12 (×2): qty 60, 30d supply, fill #0
  Filled 2022-06-18: qty 60, 30d supply, fill #1
  Filled 2022-07-14 – 2022-07-16 (×2): qty 60, 30d supply, fill #2
  Filled 2022-08-14: qty 60, 30d supply, fill #3
  Filled 2022-11-11: qty 60, 30d supply, fill #4

## 2022-05-01 MED ORDER — ALLOPURINOL 100 MG PO TABS
100.0000 mg | ORAL_TABLET | Freq: Every day | ORAL | 1 refills | Status: DC
  Filled 2022-05-01: qty 90, 90d supply, fill #0
  Filled 2022-05-12: qty 30, 30d supply, fill #0
  Filled 2022-06-18: qty 30, 30d supply, fill #1
  Filled 2022-07-14 – 2022-07-16 (×2): qty 30, 30d supply, fill #2
  Filled 2022-08-14: qty 30, 30d supply, fill #3
  Filled 2022-09-02 – 2022-09-07 (×2): qty 30, 30d supply, fill #4
  Filled 2022-09-19 – 2022-10-04 (×2): qty 30, 30d supply, fill #5

## 2022-05-01 MED ORDER — INSULIN GLARGINE SOLOSTAR 100 UNIT/ML ~~LOC~~ SOPN
14.0000 [IU] | PEN_INJECTOR | Freq: Two times a day (BID) | SUBCUTANEOUS | 99 refills | Status: DC
  Filled 2022-05-01: qty 9, 30d supply, fill #0
  Filled 2022-06-18 (×2): qty 9, 30d supply, fill #1
  Filled 2022-07-14 – 2022-07-16 (×2): qty 9, 30d supply, fill #2

## 2022-05-01 MED ORDER — DAPAGLIFLOZIN PROPANEDIOL 10 MG PO TABS
10.0000 mg | ORAL_TABLET | Freq: Every day | ORAL | 1 refills | Status: DC
  Filled 2022-05-01: qty 90, 90d supply, fill #0
  Filled 2022-05-12: qty 30, 30d supply, fill #0
  Filled 2022-06-18: qty 30, 30d supply, fill #1
  Filled 2022-07-14 – 2022-07-16 (×2): qty 30, 30d supply, fill #2
  Filled 2022-08-14: qty 30, 30d supply, fill #3
  Filled 2022-09-02 – 2022-09-19 (×2): qty 30, 30d supply, fill #4
  Filled 2022-10-04 – 2022-10-21 (×2): qty 30, 30d supply, fill #5

## 2022-05-01 NOTE — Progress Notes (Signed)
Assessment & Plan:  Riley Wiggins was seen today for diabetes and hypertension.  Diagnoses and all orders for this visit:  Type 2 diabetes mellitus with hyperglycemia, without long-term current use of insulin (HCC) Trulicity increased to 4.5 today -     POCT glycosylated hemoglobin (Hb A1C) -     dapagliflozin propanediol (FARXIGA) 10 MG TABS tablet; Take 1 tablet (10 mg total) by mouth daily before breakfast. -     Dulaglutide (TRULICITY) 4.5 0000000 SOPN; Inject 4.5 mg as directed once a week. -     Insulin Glargine Solostar (LANTUS) 100 UNIT/ML Solostar Pen; Inject 14 Units into the skin 2 (two) times daily. -     CMP14+EGFR  Primary hypertension Continue all antihypertensives as prescribed.  Reminded to bring in blood pressure log for follow  up appointment.  RECOMMENDATIONS: DASH/Mediterranean Diets are healthier choices for HTN.    Chronic gout without tophus, unspecified cause, unspecified site -     allopurinol (ZYLOPRIM) 100 MG tablet; Take 1 tablet (100 mg total) by mouth daily.  Superior mesenteric artery thrombosis (HCC) -     apixaban (ELIQUIS) 5 MG TABS tablet; Take 1 tablet (5 mg total) by mouth 2 (two) times daily.    Patient has been counseled on age-appropriate routine health concerns for screening and prevention. These are reviewed and up-to-date. Referrals have been placed accordingly. Immunizations are up-to-date or declined.    Subjective:   Chief Complaint  Patient presents with   Diabetes   Hypertension   HPI Joseh Mazmanian 57 y.o. male presents to office today for follow up to HTN and DM  Past medical history of HTN, T2DM, superior mesenteric vein thrombosis, and dyslipidemia   Followed BY Cardiology   HTN Blood pressure is at goal with Diovan HCT 160-25 mg daily. BP Readings from Last 3 Encounters:  05/01/22 133/84  12/19/21 138/82  11/07/21 (!) 169/91      DM 2 Not at goal.  Instead of taking Basaglar 14 units twice daily he is taking 20 units  in am 8 in the evening along with Farxiga 10 mg daily and Trulicity 3 mg weekly.  He endorses adherence with taking all medications. Lab Results  Component Value Date   HGBA1C 8.0 (A) 05/01/2022  LDL not quite at goal with high intensity statin.  He was recently started on Zetia by cardiology and endorses adherence. Lab Results  Component Value Date   LDLCALC 97 01/05/2022     Review of Systems  Constitutional:  Negative for fever, malaise/fatigue and weight loss.  HENT: Negative.  Negative for nosebleeds.   Eyes: Negative.  Negative for blurred vision, double vision and photophobia.  Respiratory: Negative.  Negative for cough and shortness of breath.   Cardiovascular: Negative.  Negative for chest pain, palpitations and leg swelling.  Gastrointestinal: Negative.  Negative for heartburn, nausea and vomiting.  Musculoskeletal: Negative.  Negative for myalgias.  Neurological: Negative.  Negative for dizziness, focal weakness, seizures and headaches.  Psychiatric/Behavioral: Negative.  Negative for suicidal ideas.     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     Past Surgical History:  Procedure Laterality Date   HERNIA REPAIR     " when I was young "    Family History  Problem Relation Age of Onset   Diabetes Mother    Diabetes Father    Pancreatitis Neg Hx    Colon cancer Neg Hx    Stomach cancer Neg Hx     Social History Reviewed with no changes to be made today.   Outpatient Medications Prior to Visit  Medication Sig Dispense Refill   acetaminophen (TYLENOL) 500 MG tablet Take 1,000 mg by mouth every 6 (six) hours as needed for mild pain.     Ascorbic Acid (VITAMIN C) 1000 MG tablet Take 1,000 mg by mouth daily.     atorvastatin  (LIPITOR) 80 MG tablet Take 1 tablet (80 mg total) by mouth daily. 90 tablet 3   Blood Glucose Monitoring Suppl (TRUE METRIX METER) w/Device KIT Use to check blood sugar TID. 1 kit 0   ezetimibe (ZETIA) 10 MG tablet Take 1 tablet (10 mg total) by mouth daily. 90 tablet 3   Garlic 0000000 MG CAPS Take 1,200 mg by mouth daily.     Insulin Pen Needle (TECHLITE PEN NEEDLES) 31G X 5 MM MISC Use as instructed. Inject into the skin once daily. 100 each 3   Multiple Vitamins-Minerals (MULTI FOR HIM 50+) TABS Take 1 tablet by mouth daily.     valsartan-hydrochlorothiazide (DIOVAN-HCT) 160-25 MG tablet Take 1 tablet by mouth daily. 90 tablet 3   allopurinol (ZYLOPRIM) 100 MG tablet Take 1 tablet (100 mg total) by mouth daily. 90 tablet 1   apixaban (ELIQUIS) 5 MG TABS tablet Take 1 tablet (5 mg total) by mouth 2 (two) times daily. 60 tablet 6   dapagliflozin propanediol (FARXIGA) 10 MG TABS tablet Take 1 tablet (10 mg total) by mouth daily before breakfast. 90 tablet 1   Dulaglutide (TRULICITY) 3 0000000 SOPN Inject 3 mg as directed once a week. 2 mL 3   Insulin Glargine (BASAGLAR KWIKPEN) 100 UNIT/ML Inject 14 Units into the skin 2 (two) times daily. (Patient taking differently: Inject 8-10 Units into the skin See admin instructions. Taking 8 units in the AM and 10 units in the PM) 15 mL PRN   No facility-administered medications prior to visit.    No Known Allergies     Objective:    BP 133/84   Pulse 90   Ht 6\' 1"  (1.854 m)   Wt 274 lb (124.3 kg)   SpO2 97%   BMI 36.15 kg/m  Wt Readings from Last 3 Encounters:  05/01/22 274 lb (124.3 kg)  12/19/21 276 lb 9.6 oz (125.5 kg)  11/07/21 275 lb 9.6 oz (125 kg)    Physical Exam Vitals and nursing note reviewed.  Constitutional:      Appearance: He is well-developed.  HENT:     Head: Normocephalic and atraumatic.  Cardiovascular:     Rate and Rhythm: Normal rate and regular rhythm.     Heart sounds: Normal heart sounds. No murmur heard.     No friction rub. No gallop.  Pulmonary:     Effort: Pulmonary effort is normal. No tachypnea or respiratory distress.     Breath sounds: Normal breath sounds. No decreased breath sounds, wheezing, rhonchi or rales.  Chest:     Chest wall: No tenderness.  Abdominal:     General: Bowel sounds are normal.     Palpations: Abdomen is soft.  Musculoskeletal:        General: Normal range of motion.     Cervical back: Normal range of motion.  Skin:    General: Skin is warm  and dry.  Neurological:     Mental Status: He is alert and oriented to person, place, and time.     Coordination: Coordination normal.  Psychiatric:        Behavior: Behavior normal. Behavior is cooperative.        Thought Content: Thought content normal.        Judgment: Judgment normal.          Patient has been counseled extensively about nutrition and exercise as well as the importance of adherence with medications and regular follow-up. The patient was given clear instructions to go to ER or return to medical center if symptoms don't improve, worsen or new problems develop. The patient verbalized understanding.   Follow-up: Return in about 3 months (around 08/01/2022).   Gildardo Pounds, FNP-BC [facility] [place], [place]   05/01/2022, 1:17 PM

## 2022-05-01 NOTE — Progress Notes (Signed)
No concerns Bp monitor

## 2022-05-02 LAB — CMP14+EGFR
ALT: 23 IU/L (ref 0–44)
AST: 22 IU/L (ref 0–40)
Albumin/Globulin Ratio: 1.7 (ref 1.2–2.2)
Albumin: 4.8 g/dL (ref 3.8–4.9)
Alkaline Phosphatase: 83 IU/L (ref 44–121)
BUN/Creatinine Ratio: 19 (ref 9–20)
BUN: 17 mg/dL (ref 6–24)
Bilirubin Total: 0.7 mg/dL (ref 0.0–1.2)
CO2: 23 mmol/L (ref 20–29)
Calcium: 10.6 mg/dL — ABNORMAL HIGH (ref 8.7–10.2)
Chloride: 97 mmol/L (ref 96–106)
Creatinine, Ser: 0.9 mg/dL (ref 0.76–1.27)
Globulin, Total: 2.8 g/dL (ref 1.5–4.5)
Glucose: 146 mg/dL — ABNORMAL HIGH (ref 70–99)
Potassium: 4.2 mmol/L (ref 3.5–5.2)
Sodium: 141 mmol/L (ref 134–144)
Total Protein: 7.6 g/dL (ref 6.0–8.5)
eGFR: 100 mL/min/{1.73_m2} (ref >59)

## 2022-05-04 ENCOUNTER — Other Ambulatory Visit: Payer: Self-pay

## 2022-05-13 ENCOUNTER — Other Ambulatory Visit: Payer: Self-pay | Admitting: Nurse Practitioner

## 2022-05-13 ENCOUNTER — Encounter: Payer: Self-pay | Admitting: Nurse Practitioner

## 2022-05-13 MED ORDER — BLOOD PRESSURE MONITOR DEVI: 0 refills | Status: DC

## 2022-05-14 ENCOUNTER — Other Ambulatory Visit: Payer: Self-pay

## 2022-05-18 ENCOUNTER — Other Ambulatory Visit: Payer: Self-pay

## 2022-05-21 ENCOUNTER — Other Ambulatory Visit: Payer: Self-pay

## 2022-06-01 ENCOUNTER — Ambulatory Visit: Payer: Commercial Managed Care - HMO | Attending: Internal Medicine

## 2022-06-01 DIAGNOSIS — E1169 Type 2 diabetes mellitus with other specified complication: Secondary | ICD-10-CM

## 2022-06-01 DIAGNOSIS — I700 Atherosclerosis of aorta: Secondary | ICD-10-CM

## 2022-06-01 DIAGNOSIS — E785 Hyperlipidemia, unspecified: Secondary | ICD-10-CM | POA: Diagnosis not present

## 2022-06-02 LAB — LIPID PANEL
Chol/HDL Ratio: 2.9 ratio (ref 0.0–5.0)
Cholesterol, Total: 152 mg/dL (ref 100–199)
HDL: 52 mg/dL (ref >39)
LDL Chol Calc (NIH): 63 mg/dL (ref 0–99)
Triglycerides: 231 mg/dL — ABNORMAL HIGH (ref 0–149)
VLDL Cholesterol Cal: 37 mg/dL (ref 5–40)

## 2022-06-18 ENCOUNTER — Other Ambulatory Visit: Payer: Self-pay

## 2022-06-19 ENCOUNTER — Other Ambulatory Visit: Payer: Self-pay

## 2022-06-20 ENCOUNTER — Other Ambulatory Visit: Payer: Self-pay

## 2022-06-20 ENCOUNTER — Encounter: Payer: Self-pay | Admitting: Nurse Practitioner

## 2022-06-20 ENCOUNTER — Other Ambulatory Visit: Payer: Self-pay | Admitting: Nurse Practitioner

## 2022-06-20 DIAGNOSIS — E1165 Type 2 diabetes mellitus with hyperglycemia: Secondary | ICD-10-CM

## 2022-06-20 MED ORDER — OZEMPIC (0.25 OR 0.5 MG/DOSE) 2 MG/3ML ~~LOC~~ SOPN
0.5000 mg | PEN_INJECTOR | SUBCUTANEOUS | 1 refills | Status: DC
  Filled 2022-06-20: qty 3, 28d supply, fill #0
  Filled 2022-07-14 – 2022-07-16 (×2): qty 3, 28d supply, fill #1

## 2022-06-21 ENCOUNTER — Other Ambulatory Visit: Payer: Self-pay

## 2022-07-08 ENCOUNTER — Other Ambulatory Visit: Payer: Self-pay | Admitting: Family Medicine

## 2022-07-09 ENCOUNTER — Other Ambulatory Visit: Payer: Self-pay | Admitting: Pharmacist

## 2022-07-09 ENCOUNTER — Other Ambulatory Visit: Payer: Self-pay

## 2022-07-09 MED ORDER — ACCU-CHEK GUIDE W/DEVICE KIT
PACK | 0 refills | Status: DC
  Filled 2022-07-09 – 2022-07-14 (×2): qty 1, fill #0

## 2022-07-09 MED ORDER — ACCU-CHEK SOFTCLIX LANCETS MISC
3 refills | Status: DC
  Filled 2022-07-09: qty 100, 33d supply, fill #0
  Filled 2022-07-14: qty 100, fill #0
  Filled 2022-10-04: qty 100, 34d supply, fill #0

## 2022-07-09 MED ORDER — ACCU-CHEK GUIDE VI STRP
ORAL_STRIP | 3 refills | Status: DC
  Filled 2022-07-09 – 2022-07-14 (×2): qty 100, 33d supply, fill #0

## 2022-07-09 MED ORDER — TRUE METRIX METER W/DEVICE KIT
PACK | 0 refills | Status: DC
  Filled 2022-07-09: qty 1, fill #0

## 2022-07-09 NOTE — Telephone Encounter (Signed)
Requested Prescriptions  Pending Prescriptions Disp Refills   Blood Glucose Monitoring Suppl (TRUE METRIX METER) w/Device KIT 1 kit 0    Sig: Use to check blood sugar TID.     Endocrinology: Diabetes - Testing Supplies Passed - 07/08/2022  8:07 AM      Passed - Valid encounter within last 12 months    Recent Outpatient Visits           2 months ago Type 2 diabetes mellitus with hyperglycemia, without long-term current use of insulin [facility])   [place] [facility] [place], [place] W, NP   6 months ago Type 2 diabetes mellitus with hyperglycemia, without long-term current use of insulin [facility])   [place] [facility] Claiborne Rigg, NP   8 months ago Hospital discharge follow-up   [facility] Claiborne Rigg, NP   11 months ago Primary hypertension   [place] [facility] [place], [place] W, NP   1 year ago Type 2 diabetes mellitus with hyperglycemia, without long-term current use of insulin [facility])   [facility] [facility] Drucilla Chalet, RPH-CPP       Future Appointments             In 1 week Lois Huxley, Cornelius Moras, RPH-CPP [facility]   In 4 weeks Claiborne Rigg, NP [facility]

## 2022-07-10 ENCOUNTER — Other Ambulatory Visit: Payer: Self-pay

## 2022-07-10 ENCOUNTER — Other Ambulatory Visit: Payer: Self-pay | Admitting: Pharmacist

## 2022-07-10 MED ORDER — FREESTYLE LIBRE 3 SENSOR MISC
3 refills | Status: AC
  Filled 2022-07-10: qty 2, 28d supply, fill #0
  Filled 2022-08-14: qty 2, 28d supply, fill #1
  Filled 2022-09-19: qty 2, 28d supply, fill #2

## 2022-07-10 MED ORDER — FREESTYLE LIBRE 3 READER DEVI
1.0000 | 0 refills | Status: DC
  Filled 2022-07-10: qty 1, 30d supply, fill #0

## 2022-07-14 ENCOUNTER — Other Ambulatory Visit: Payer: Self-pay

## 2022-07-16 ENCOUNTER — Other Ambulatory Visit: Payer: Self-pay

## 2022-07-16 ENCOUNTER — Ambulatory Visit: Payer: Commercial Managed Care - HMO | Attending: Family Medicine | Admitting: Pharmacist

## 2022-07-16 ENCOUNTER — Encounter: Payer: Self-pay | Admitting: Pharmacist

## 2022-07-16 DIAGNOSIS — Z7985 Long-term (current) use of injectable non-insulin antidiabetic drugs: Secondary | ICD-10-CM

## 2022-07-16 DIAGNOSIS — E1165 Type 2 diabetes mellitus with hyperglycemia: Secondary | ICD-10-CM | POA: Diagnosis not present

## 2022-07-16 DIAGNOSIS — Z7984 Long term (current) use of oral hypoglycemic drugs: Secondary | ICD-10-CM | POA: Diagnosis not present

## 2022-07-16 DIAGNOSIS — Z794 Long term (current) use of insulin: Secondary | ICD-10-CM | POA: Diagnosis not present

## 2022-07-16 MED ORDER — SEMAGLUTIDE (1 MG/DOSE) 4 MG/3ML ~~LOC~~ SOPN
1.0000 mg | PEN_INJECTOR | SUBCUTANEOUS | 1 refills | Status: DC
  Filled 2022-07-16 – 2022-08-03 (×3): qty 3, 28d supply, fill #0
  Filled 2022-08-14 – 2022-09-10 (×3): qty 3, 28d supply, fill #1

## 2022-07-16 MED ORDER — INSULIN GLARGINE SOLOSTAR 100 UNIT/ML ~~LOC~~ SOPN
28.0000 [IU] | PEN_INJECTOR | Freq: Every day | SUBCUTANEOUS | 1 refills | Status: DC
Start: 2022-07-16 — End: 2022-10-22
  Filled 2022-07-16: qty 9, 30d supply, fill #0
  Filled 2022-08-14 (×2): qty 9, 30d supply, fill #1
  Filled 2022-09-02 – 2022-09-07 (×2): qty 9, 30d supply, fill #2
  Filled 2022-09-19: qty 3, 10d supply, fill #3
  Filled 2022-10-04 – 2022-10-21 (×2): qty 9, 30d supply, fill #3

## 2022-07-16 NOTE — Progress Notes (Signed)
S:    PCP: Bertram Denver, NP  57 y.o. male who presents for diabetes evaluation, education, and management.  PMH is significant for HTN, aortic atherosclerosis, and DM.  Patient was referred and last seen by Primary Care Provider, Bertram Denver, NP, on 06/20/2022.   At last visit, patient sent a message stating he was unable to fill Trulicity due to stock issues. He was then switched to Ozempmic 0.5 mg weekly.    Today, patient arrives in good spirits and presents without any assistance. Reports he has tolerated Ozempic well without issues. BG slightly elevated, FBG 160-180s.   Patient reports Diabetes was diagnosed in 2021.   Current diabetes medications include: Ozempic 0.5 mg weekly (Saturday), Lantus 20 units QAM and 8 units QPM, Farxiga 10 mg daily Current hypertension medications include: valsartan-HCTZ 160-25 mg daily Current hyperlipidemia medications include: atorvastatin 80 mg daily, ezetimibe 10 mg daily  Patient reports adherence to taking all medications as prescribed.   Insurance coverage: [employer]  Patient denies hypoglycemic events.  Reported home fasting blood sugars: 150-164  Reported 2 hour post-meal/random blood sugars: 180s mostly, some 200s.  Patient denies nocturia (nighttime urination).  Patient denies neuropathy (nerve pain). Patient denies visual changes. Patient reports self foot exams.   Patient reported dietary habits: Eats 2-3 meals/day Breakfast: sometimes skips, bananas Lunch: sometimes skips, eats a hot dog Dinner: usually largest meal Drinks: diet Mt. Dew, flavored packet   O:   7 day average blood glucose: 177 14 day average: 181  Lab Results  Component Value Date   HGBA1C 8.0 (A) 05/01/2022   There were no vitals filed for this visit.  Lipid Panel     Component Value Date/Time   CHOL 152 06/01/2022 0735   TRIG 231 (H) 06/01/2022 0735   HDL 52 06/01/2022 0735   CHOLHDL 2.9 06/01/2022 0735   CHOLHDL 4.4 08/03/2019 2005    VLDL UNABLE TO CALCULATE IF TRIGLYCERIDE OVER 400 mg/dL 41/32/4401 0272   LDLCALC 63 06/01/2022 0735   LDLDIRECT 57.0 08/03/2019 2005    Clinical Atherosclerotic Cardiovascular Disease (ASCVD): Yes  The 10-year ASCVD risk score (Arnett DK, et al., 2019) is: 33.6%   Values used to calculate the score:     Age: 44 years     Sex: Male     Is Non-Hispanic African American: Yes     Diabetic: Yes     Tobacco smoker: Yes     Systolic Blood Pressure: 133 mmHg     Is BP treated: Yes     HDL Cholesterol: 52 mg/dL     Total Cholesterol: 152 mg/dL   Patient is participating in a Managed Medicaid Plan:  Yes   A/P: Diabetes longstanding, currently uncontrolled based on A1c (8%). Patient is able to verbalize appropriate hypoglycemia management plan. Medication adherence appears appropriate. He has tolerated Ozempic well without issue. Will consolidate insulin dose to reduce number of injections.  -Consolidated Lantus from 14 units BID to 28 units daily in the morning.  -Increased dose of Ozempic (semaglutide) to 1 mg weekly after 4 doses of 0.5 mg. Patient has tolerated the change from Trulicity to Ozempic without issue.  -Continued SGLT2-I Farxiga (dapagliflozin) 10 mg daily.  -Patient educated on purpose, proper use, and potential adverse effects of Ozempic.  -Extensively discussed pathophysiology of diabetes, recommended lifestyle interventions, dietary effects on blood sugar control.  -Counseled on s/sx of and management of hypoglycemia.  -Next A1c anticipated July 2024.   Written patient instructions provided. Patient verbalized  understanding of treatment plan.  Total time in face to face counseling 30 minutes.    Follow-up:  Pharmacist PRN. PCP clinic visit on 08/07/2022.   Valeda Malm, Pharm.D. PGY-2 Ambulatory Care Pharmacy Resident

## 2022-07-17 ENCOUNTER — Other Ambulatory Visit: Payer: Self-pay

## 2022-07-18 ENCOUNTER — Other Ambulatory Visit: Payer: Self-pay

## 2022-07-24 ENCOUNTER — Other Ambulatory Visit: Payer: Self-pay

## 2022-07-27 ENCOUNTER — Other Ambulatory Visit: Payer: Self-pay

## 2022-08-03 ENCOUNTER — Other Ambulatory Visit: Payer: Self-pay

## 2022-08-07 ENCOUNTER — Encounter: Payer: Self-pay | Admitting: Nurse Practitioner

## 2022-08-07 ENCOUNTER — Ambulatory Visit: Payer: Commercial Managed Care - HMO | Attending: Nurse Practitioner | Admitting: Nurse Practitioner

## 2022-08-07 ENCOUNTER — Other Ambulatory Visit: Payer: Self-pay

## 2022-08-07 VITALS — BP 144/84 | HR 80 | Ht 73.0 in | Wt 268.8 lb

## 2022-08-07 DIAGNOSIS — I10 Essential (primary) hypertension: Secondary | ICD-10-CM | POA: Diagnosis not present

## 2022-08-07 DIAGNOSIS — R1084 Generalized abdominal pain: Secondary | ICD-10-CM | POA: Diagnosis not present

## 2022-08-07 DIAGNOSIS — Z794 Long term (current) use of insulin: Secondary | ICD-10-CM | POA: Diagnosis not present

## 2022-08-07 DIAGNOSIS — E1165 Type 2 diabetes mellitus with hyperglycemia: Secondary | ICD-10-CM

## 2022-08-07 LAB — POCT GLYCOSYLATED HEMOGLOBIN (HGB A1C): Hemoglobin A1C: 8.4 % — AB (ref 4.0–5.6)

## 2022-08-07 LAB — GLUCOSE, POCT (MANUAL RESULT ENTRY): POC Glucose: 185 mg/dl — AB (ref 70–99)

## 2022-08-07 MED ORDER — TECHLITE PEN NEEDLES 31G X 5 MM MISC
3 refills | Status: DC
Start: 2022-08-07 — End: 2023-10-20
  Filled 2022-08-07 – 2022-08-14 (×2): qty 100, 100d supply, fill #0
  Filled 2022-09-19: qty 100, 30d supply, fill #0
  Filled 2022-11-11: qty 100, 30d supply, fill #1
  Filled 2023-04-11: qty 100, 30d supply, fill #2
  Filled 2023-05-23: qty 100, 30d supply, fill #3

## 2022-08-07 NOTE — Progress Notes (Signed)
Assessment & Plan:  Riley Wiggins was seen today for diabetes and hypertension.  Diagnoses and all orders for this visit:  Type 2 diabetes mellitus with hyperglycemia, with long-term current use of insulin (HCC) -     POCT glycosylated hemoglobin (Hb A1C) -     POCT glucose (manual entry) -     Insulin Pen Needle (TECHLITE PEN NEEDLES) 31G X 5 MM MISC; Use as instructed. Inject into the skin once daily. -     CMP14+EGFR  Generalized abdominal pain -     Amylase -     Lipase  Primary hypertension Continue all antihypertensives as prescribed.  Reminded to bring in blood pressure log for follow  up appointment.  RECOMMENDATIONS: DASH/Mediterranean Diets are healthier choices for HTN.     Patient has been counseled on age-appropriate routine health concerns for screening and prevention. These are reviewed and up-to-date. Referrals have been placed accordingly. Immunizations are up-to-date or declined.    Subjective:   Chief Complaint  Patient presents with   Diabetes   Hypertension    Riley Wiggins 57 y.o. male presents to office today for DM and HTN.  Past medical history of HTN, T2DM, superior mesenteric vein thrombosis, and dyslipidemia   Followed BY Cardiology  Diabetes is not well-controlled.  He was prescribed Ozempic 1 mg weekly several weeks ago however unfortunately he has not been able to obtain this until recently and will be administering the new dosage on Saturday.  Reports blood sugars are higher at home since switching from Trulicity to Ozempic.  Notes more cravings.  He is also prescribed dapagliflozin 10 mg daily and Lantus 28 units daily. He does endorse an episode of abdominal pain a few weeks ago. Stating it felt like his previous pancreatic flare however he states pain has currently resolved.  Lab Results  Component Value Date   HGBA1C 8.4 (A) 08/07/2022    24Lab Results  Component Value Date   HGBA1C 8.0 (A) 05/01/2022     Blood pressure is well controlled  with Diovan HCT 160-25 mg daily BP Readings from Last 3 Encounters:  08/07/22 120/81  05/01/22 133/84  12/19/21 138/82     Review of Systems  Constitutional:  Negative for fever, malaise/fatigue and weight loss.  HENT: Negative.  Negative for nosebleeds.   Eyes: Negative.  Negative for blurred vision, double vision and photophobia.  Respiratory: Negative.  Negative for cough and shortness of breath.   Cardiovascular: Negative.  Negative for chest pain, palpitations and leg swelling.  Gastrointestinal: Negative.  Negative for heartburn, nausea and vomiting.  Musculoskeletal: Negative.  Negative for myalgias.  Neurological: Negative.  Negative for dizziness, focal weakness, seizures and headaches.  Psychiatric/Behavioral: Negative.  Negative for suicidal ideas.     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     Past Surgical History:  Procedure Laterality Date   HERNIA REPAIR     " when I was young "    Family History  Problem Relation Age of Onset   Diabetes Mother    Diabetes Father    Pancreatitis Neg Hx    Colon cancer Neg Hx    Stomach cancer Neg Hx     Social History Reviewed with no changes to be made today.  Outpatient Medications Prior to Visit  Medication Sig Dispense Refill   Accu-Chek Softclix Lancets lancets Use to check blood sugar three times daily. 100 each 3   acetaminophen (TYLENOL) 500 MG tablet Take 1,000 mg by mouth every 6 (six) hours as needed for mild pain.     allopurinol (ZYLOPRIM) 100 MG tablet Take 1 tablet (100 mg total) by mouth daily. 90 tablet 1   apixaban (ELIQUIS) 5 MG TABS tablet Take 1 tablet (5 mg total) by mouth 2 (two) times daily. 60 tablet 6   Ascorbic Acid (VITAMIN C) 1000 MG tablet Take 1,000 mg  by mouth daily.     atorvastatin (LIPITOR) 80 MG tablet Take 1 tablet (80 mg total) by mouth daily. 90 tablet 3   Blood Glucose Monitoring Suppl (ACCU-CHEK GUIDE) w/Device KIT Use to check blood sugar three times daily. 1 kit 0   Blood Pressure Monitor DEVI Please provide patient with insurance approved blood pressure monitor 1 each 0   Continuous Glucose Receiver (FREESTYLE LIBRE 3 READER) DEVI Apply route continuous. 1 each 0   Continuous Glucose Sensor (FREESTYLE LIBRE 3 SENSOR) MISC Place 1 sensor on the skin every 14 days. Use to check glucose continuously. 2 each 3   dapagliflozin propanediol (FARXIGA) 10 MG TABS tablet Take 1 tablet (10 mg total) by mouth daily before breakfast. 90 tablet 1   ezetimibe (ZETIA) 10 MG tablet Take 1 tablet (10 mg total) by mouth daily. 90 tablet 3   Garlic 1200 MG CAPS Take 1,200 mg by mouth daily.     glucose blood (ACCU-CHEK GUIDE) test strip Use to check blood sugar three times daily. 100 each 3   Insulin Glargine Solostar (LANTUS) 100 UNIT/ML Solostar Pen Inject 28 Units into the skin daily. 15 mL 1   Multiple Vitamins-Minerals (MULTI FOR HIM 50+) TABS Take 1 tablet by mouth daily.     Semaglutide, 1 MG/DOSE, 4 MG/3ML SOPN Inject 1 mg as directed once a week. 3 mL 1   valsartan-hydrochlorothiazide (DIOVAN-HCT) 160-25 MG tablet Take 1 tablet by mouth daily. 90 tablet 3   Insulin Pen Needle (TECHLITE PEN NEEDLES) 31G X 5 MM MISC Use as instructed. Inject into the skin once daily. 100 each 3   No facility-administered medications prior to visit.    No Known Allergies     Objective:    BP (!) 144/84 (BP Location: Left Arm, Patient Position: Sitting, Cuff Size: Large)   Pulse 80   Ht 6\' 1"  (1.854 m)   Wt 268 lb 12.8 oz (121.9 kg)   SpO2 100%   BMI 35.46 kg/m  Wt Readings from Last 3 Encounters:  08/07/22 268 lb 12.8 oz (121.9 kg)  05/01/22 274 lb (124.3 kg)  12/19/21 276 lb 9.6 oz (125.5 kg)    Physical Exam Vitals and nursing note reviewed.   Constitutional:      Appearance: He is well-developed.  HENT:     Head: Normocephalic and atraumatic.  Cardiovascular:     Rate and Rhythm: Normal rate and regular rhythm.     Heart sounds: Normal heart sounds. No murmur heard.    No friction rub. No gallop.  Pulmonary:     Effort: Pulmonary effort is normal. No tachypnea or respiratory distress.     Breath sounds: Normal breath sounds. No decreased breath sounds, wheezing, rhonchi or rales.  Chest:     Chest wall: No tenderness.  Abdominal:     General: Bowel sounds are normal.  Palpations: Abdomen is soft.  Musculoskeletal:        General: Normal range of motion.     Cervical back: Normal range of motion.  Skin:    General: Skin is warm and dry.  Neurological:     Mental Status: He is alert and oriented to person, place, and time.     Coordination: Coordination normal.  Psychiatric:        Behavior: Behavior normal. Behavior is cooperative.        Thought Content: Thought content normal.        Judgment: Judgment normal.          Patient has been counseled extensively about nutrition and exercise as well as the importance of adherence with medications and regular follow-up. The patient was given clear instructions to go to ER or return to medical center if symptoms don't improve, worsen or new problems develop. The patient verbalized understanding.   Follow-up: Return in about 3 months (around 11/07/2022).   Claiborne Rigg, FNP-BC [facility] [place], [place] [phone]   08/07/2022, 9:12 AM

## 2022-08-08 LAB — CMP14+EGFR
ALT: 22 IU/L (ref 0–44)
AST: 23 IU/L (ref 0–40)
Albumin: 4.7 g/dL (ref 3.8–4.9)
Alkaline Phosphatase: 82 IU/L (ref 44–121)
BUN/Creatinine Ratio: 19 (ref 9–20)
BUN: 18 mg/dL (ref 6–24)
Bilirubin Total: 0.3 mg/dL (ref 0.0–1.2)
CO2: 22 mmol/L (ref 20–29)
Calcium: 10.3 mg/dL — ABNORMAL HIGH (ref 8.7–10.2)
Chloride: 100 mmol/L (ref 96–106)
Creatinine, Ser: 0.95 mg/dL (ref 0.76–1.27)
Globulin, Total: 2.4 g/dL (ref 1.5–4.5)
Glucose: 168 mg/dL — ABNORMAL HIGH (ref 70–99)
Potassium: 4.1 mmol/L (ref 3.5–5.2)
Sodium: 139 mmol/L (ref 134–144)
Total Protein: 7.1 g/dL (ref 6.0–8.5)
eGFR: 93 mL/min/{1.73_m2} (ref >59)

## 2022-08-08 LAB — AMYLASE: Amylase: 95 U/L (ref 31–110)

## 2022-08-08 LAB — LIPASE: Lipase: 82 U/L — ABNORMAL HIGH (ref 13–78)

## 2022-08-14 ENCOUNTER — Other Ambulatory Visit: Payer: Self-pay

## 2022-08-14 ENCOUNTER — Other Ambulatory Visit: Payer: Self-pay | Admitting: Family Medicine

## 2022-08-14 MED ORDER — FREESTYLE LIBRE 3 READER DEVI
0 refills | Status: DC
  Filled 2022-08-14: qty 1, 28d supply, fill #0
  Filled 2022-09-02: qty 1, fill #0
  Filled 2022-09-19: qty 1, 30d supply, fill #0

## 2022-08-20 ENCOUNTER — Other Ambulatory Visit: Payer: Self-pay

## 2022-08-29 ENCOUNTER — Encounter: Payer: Self-pay | Admitting: Nurse Practitioner

## 2022-09-03 ENCOUNTER — Other Ambulatory Visit: Payer: Self-pay

## 2022-09-07 ENCOUNTER — Other Ambulatory Visit: Payer: Self-pay

## 2022-09-10 ENCOUNTER — Other Ambulatory Visit: Payer: Self-pay | Admitting: Nurse Practitioner

## 2022-09-10 ENCOUNTER — Other Ambulatory Visit: Payer: Self-pay

## 2022-09-10 MED ORDER — SILDENAFIL CITRATE 100 MG PO TABS
50.0000 mg | ORAL_TABLET | Freq: Every day | ORAL | 11 refills | Status: AC | PRN
  Filled 2022-09-10: qty 5, 15d supply, fill #0
  Filled 2022-09-19 – 2022-10-04 (×2): qty 5, 15d supply, fill #1
  Filled 2022-10-21: qty 5, 15d supply, fill #2
  Filled 2022-12-03: qty 5, 15d supply, fill #3
  Filled 2023-02-19: qty 5, 15d supply, fill #4

## 2022-09-19 ENCOUNTER — Other Ambulatory Visit: Payer: Self-pay

## 2022-09-19 ENCOUNTER — Other Ambulatory Visit: Payer: Self-pay | Admitting: Family Medicine

## 2022-09-19 MED ORDER — OZEMPIC (1 MG/DOSE) 4 MG/3ML ~~LOC~~ SOPN
1.0000 mg | PEN_INJECTOR | SUBCUTANEOUS | 1 refills | Status: DC
  Filled 2022-09-19 – 2022-10-04 (×2): qty 3, 28d supply, fill #0
  Filled 2022-10-21 – 2022-11-11 (×3): qty 3, 28d supply, fill #1

## 2022-09-24 ENCOUNTER — Other Ambulatory Visit: Payer: Self-pay

## 2022-10-04 ENCOUNTER — Other Ambulatory Visit: Payer: Self-pay

## 2022-10-05 ENCOUNTER — Other Ambulatory Visit: Payer: Self-pay | Admitting: Pharmacist

## 2022-10-05 ENCOUNTER — Other Ambulatory Visit: Payer: Self-pay

## 2022-10-05 MED ORDER — ONETOUCH VERIO VI STRP
ORAL_STRIP | 6 refills | Status: DC
  Filled 2022-10-05: qty 100, 30d supply, fill #0
  Filled 2022-11-01 – 2022-12-03 (×2): qty 100, 30d supply, fill #1
  Filled 2023-01-04: qty 100, 30d supply, fill #2
  Filled 2023-02-19 – 2023-05-02 (×2): qty 100, 30d supply, fill #3

## 2022-10-05 MED ORDER — ONETOUCH DELICA PLUS LANCET33G MISC
6 refills | Status: DC
  Filled 2022-10-05: qty 100, 30d supply, fill #0
  Filled 2022-11-11: qty 100, 30d supply, fill #1
  Filled 2023-01-04: qty 100, 30d supply, fill #2
  Filled 2023-03-02 – 2023-05-02 (×2): qty 100, 30d supply, fill #3

## 2022-10-05 MED ORDER — ONETOUCH VERIO W/DEVICE KIT
PACK | 0 refills | Status: DC
  Filled 2022-10-05: qty 1, 30d supply, fill #0

## 2022-10-13 ENCOUNTER — Other Ambulatory Visit: Payer: Self-pay | Admitting: Internal Medicine

## 2022-10-21 ENCOUNTER — Other Ambulatory Visit: Payer: Self-pay | Admitting: Nurse Practitioner

## 2022-10-21 DIAGNOSIS — M1A9XX0 Chronic gout, unspecified, without tophus (tophi): Secondary | ICD-10-CM

## 2022-10-22 ENCOUNTER — Other Ambulatory Visit: Payer: Self-pay

## 2022-10-22 ENCOUNTER — Other Ambulatory Visit: Payer: Self-pay | Admitting: Family Medicine

## 2022-10-22 DIAGNOSIS — E1165 Type 2 diabetes mellitus with hyperglycemia: Secondary | ICD-10-CM

## 2022-10-22 NOTE — Telephone Encounter (Signed)
Requested medications are due for refill today.  yes  Requested medications are on the active medications list.  yes  Last refill. 05/01/2022 #90 1 rf  Future visit scheduled.   yes  Notes to clinic.  Missing labs.    Requested Prescriptions  Pending Prescriptions Disp Refills   allopurinol (ZYLOPRIM) 100 MG tablet 90 tablet 1    Sig: Take 1 tablet (100 mg total) by mouth daily.     Endocrinology:  Gout Agents - allopurinol Failed - 10/21/2022  7:00 AM      Failed - Uric Acid in normal range and within 360 days    No results found for: "POCURA", "LABURIC"       Passed - Cr in normal range and within 360 days    Creatinine  Date Value Ref Range Status  05/10/2014 0.76 mg/dL Final    Comment:    1.61-0.96 NOTE: New Reference Range  04/13/14    Creatinine, Ser  Date Value Ref Range Status  08/07/2022 0.95 0.76 - 1.27 mg/dL Final         Passed - Valid encounter within last 12 months    Recent Outpatient Visits           2 months ago Type 2 diabetes mellitus with hyperglycemia, with long-term current use of insulin (HCC)   [place] [facility] [place], Shea Stakes, NP   3 months ago Type 2 diabetes mellitus with hyperglycemia, without long-term current use of insulin (HCC)   [place] [facility] [place], [employer] L, RPH-CPP   5 months ago Type 2 diabetes mellitus with hyperglycemia, without long-term current use of insulin (HCC)   [place] [facility] [place], [place] W, NP   10 months ago Type 2 diabetes mellitus with hyperglycemia, without long-term current use of insulin [facility])   [place] [facility] [place], Shea Stakes, NP   11 months ago Hospital discharge follow-up   [facility] [place], Shea Stakes, NP       Future Appointments             In 2 weeks Claiborne Rigg, NP [facility]             Passed - CBC within normal limits and completed in the last 12 months    WBC  Date Value Ref Range Status  11/07/2021 10.3 3.4 - 10.8 x10E3/uL Final  10/08/2021 17.3 (H) 4.0 - 10.5 K/uL Final   RBC  Date Value Ref Range Status  11/07/2021 4.65 4.14 - 5.80 x10E6/uL Final  10/08/2021 4.53 4.22 - 5.81 MIL/uL Final   Hemoglobin  Date Value Ref Range Status  11/07/2021 15.6 13.0 - 17.7 g/dL Final   Hematocrit  Date Value Ref Range Status  11/07/2021 45.1 37.5 - 51.0 % Final   MCHC  Date Value Ref Range Status  11/07/2021 34.6 31.5 - 35.7 g/dL Final  04/54/0981 19.1 30.0 - 36.0 g/dL Final   [facility]  Date Value Ref Range Status  11/07/2021 33.5 (H) 26.6 - 33.0 pg Final  10/08/2021 34.0 26.0 - 34.0 pg Final   MCV  Date Value Ref Range Status  11/07/2021 97 79 - 97 fL Final  05/10/2014 101 (H) 80 - 100 fL Final   No results found for: "PLTCOUNTKUC", "LABPLAT", "POCPLA" RDW  Date Value Ref Range Status  11/07/2021 13.0 11.6 - 15.4 %  Final  05/10/2014 12.9 11.5 - 14.5 % Final

## 2022-10-23 ENCOUNTER — Other Ambulatory Visit: Payer: Self-pay

## 2022-10-23 MED ORDER — INSULIN GLARGINE SOLOSTAR 100 UNIT/ML ~~LOC~~ SOPN
28.0000 [IU] | PEN_INJECTOR | Freq: Every day | SUBCUTANEOUS | 1 refills | Status: DC
  Filled 2022-10-23: qty 9, 30d supply, fill #0
  Filled 2022-12-03 – 2022-12-04 (×2): qty 9, 30d supply, fill #1

## 2022-10-23 MED ORDER — ALLOPURINOL 100 MG PO TABS
100.0000 mg | ORAL_TABLET | Freq: Every day | ORAL | 0 refills | Status: DC
Start: 2022-10-23 — End: 2022-12-11
  Filled 2022-10-23: qty 90, 90d supply, fill #0
  Filled 2022-11-01 – 2022-12-03 (×2): qty 30, 30d supply, fill #0

## 2022-10-23 NOTE — Telephone Encounter (Signed)
Requested Prescriptions  Pending Prescriptions Disp Refills   Insulin Glargine Solostar (LANTUS) 100 UNIT/ML Solostar Pen 15 mL 1    Sig: Inject 28 Units into the skin daily.     Endocrinology:  Diabetes - Insulins Failed - 10/22/2022  8:03 AM      Failed - HBA1C is between 0 and 7.9 and within 180 days    Hemoglobin A1C  Date Value Ref Range Status  08/07/2022 8.4 (A) 4.0 - 5.6 % Final   HbA1c, POC (controlled diabetic range)  Date Value Ref Range Status  12/19/2021 7.2 (A) 0.0 - 7.0 % Final         Passed - Valid encounter within last 6 months    Recent Outpatient Visits           2 months ago Type 2 diabetes mellitus with hyperglycemia, with long-term current use of insulin [facility])   [place] [facility] [place], [place] W, NP   3 months ago Type 2 diabetes mellitus with hyperglycemia, without long-term current use of insulin [facility])   [place] [facility] [place], [place] L, RPH-CPP   5 months ago Type 2 diabetes mellitus with hyperglycemia, without long-term current use of insulin [facility])   [place] [facility] [place], [place] W, NP   10 months ago Type 2 diabetes mellitus with hyperglycemia, without long-term current use of insulin [facility])   [place] [facility] [place], Shea Stakes, NP   11 months ago Hospital discharge follow-up   [facility] Claiborne Rigg, NP       Future Appointments             In 2 weeks Claiborne Rigg, NP [facility]

## 2022-10-24 ENCOUNTER — Other Ambulatory Visit: Payer: Self-pay

## 2022-11-01 ENCOUNTER — Other Ambulatory Visit: Payer: Self-pay | Admitting: Nurse Practitioner

## 2022-11-01 ENCOUNTER — Other Ambulatory Visit: Payer: Self-pay | Admitting: Internal Medicine

## 2022-11-01 ENCOUNTER — Other Ambulatory Visit: Payer: Self-pay

## 2022-11-01 DIAGNOSIS — E1165 Type 2 diabetes mellitus with hyperglycemia: Secondary | ICD-10-CM

## 2022-11-01 MED ORDER — DAPAGLIFLOZIN PROPANEDIOL 10 MG PO TABS
10.0000 mg | ORAL_TABLET | Freq: Every day | ORAL | 0 refills | Status: DC
Start: 2022-11-01 — End: 2022-12-03
  Filled 2022-11-01 – 2022-11-11 (×2): qty 30, 30d supply, fill #0

## 2022-11-02 ENCOUNTER — Other Ambulatory Visit: Payer: Self-pay

## 2022-11-05 ENCOUNTER — Other Ambulatory Visit: Payer: Self-pay

## 2022-11-07 ENCOUNTER — Ambulatory Visit: Payer: Commercial Managed Care - HMO | Admitting: Nurse Practitioner

## 2022-11-11 ENCOUNTER — Other Ambulatory Visit: Payer: Self-pay | Admitting: Internal Medicine

## 2022-11-12 ENCOUNTER — Other Ambulatory Visit: Payer: Self-pay

## 2022-11-14 ENCOUNTER — Other Ambulatory Visit: Payer: Self-pay

## 2022-11-14 MED ORDER — ATORVASTATIN CALCIUM 80 MG PO TABS
80.0000 mg | ORAL_TABLET | Freq: Every day | ORAL | 0 refills | Status: DC
  Filled 2022-11-14 – 2022-12-03 (×2): qty 15, 15d supply, fill #0

## 2022-11-26 ENCOUNTER — Other Ambulatory Visit: Payer: Self-pay

## 2022-12-03 ENCOUNTER — Other Ambulatory Visit: Payer: Self-pay | Admitting: Family Medicine

## 2022-12-03 ENCOUNTER — Other Ambulatory Visit: Payer: Self-pay

## 2022-12-03 DIAGNOSIS — E1165 Type 2 diabetes mellitus with hyperglycemia: Secondary | ICD-10-CM

## 2022-12-04 ENCOUNTER — Other Ambulatory Visit: Payer: Self-pay

## 2022-12-04 MED ORDER — DAPAGLIFLOZIN PROPANEDIOL 10 MG PO TABS
10.0000 mg | ORAL_TABLET | Freq: Every day | ORAL | 0 refills | Status: DC
  Filled 2022-12-04: qty 30, 30d supply, fill #0

## 2022-12-04 MED ORDER — OZEMPIC (1 MG/DOSE) 4 MG/3ML ~~LOC~~ SOPN
1.0000 mg | PEN_INJECTOR | SUBCUTANEOUS | 0 refills | Status: DC
  Filled 2022-12-04 – 2022-12-06 (×2): qty 3, 28d supply, fill #0

## 2022-12-04 NOTE — Telephone Encounter (Signed)
Requested medication (s) are due for refill today:   Yes for both  Requested medication (s) are on the active medication list:   Yes for both  Future visit scheduled:   Yes 11/5    Last ordered: Ozempic 09/19/2022 3 ml, 1 refill;   Marcelline Deist 11/01/2022 #30, 0 refills  Returned because Ozempic is not covered by insurance.   Alternative being requested.     Requested Prescriptions  Pending Prescriptions Disp Refills   Semaglutide, 1 MG/DOSE, (OZEMPIC, 1 MG/DOSE,) 4 MG/3ML SOPN 3 mL 1    Sig: Inject 1 mg as directed once a week.     Endocrinology:  Diabetes - GLP-1 Receptor Agonists - semaglutide Failed - 12/03/2022  4:44 PM      Failed - HBA1C in normal range and within 180 days    Hemoglobin A1C  Date Value Ref Range Status  08/07/2022 8.4 (A) 4.0 - 5.6 % Final   HbA1c, POC (controlled diabetic range)  Date Value Ref Range Status  12/19/2021 7.2 (A) 0.0 - 7.0 % Final         Passed - Cr in normal range and within 360 days    Creatinine  Date Value Ref Range Status  05/10/2014 0.76 mg/dL Final    Comment:    2.13-0.86 NOTE: New Reference Range  04/13/14    Creatinine, Ser  Date Value Ref Range Status  08/07/2022 0.95 0.76 - 1.27 mg/dL Final         Passed - Valid encounter within last 6 months    Recent Outpatient Visits           3 months ago Type 2 diabetes mellitus with hyperglycemia, with long-term current use of insulin (HCC)   [place] [facility] [place], Shea Stakes, NP   4 months ago Type 2 diabetes mellitus with hyperglycemia, without long-term current use of insulin (HCC)   [facility] [place], [place] L, RPH-CPP   7 months ago Type 2 diabetes mellitus with hyperglycemia, without long-term current use of insulin (HCC)   [place] [facility] [place], [place] W, NP   11 months ago Type 2 diabetes mellitus with hyperglycemia, without long-term current use of insulin [facility])    [place] [facility] [place], [place], NP   1 year ago Hospital discharge follow-up   [facility] [place], Shea Stakes, NP       Future Appointments             In 1 week Claiborne Rigg, NP [place] [facility]             dapagliflozin propanediol (FARXIGA) 10 MG TABS tablet 30 tablet 0    Sig: Take 1 tablet (10 mg total) by mouth daily before breakfast.     Endocrinology:  Diabetes - SGLT2 Inhibitors Failed - 12/03/2022  4:44 PM      Failed - HBA1C is between 0 and 7.9 and within 180 days    Hemoglobin A1C  Date Value Ref Range Status  08/07/2022 8.4 (A) 4.0 - 5.6 % Final   HbA1c, POC (controlled diabetic range)  Date Value Ref Range Status  12/19/2021 7.2 (A) 0.0 - 7.0 % Final         Passed - Cr in normal range and within 360 days    Creatinine  Date Value Ref Range Status  05/10/2014  0.76 mg/dL Final    Comment:    1.61-0.96 NOTE: New Reference Range  04/13/14    Creatinine, Ser  Date Value Ref Range Status  08/07/2022 0.95 0.76 - 1.27 mg/dL Final         Passed - eGFR in normal range and within 360 days    EGFR (African American)  Date Value Ref Range Status  05/10/2014 >60  Final   GFR calc Af Amer  Date Value Ref Range Status  03/17/2020 110 >59 mL/min/1.73 Final    Comment:    **In accordance with recommendations from the NKF-ASN Task force,**   [facility] is in the process of updating its eGFR calculation to the   2021 CKD-EPI creatinine equation that estimates kidney function   without a race variable.    EGFR (Non-African Amer.)  Date Value Ref Range Status  05/10/2014 >60  Final    Comment:    eGFR values <30mL/min/1.73 m2 may be an indication of chronic kidney disease (CKD). Calculated eGFR is useful in patients with stable renal function. The eGFR calculation will not be reliable in acutely ill patients when serum creatinine is changing rapidly. It is  not useful in patients on dialysis. The eGFR calculation may not be applicable to patients at the low and high extremes of body sizes, pregnant women, and vegetarians.    GFR, Estimated  Date Value Ref Range Status  10/09/2021 >60 >60 mL/min Final    Comment:    (NOTE) Calculated using the CKD-EPI Creatinine Equation (2021)    eGFR  Date Value Ref Range Status  08/07/2022 93 >59 mL/min/1.73 Final         Passed - Valid encounter within last 6 months    Recent Outpatient Visits           3 months ago Type 2 diabetes mellitus with hyperglycemia, with long-term current use of insulin [facility])   [place] [facility] [place], [place] W, NP   4 months ago Type 2 diabetes mellitus with hyperglycemia, without long-term current use of insulin [facility])   [place] [facility] [place], [place] L, RPH-CPP   7 months ago Type 2 diabetes mellitus with hyperglycemia, without long-term current use of insulin [facility])   [place] [facility] [place], [place] W, NP   11 months ago Type 2 diabetes mellitus with hyperglycemia, without long-term current use of insulin [facility])   [facility] [facility] [place], Shea Stakes, NP   1 year ago Hospital discharge follow-up   [facility] Claiborne Rigg, NP       Future Appointments             In 1 week Claiborne Rigg, NP [facility]

## 2022-12-05 ENCOUNTER — Other Ambulatory Visit: Payer: Self-pay

## 2022-12-06 ENCOUNTER — Other Ambulatory Visit: Payer: Self-pay

## 2022-12-10 ENCOUNTER — Other Ambulatory Visit: Payer: Self-pay

## 2022-12-11 ENCOUNTER — Encounter: Payer: Self-pay | Admitting: Nurse Practitioner

## 2022-12-11 ENCOUNTER — Other Ambulatory Visit: Payer: Self-pay

## 2022-12-11 ENCOUNTER — Ambulatory Visit: Payer: Managed Care, Other (non HMO) | Attending: Nurse Practitioner | Admitting: Nurse Practitioner

## 2022-12-11 VITALS — BP 138/84 | HR 86 | Ht 73.0 in | Wt 272.2 lb

## 2022-12-11 DIAGNOSIS — E1165 Type 2 diabetes mellitus with hyperglycemia: Secondary | ICD-10-CM

## 2022-12-11 DIAGNOSIS — Z794 Long term (current) use of insulin: Secondary | ICD-10-CM

## 2022-12-11 DIAGNOSIS — K55069 Acute infarction of intestine, part and extent unspecified: Secondary | ICD-10-CM

## 2022-12-11 DIAGNOSIS — D72829 Elevated white blood cell count, unspecified: Secondary | ICD-10-CM

## 2022-12-11 DIAGNOSIS — I700 Atherosclerosis of aorta: Secondary | ICD-10-CM

## 2022-12-11 DIAGNOSIS — I10 Essential (primary) hypertension: Secondary | ICD-10-CM | POA: Diagnosis not present

## 2022-12-11 DIAGNOSIS — M1A9XX0 Chronic gout, unspecified, without tophus (tophi): Secondary | ICD-10-CM

## 2022-12-11 LAB — POCT GLYCOSYLATED HEMOGLOBIN (HGB A1C): Hemoglobin A1C: 7.7 % — AB (ref 4.0–5.6)

## 2022-12-11 MED ORDER — ALLOPURINOL 100 MG PO TABS
100.0000 mg | ORAL_TABLET | Freq: Every day | ORAL | 1 refills | Status: DC
  Filled 2022-12-11: qty 90, 90d supply, fill #0
  Filled 2023-01-04: qty 30, 30d supply, fill #0
  Filled 2023-01-31: qty 30, 30d supply, fill #1
  Filled 2023-02-19 – 2023-03-02 (×2): qty 30, 30d supply, fill #2
  Filled 2023-03-14 – 2023-04-11 (×3): qty 30, 30d supply, fill #3

## 2022-12-11 MED ORDER — OZEMPIC (1 MG/DOSE) 4 MG/3ML ~~LOC~~ SOPN
1.0000 mg | PEN_INJECTOR | SUBCUTANEOUS | 1 refills | Status: DC
  Filled 2022-12-11: qty 9, 84d supply, fill #0
  Filled 2023-01-04: qty 3, 28d supply, fill #0
  Filled 2023-01-31: qty 3, 28d supply, fill #1
  Filled 2023-02-22: qty 3, 28d supply, fill #2
  Filled 2023-03-02 – 2023-03-22 (×3): qty 3, 28d supply, fill #3
  Filled 2023-04-11 – 2023-05-12 (×3): qty 3, 28d supply, fill #4
  Filled 2023-06-09: qty 3, 28d supply, fill #5

## 2022-12-11 MED ORDER — EZETIMIBE 10 MG PO TABS
10.0000 mg | ORAL_TABLET | Freq: Every day | ORAL | 3 refills | Status: DC
  Filled 2022-12-11: qty 90, 90d supply, fill #0
  Filled 2023-01-04: qty 30, 30d supply, fill #0
  Filled 2023-01-31: qty 90, 90d supply, fill #0
  Filled 2023-02-19 – 2023-03-02 (×2): qty 30, 30d supply, fill #0
  Filled 2023-03-22: qty 30, 30d supply, fill #1

## 2022-12-11 MED ORDER — BASAGLAR KWIKPEN 100 UNIT/ML ~~LOC~~ SOPN
28.0000 [IU] | PEN_INJECTOR | Freq: Every day | SUBCUTANEOUS | 99 refills | Status: DC
  Filled 2022-12-11: qty 15, 53d supply, fill #0
  Filled 2023-01-04: qty 6, 21d supply, fill #0
  Filled 2023-01-31: qty 6, 21d supply, fill #1
  Filled 2023-02-22: qty 6, 21d supply, fill #2
  Filled 2023-03-02 – 2023-03-14 (×2): qty 6, 21d supply, fill #3
  Filled 2023-04-11 – 2023-04-12 (×2): qty 6, 21d supply, fill #4
  Filled 2023-05-02: qty 6, 21d supply, fill #5
  Filled 2023-05-23: qty 6, 21d supply, fill #6
  Filled 2023-06-27: qty 6, 21d supply, fill #7
  Filled 2023-07-15: qty 6, 21d supply, fill #8
  Filled 2023-08-10: qty 6, 21d supply, fill #9
  Filled 2023-09-06: qty 6, 21d supply, fill #10
  Filled 2023-09-29: qty 6, 21d supply, fill #11
  Filled 2023-10-20: qty 6, 21d supply, fill #12
  Filled 2023-11-20: qty 6, 21d supply, fill #13

## 2022-12-11 MED ORDER — DAPAGLIFLOZIN PROPANEDIOL 10 MG PO TABS
10.0000 mg | ORAL_TABLET | Freq: Every day | ORAL | 1 refills | Status: DC
  Filled 2022-12-11: qty 90, 90d supply, fill #0
  Filled 2023-01-04: qty 30, 30d supply, fill #0
  Filled 2023-01-31 – 2023-02-19 (×2): qty 30, 30d supply, fill #1
  Filled 2023-03-02 – 2023-03-14 (×2): qty 30, 30d supply, fill #2
  Filled 2023-04-11 – 2023-04-16 (×2): qty 30, 30d supply, fill #3

## 2022-12-11 MED ORDER — VALSARTAN-HYDROCHLOROTHIAZIDE 160-25 MG PO TABS
1.0000 | ORAL_TABLET | Freq: Every day | ORAL | 3 refills | Status: DC
  Filled 2022-12-11: qty 90, 90d supply, fill #0
  Filled 2023-01-04: qty 30, 30d supply, fill #0
  Filled 2023-01-31: qty 30, 30d supply, fill #1
  Filled 2023-02-19 – 2023-03-02 (×2): qty 30, 30d supply, fill #2
  Filled 2023-03-22 – 2023-04-11 (×2): qty 30, 30d supply, fill #3
  Filled 2023-05-02 – 2023-05-12 (×2): qty 30, 30d supply, fill #4
  Filled 2023-05-23 – 2023-06-27 (×4): qty 30, 30d supply, fill #5
  Filled 2023-07-15: qty 30, 30d supply, fill #6
  Filled 2023-08-10: qty 30, 30d supply, fill #7
  Filled 2023-09-29: qty 30, 30d supply, fill #8
  Filled 2023-10-20 – 2023-11-20 (×2): qty 30, 30d supply, fill #9

## 2022-12-11 MED ORDER — APIXABAN 5 MG PO TABS
5.0000 mg | ORAL_TABLET | Freq: Two times a day (BID) | ORAL | 2 refills | Status: DC
  Filled 2022-12-11 – 2023-01-04 (×2): qty 60, 30d supply, fill #0
  Filled 2023-05-02: qty 60, 30d supply, fill #1
  Filled 2023-06-27: qty 60, 30d supply, fill #2

## 2022-12-11 NOTE — Progress Notes (Signed)
Assessment & Plan:  Riley Wiggins was seen today for medical management of chronic issues.  Diagnoses and all orders for this visit:  Type 2 diabetes mellitus with hyperglycemia, with long-term current use of insulin  Needs better adherence with taking medications in a timely manner -     POCT glycosylated hemoglobin (Hb A1C) -     Urine Albumin/Creatinine with ratio (send out) [LAB689] -     CMP14+EGFR -     dapagliflozin propanediol (FARXIGA) 10 MG TABS tablet; Take 1 tablet (10 mg total) by mouth daily before breakfast. -     Insulin Glargine (BASAGLAR KWIKPEN) 100 UNIT/ML; Inject 28 Units into the skin daily. -     Semaglutide, 1 MG/DOSE, (OZEMPIC, 1 MG/DOSE,) 4 MG/3ML SOPN; Inject 1 mg as directed once a week.  Leukocytosis, unspecified type -     CBC with Differential  Superior mesenteric artery thrombosis (HCC) -     apixaban (ELIQUIS) 5 MG TABS tablet; Take 1 tablet (5 mg total) by mouth 2 (two) times daily.  Chronic gout without tophus, unspecified cause, unspecified site -     allopurinol (ZYLOPRIM) 100 MG tablet; Take 1 tablet (100 mg total) by mouth daily.  Aortic atherosclerosis (HCC) -     ezetimibe (ZETIA) 10 MG tablet; Take 1 tablet (10 mg total) by mouth daily.  Primary hypertension -     valsartan-hydrochlorothiazide (DIOVAN-HCT) 160-25 MG tablet; Take 1 tablet by mouth daily.    Patient has been counseled on age-appropriate routine health concerns for screening and prevention. These are reviewed and up-to-date. Referrals have been placed accordingly. Immunizations are up-to-date or declined.    Subjective:   Chief Complaint  Patient presents with   Medical Management of Chronic Issues    Riley Wiggins 57 y.o. male presents to office today for follow up to DM and HTN  Past medical history of HTN, T2DM, superior mesenteric vein thrombosis, and dyslipidemia   Followed by Cardiology and overdue for appointment. He has been instructed to follow up with Dr.  Debby Bud office as soon as possible.   DM 2 A1c has improved slightly.  He does endorse medication nonadherence due to not picking his medications up in a timely manner from the pharmacy.  Feels appetite is decreased due to taking Ozempic  Lab Results  Component Value Date   HGBA1C 7.7 (A) 12/11/2022    Lab Results  Component Value Date   HGBA1C 8.4 (A) 08/07/2022     HTN Blood pressure is nearing goal of <130/80.  He reports lower systolic readings at home in the 120s.  He is taking Diovan HCT 160-25 mg daily as prescribed BP Readings from Last 3 Encounters:  12/11/22 138/84  08/07/22 (!) 144/84  05/01/22 133/84       Review of Systems  Constitutional:  Negative for fever, malaise/fatigue and weight loss.  HENT: Negative.  Negative for nosebleeds.   Eyes: Negative.  Negative for blurred vision, double vision and photophobia.  Respiratory: Negative.  Negative for cough and shortness of breath.   Cardiovascular: Negative.  Negative for chest pain, palpitations and leg swelling.  Gastrointestinal: Negative.  Negative for heartburn, nausea and vomiting.  Musculoskeletal: Negative.  Negative for myalgias.  Neurological: Negative.  Negative for dizziness, focal weakness, seizures and headaches.  Psychiatric/Behavioral: Negative.  Negative for suicidal ideas.     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     Past Surgical History:  Procedure Laterality Date   HERNIA REPAIR     " when I was young "    Family History  Problem Relation Age of Onset   Diabetes Mother    Diabetes Father    Pancreatitis Neg Hx    Colon cancer Neg Hx    Stomach cancer Neg Hx     Social History Reviewed with no changes to be made today.   Outpatient  Medications Prior to Visit  Medication Sig Dispense Refill   acetaminophen (TYLENOL) 500 MG tablet Take 1,000 mg by mouth every 6 (six) hours as needed for mild pain.     Ascorbic Acid (VITAMIN C) 1000 MG tablet Take 1,000 mg by mouth daily.     atorvastatin (LIPITOR) 80 MG tablet Take 1 tablet (80 mg total) by mouth daily. 15 tablet 0   Blood Glucose Monitoring Suppl (ONETOUCH VERIO) w/Device KIT Use to check blood sugar three times daily. 1 kit 0   Blood Pressure Monitor DEVI Please provide patient with insurance approved blood pressure monitor 1 each 0   Garlic 1200 MG CAPS Take 1,200 mg by mouth daily.     glucose blood (ONETOUCH VERIO) test strip Use to check blood sugar three times daily. 100 each 6   Insulin Pen Needle (TECHLITE PEN NEEDLES) 31G X 5 MM MISC Use as instructed. Inject into the skin once daily. 100 each 3   Lancets (ONETOUCH DELICA PLUS LANCET33G) MISC Use to check blood sugar three times daily. 100 each 6   Multiple Vitamins-Minerals (MULTI FOR HIM 50+) TABS Take 1 tablet by mouth daily.     sildenafil (VIAGRA) 100 MG tablet Take 0.5-1 tablets (50-100 mg total) by mouth daily as needed for erectile dysfunction. 5 tablet 11   allopurinol (ZYLOPRIM) 100 MG tablet Take 1 tablet (100 mg total) by mouth daily. 90 tablet 0   apixaban (ELIQUIS) 5 MG TABS tablet Take 1 tablet (5 mg total) by mouth 2 (two) times daily. 60 tablet 6   dapagliflozin propanediol (FARXIGA) 10 MG TABS tablet Take 1 tablet (10 mg total) by mouth daily before breakfast. 30 tablet 0   ezetimibe (ZETIA) 10 MG tablet Take 1 tablet (10 mg total) by mouth daily. 90 tablet 3   Insulin Glargine Solostar (LANTUS) 100 UNIT/ML Solostar Pen Inject 28 Units into the skin daily. 15 mL 1   Semaglutide, 1 MG/DOSE, (OZEMPIC, 1 MG/DOSE,) 4 MG/3ML SOPN Inject 1 mg as directed once a week. 3 mL 0   valsartan-hydrochlorothiazide (DIOVAN-HCT) 160-25 MG tablet Take 1 tablet by mouth daily. 90 tablet 3   Continuous Glucose  Receiver (FREESTYLE LIBRE 3 READER) DEVI Use to check blood sugar continuously throughout the day. 1 each 0   Continuous Glucose Sensor (FREESTYLE LIBRE 3 SENSOR) MISC Place 1 sensor on the skin every 14 days. Use to check glucose continuously. (Patient not taking: Reported on 12/11/2022) 2 each 3   No facility-administered medications prior to visit.    No Known Allergies     Objective:    BP 138/84 (BP Location: Left Arm, Patient Position: Sitting, Cuff Size: Large)   Pulse 86   Ht 6\' 1"  (1.854 m)   Wt 272 lb 3.2 oz (123.5 kg)   SpO2 99%   BMI 35.91 kg/m  Wt Readings from Last 3 Encounters:  12/11/22 272 lb  3.2 oz (123.5 kg)  08/07/22 268 lb 12.8 oz (121.9 kg)  05/01/22 274 lb (124.3 kg)    Physical Exam Vitals and nursing note reviewed.  Constitutional:      Appearance: He is well-developed.  HENT:     Head: Normocephalic and atraumatic.  Cardiovascular:     Rate and Rhythm: Normal rate and regular rhythm.     Heart sounds: Normal heart sounds. No murmur heard.    No friction rub. No gallop.  Pulmonary:     Effort: Pulmonary effort is normal. No tachypnea or respiratory distress.     Breath sounds: Normal breath sounds. No decreased breath sounds, wheezing, rhonchi or rales.  Chest:     Chest wall: No tenderness.  Abdominal:     General: Bowel sounds are normal.     Palpations: Abdomen is soft.  Musculoskeletal:        General: Normal range of motion.     Cervical back: Normal range of motion.  Skin:    General: Skin is warm and dry.  Neurological:     Mental Status: He is alert and oriented to person, place, and time.     Coordination: Coordination normal.  Psychiatric:        Behavior: Behavior normal. Behavior is cooperative.        Thought Content: Thought content normal.        Judgment: Judgment normal.          Patient has been counseled extensively about nutrition and exercise as well as the importance of adherence with medications and regular  follow-up. The patient was given clear instructions to go to ER or return to medical center if symptoms don't improve, worsen or new problems develop. The patient verbalized understanding.   Follow-up: Return in about 3 months (around 03/13/2023).   Claiborne Rigg, FNP-BC [facility] [place], [place] [phone]   12/11/2022, 3:58 PM

## 2022-12-12 LAB — CBC WITH DIFFERENTIAL/PLATELET
Basophils Absolute: 0.1 10*3/uL (ref 0.0–0.2)
Basos: 1 %
EOS (ABSOLUTE): 0.2 10*3/uL (ref 0.0–0.4)
Eos: 1 %
Hematocrit: 49.4 % (ref 37.5–51.0)
Hemoglobin: 16.8 g/dL (ref 13.0–17.7)
Immature Grans (Abs): 0 10*3/uL (ref 0.0–0.1)
Immature Granulocytes: 0 %
Lymphocytes Absolute: 4 10*3/uL — ABNORMAL HIGH (ref 0.7–3.1)
Lymphs: 36 %
MCH: 33.6 pg — ABNORMAL HIGH (ref 26.6–33.0)
MCHC: 34 g/dL (ref 31.5–35.7)
MCV: 99 fL — ABNORMAL HIGH (ref 79–97)
Monocytes Absolute: 1 10*3/uL — ABNORMAL HIGH (ref 0.1–0.9)
Monocytes: 9 %
Neutrophils Absolute: 6 10*3/uL (ref 1.4–7.0)
Neutrophils: 53 %
Platelets: 216 10*3/uL (ref 150–450)
RBC: 5 x10E6/uL (ref 4.14–5.80)
RDW: 12.5 % (ref 11.6–15.4)
WBC: 11.3 10*3/uL — ABNORMAL HIGH (ref 3.4–10.8)

## 2022-12-12 LAB — CMP14+EGFR
ALT: 32 [IU]/L (ref 0–44)
AST: 26 [IU]/L (ref 0–40)
Albumin: 4.8 g/dL (ref 3.8–4.9)
Alkaline Phosphatase: 72 [IU]/L (ref 44–121)
BUN/Creatinine Ratio: 15 (ref 9–20)
BUN: 15 mg/dL (ref 6–24)
Bilirubin Total: 0.6 mg/dL (ref 0.0–1.2)
CO2: 24 mmol/L (ref 20–29)
Calcium: 10.3 mg/dL — ABNORMAL HIGH (ref 8.7–10.2)
Chloride: 100 mmol/L (ref 96–106)
Creatinine, Ser: 0.99 mg/dL (ref 0.76–1.27)
Globulin, Total: 2.9 g/dL (ref 1.5–4.5)
Glucose: 139 mg/dL — ABNORMAL HIGH (ref 70–99)
Potassium: 4.1 mmol/L (ref 3.5–5.2)
Sodium: 139 mmol/L (ref 134–144)
Total Protein: 7.7 g/dL (ref 6.0–8.5)
eGFR: 89 mL/min/{1.73_m2} (ref >59)

## 2022-12-12 LAB — MICROALBUMIN / CREATININE URINE RATIO
Creatinine, Urine: 75.3 mg/dL
Microalb/Creat Ratio: 90 mg/g{creat} — ABNORMAL HIGH (ref 0–29)
Microalbumin, Urine: 67.5 ug/mL

## 2022-12-19 ENCOUNTER — Other Ambulatory Visit: Payer: Self-pay | Admitting: Nurse Practitioner

## 2022-12-19 DIAGNOSIS — R809 Proteinuria, unspecified: Secondary | ICD-10-CM

## 2022-12-21 ENCOUNTER — Other Ambulatory Visit: Payer: Self-pay

## 2023-01-04 ENCOUNTER — Other Ambulatory Visit: Payer: Self-pay

## 2023-01-04 ENCOUNTER — Other Ambulatory Visit: Payer: Self-pay | Admitting: Internal Medicine

## 2023-01-08 ENCOUNTER — Other Ambulatory Visit: Payer: Self-pay

## 2023-01-08 MED ORDER — ATORVASTATIN CALCIUM 80 MG PO TABS
80.0000 mg | ORAL_TABLET | Freq: Every day | ORAL | 0 refills | Status: DC
  Filled 2023-01-08: qty 15, 15d supply, fill #0

## 2023-01-10 ENCOUNTER — Other Ambulatory Visit: Payer: Self-pay

## 2023-01-31 ENCOUNTER — Telehealth: Payer: Self-pay | Admitting: Internal Medicine

## 2023-01-31 ENCOUNTER — Other Ambulatory Visit: Payer: Self-pay

## 2023-01-31 ENCOUNTER — Other Ambulatory Visit: Payer: Self-pay | Admitting: Internal Medicine

## 2023-01-31 MED ORDER — ATORVASTATIN CALCIUM 80 MG PO TABS
80.0000 mg | ORAL_TABLET | Freq: Every day | ORAL | 0 refills | Status: DC
  Filled 2023-01-31: qty 30, 30d supply, fill #0
  Filled 2023-03-02: qty 30, 30d supply, fill #1

## 2023-01-31 NOTE — Telephone Encounter (Signed)
*  STAT* If patient is at the pharmacy, call can be transferred to refill team.   1. Which medications need to be refilled? (please list name of each medication and dose if known)   atorvastatin (LIPITOR) 80 MG tablet   2. Would you like to learn more about the convenience, safety, & potential cost savings by using the [facility]?   3. Are you open to using the [facility] (Type [facility]. ).  4. Which pharmacy/location (including street and city if local pharmacy) is medication to be sent to?  [facility]   5. Do they need a 30 day or 90 day supply? 90 day  Patient stated he is completely out of this medication.  Patient has appointment scheduled on 2/10.

## 2023-02-05 ENCOUNTER — Other Ambulatory Visit: Payer: Self-pay

## 2023-02-19 ENCOUNTER — Other Ambulatory Visit: Payer: Self-pay

## 2023-02-22 ENCOUNTER — Other Ambulatory Visit: Payer: Self-pay

## 2023-02-25 ENCOUNTER — Other Ambulatory Visit: Payer: Self-pay

## 2023-03-04 ENCOUNTER — Other Ambulatory Visit: Payer: Self-pay

## 2023-03-05 NOTE — Progress Notes (Signed)
 Cardiology Office Note:  .   Date:  03/18/2023  ID:  Elner Hahn, DOB Jun 17, 1965, MRN [id number] PCP: Collins Dean, NP  [facility] Providers Cardiologist:  Jann Melody, MD    History of Present Illness: .   Riley Wiggins is a 58 y.o. 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 Former smoker. 2022: Mild aortic dilation, No AF, no LV thrombus.  Patient here for yearly f/u. Denies chest pain, dyspnea, palpitations, edema. 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 Used to walk 1/2 mile daily. Has lost~ 25 lbs on ozempic .    ROS:    Studies Reviewed: Aaron Aas         Prior CV Studies:    ECHO COMPLETE WITH IMAGING ENHANCING AGENT 02/02/2021 1. Left ventricular ejection fraction, by estimation, is 55 to 60%. The left ventricle has normal function. The left ventricle has no regional wall motion abnormalities. There is mild asymmetric left ventricular hypertrophy of the basal-septal segment. Left ventricular diastolic parameters were normal. 2. Right ventricular systolic function is normal. The right ventricular size is normal. Tricuspid regurgitation signal is inadequate for assessing PA pressure. 3. The mitral valve is grossly normal. Trivial mitral valve regurgitation. No evidence of mitral stenosis. 4. The aortic valve is tricuspid. There is mild calcification of the aortic valve. Aortic valve regurgitation is not visualized. Aortic valve sclerosis is present, with no evidence of aortic valve stenosis. 5. Aortic dilatation noted. There is mild dilatation of the aortic root, measuring 40 mm. There is mild dilatation of the ascending aorta, measuring 41 mm.     LONG TERM MONITOR (3-7 DAYS) INTERPRETATION 01/27/2021   Narrative  Patient had a minimum heart rate of 41 bpm, maximum heart rate of 152 bpm, and average heart rate of 95 bpm.  Predominant underlying rhythm was sinus  rhythm.  One run of SVT 4 beats at longest with a max rate of 132 bpm at fastest.  Isolated PACs were rare (<1.0%).  Isolated PVCs were rare (<1.0%).  Nocturnal Mobitz Type I heart block noted.  Triggered and diary events associated with sinus rhythm or rare PVCs.   No malignant arrhythmias.       Cardiac CT: Date:04/19/2007 Results: Findings:  Ascending aorta above sinotubular junction is mildly dilated at 3.5 cm in  diameter.  The descending aorta at the same level is 2.2 cm in diameter.   The left atrium has normal morphology. The aortic valve is tricuspid.   Normal right dominant coronary anatomy.  No coronary calcification.   Left Main: No stenosis.  Left Anterior Descending: No stenosis.  Left Circumflex: No stenosis.  Right Coronary Artery: No stenosis.   Impression:  1) No significant coronary artery disease.  2) No coronary calcification.  3)         See additional report for lung fields.          Risk Assessment/Calculations:             Physical Exam:   VS:  BP 118/74 (BP Location: Left Arm, Patient Position: Sitting, Cuff Size: Large)   Pulse 98   Resp 16   Ht 6\' 1"  (1.854 m)   Wt 272 lb (123.4 kg)   SpO2 95%   BMI 35.89 kg/m    Wt Readings from Last 3 Encounters:  03/18/23 272 lb (123.4 kg)  12/11/22 272 lb 3.2 oz (123.5  kg)  08/07/22 268 lb 12.8 oz (121.9 kg)    GEN: obese, in no acute distress NECK: No JVD; No carotid bruits CARDIAC:  RRR, 2/6 systolic murmur LSB RESPIRATORY:  Clear to auscultation without rales, wheezing or rhonchi  ABDOMEN: Soft, non-tender, non-distended EXTREMITIES:  No edema; No deformity   ASSESSMENT AND PLAN: .    Aortic Atherosclerosis on CT-on lipitor, no ASA with eliquis  -150 min exercise weekly  Hypertension with Diabetes- well controlled  HLD with DM-check FLP today  Morbid Obesity - has lost 25 lbs on ozempic    Aortic root dilatation 43 cm on Echo in 2023 -needs repeat   Superior Mesenteric  Thrombus - on Eliquis   per primary          Dispo: f/u in 1 yr.  Signed, Theotis Flake, PA-C

## 2023-03-06 ENCOUNTER — Other Ambulatory Visit: Payer: Self-pay

## 2023-03-08 ENCOUNTER — Other Ambulatory Visit: Payer: Self-pay

## 2023-03-13 ENCOUNTER — Ambulatory Visit: Payer: Commercial Managed Care - HMO | Admitting: Nurse Practitioner

## 2023-03-13 ENCOUNTER — Other Ambulatory Visit: Payer: Self-pay

## 2023-03-14 ENCOUNTER — Other Ambulatory Visit: Payer: Self-pay

## 2023-03-15 ENCOUNTER — Other Ambulatory Visit: Payer: Self-pay

## 2023-03-18 ENCOUNTER — Other Ambulatory Visit: Payer: Self-pay

## 2023-03-18 ENCOUNTER — Ambulatory Visit: Payer: Commercial Managed Care - HMO | Attending: Physician Assistant | Admitting: Physician Assistant

## 2023-03-18 ENCOUNTER — Encounter: Payer: Self-pay | Admitting: Physician Assistant

## 2023-03-18 VITALS — BP 118/74 | HR 98 | Resp 16 | Ht 73.0 in | Wt 272.0 lb

## 2023-03-18 DIAGNOSIS — I77810 Thoracic aortic ectasia: Secondary | ICD-10-CM | POA: Diagnosis not present

## 2023-03-18 DIAGNOSIS — I10 Essential (primary) hypertension: Secondary | ICD-10-CM

## 2023-03-18 DIAGNOSIS — E1169 Type 2 diabetes mellitus with other specified complication: Secondary | ICD-10-CM | POA: Diagnosis not present

## 2023-03-18 DIAGNOSIS — E66812 Obesity, class 2: Secondary | ICD-10-CM | POA: Diagnosis not present

## 2023-03-18 DIAGNOSIS — E785 Hyperlipidemia, unspecified: Secondary | ICD-10-CM

## 2023-03-18 DIAGNOSIS — I700 Atherosclerosis of aorta: Secondary | ICD-10-CM

## 2023-03-18 MED ORDER — ATORVASTATIN CALCIUM 80 MG PO TABS
80.0000 mg | ORAL_TABLET | Freq: Every day | ORAL | 3 refills | Status: DC
  Filled 2023-03-18 – 2023-03-22 (×2): qty 90, 90d supply, fill #0
  Filled 2023-04-11: qty 30, 30d supply, fill #0
  Filled 2023-05-02 – 2023-05-12 (×2): qty 30, 30d supply, fill #1
  Filled 2023-05-23 – 2023-06-27 (×4): qty 30, 30d supply, fill #2
  Filled 2023-07-15 – 2023-07-22 (×2): qty 30, 30d supply, fill #3
  Filled 2023-08-10: qty 30, 30d supply, fill #4

## 2023-03-18 NOTE — Patient Instructions (Signed)
 Medication Instructions:  Your physician recommends that you continue on your current medications as directed. Please refer to the Current Medication list given to you today.  *If you need a refill on your cardiac medications before your next appointment, please call your pharmacy*   Lab Work: TODAY:  BMET, CBC, & LIPID  If you have labs (blood work) drawn today and your tests are completely normal, you will receive your results only by: MyChart Message (if you have MyChart) OR A paper copy in the mail If you have any lab test that is abnormal or we need to change your treatment, we will call you to review the results.   Testing/Procedures: Your physician has requested that you have an echocardiogram. Echocardiography is a painless test that uses sound waves to create images of your heart. It provides your doctor with information about the size and shape of your heart and how well your heart's chambers and valves are working. This procedure takes approximately one hour. There are no restrictions for this procedure. Please do NOT wear cologne, perfume, aftershave, or lotions (deodorant is allowed). Please arrive 15 minutes prior to your appointment time.  Please note: We ask at that you not bring children with you during ultrasound (echo/ vascular) testing. Due to room size and safety concerns, children are not allowed in the ultrasound rooms during exams. Our front office staff cannot provide observation of children in our lobby area while testing is being conducted. An adult accompanying a patient to their appointment will only be allowed in the ultrasound room at the discretion of the ultrasound technician under special circumstances. We apologize for any inconvenience.     Follow-Up: At [facility], you and your health needs are our priority.  As part of our continuing mission to provide you with exceptional heart care, we have created designated Provider Care Teams.  These  Care Teams include your primary Cardiologist (physician) and Advanced Practice Providers (APPs -  Physician Assistants and Nurse Practitioners) who all work together to provide you with the care you need, when you need it.  We recommend signing up for the patient portal called "MyChart".  Sign up information is provided on this After Visit Summary.  MyChart is used to connect with patients for Virtual Visits (Telemedicine).  Patients are able to view lab/test results, encounter notes, upcoming appointments, etc.  Non-urgent messages can be sent to your provider as well.   To learn more about what you can do with MyChart, go to [email or web address].    Your next appointment:   12 month(s)  Provider:   Jann Melody, MD     Other Instructions       1st Floor: - Lobby - Registration  - Pharmacy  - Lab - Cafe  2nd Floor: - PV Lab - Diagnostic Testing (echo, CT, nuclear med)  3rd Floor: - Vacant  4th Floor: - TCTS (cardiothoracic surgery) - AFib Clinic - Structural Heart Clinic - Vascular Surgery  - Vascular Ultrasound  5th Floor: - HeartCare Cardiology (general and EP) - Clinical Pharmacy for coumadin , hypertension, lipid, weight-loss medications, and med management appointments    Valet parking services will be available as well.

## 2023-03-19 ENCOUNTER — Telehealth: Payer: Self-pay

## 2023-03-19 DIAGNOSIS — E1169 Type 2 diabetes mellitus with other specified complication: Secondary | ICD-10-CM

## 2023-03-19 LAB — BASIC METABOLIC PANEL
BUN/Creatinine Ratio: 20 (ref 9–20)
BUN: 20 mg/dL (ref 6–24)
CO2: 22 mmol/L (ref 20–29)
Calcium: 10.3 mg/dL — ABNORMAL HIGH (ref 8.7–10.2)
Chloride: 98 mmol/L (ref 96–106)
Creatinine, Ser: 1.02 mg/dL (ref 0.76–1.27)
Glucose: 148 mg/dL — ABNORMAL HIGH (ref 70–99)
Potassium: 4.3 mmol/L (ref 3.5–5.2)
Sodium: 140 mmol/L (ref 134–144)
eGFR: 86 mL/min/{1.73_m2} (ref >59)

## 2023-03-19 LAB — CBC
Hematocrit: 50.5 % (ref 37.5–51.0)
Hemoglobin: 17.4 g/dL (ref 13.0–17.7)
MCH: 33.9 pg — ABNORMAL HIGH (ref 26.6–33.0)
MCHC: 34.5 g/dL (ref 31.5–35.7)
MCV: 98 fL — ABNORMAL HIGH (ref 79–97)
Platelets: 189 10*3/uL (ref 150–450)
RBC: 5.13 x10E6/uL (ref 4.14–5.80)
RDW: 12.3 % (ref 11.6–15.4)
WBC: 10.7 10*3/uL (ref 3.4–10.8)

## 2023-03-19 LAB — LIPID PANEL
Chol/HDL Ratio: 2.7 {ratio} (ref 0.0–5.0)
Cholesterol, Total: 183 mg/dL (ref 100–199)
HDL: 68 mg/dL (ref >39)
LDL Chol Calc (NIH): 80 mg/dL (ref 0–99)
Triglycerides: 216 mg/dL — ABNORMAL HIGH (ref 0–149)
VLDL Cholesterol Cal: 35 mg/dL (ref 5–40)

## 2023-03-19 NOTE — Telephone Encounter (Signed)
Spoke with patient concerning recent lab work - referral placed.

## 2023-03-19 NOTE — Telephone Encounter (Signed)
-----   Message from Jacolyn Reedy sent at 03/19/2023  7:43 AM EST ----- LDL above goal of 55 and triglycerides up. Please refer to lipid clinic. Reduce simple sugars and saturated fats in diet thanks

## 2023-03-22 ENCOUNTER — Other Ambulatory Visit: Payer: Self-pay

## 2023-03-23 ENCOUNTER — Other Ambulatory Visit: Payer: Self-pay | Admitting: Internal Medicine

## 2023-03-23 DIAGNOSIS — I700 Atherosclerosis of aorta: Secondary | ICD-10-CM

## 2023-03-25 ENCOUNTER — Other Ambulatory Visit: Payer: Self-pay

## 2023-03-27 ENCOUNTER — Other Ambulatory Visit: Payer: Self-pay

## 2023-03-29 ENCOUNTER — Other Ambulatory Visit: Payer: Self-pay

## 2023-04-02 ENCOUNTER — Encounter: Payer: Self-pay | Admitting: Nurse Practitioner

## 2023-04-11 ENCOUNTER — Ambulatory Visit (HOSPITAL_COMMUNITY): Payer: Commercial Managed Care - HMO | Attending: Physician Assistant

## 2023-04-11 ENCOUNTER — Other Ambulatory Visit: Payer: Self-pay

## 2023-04-11 DIAGNOSIS — E785 Hyperlipidemia, unspecified: Secondary | ICD-10-CM | POA: Diagnosis present

## 2023-04-11 DIAGNOSIS — I77810 Thoracic aortic ectasia: Secondary | ICD-10-CM | POA: Diagnosis present

## 2023-04-11 DIAGNOSIS — I10 Essential (primary) hypertension: Secondary | ICD-10-CM

## 2023-04-11 DIAGNOSIS — E66812 Obesity, class 2: Secondary | ICD-10-CM | POA: Diagnosis present

## 2023-04-11 DIAGNOSIS — I700 Atherosclerosis of aorta: Secondary | ICD-10-CM

## 2023-04-11 DIAGNOSIS — E1169 Type 2 diabetes mellitus with other specified complication: Secondary | ICD-10-CM | POA: Diagnosis present

## 2023-04-11 LAB — ECHOCARDIOGRAM COMPLETE
Area-P 1/2: 4.71 cm2
S' Lateral: 2.4 cm

## 2023-04-16 ENCOUNTER — Ambulatory Visit: Payer: Commercial Managed Care - HMO | Attending: Nurse Practitioner | Admitting: Nurse Practitioner

## 2023-04-16 ENCOUNTER — Encounter: Payer: Self-pay | Admitting: Nurse Practitioner

## 2023-04-16 ENCOUNTER — Other Ambulatory Visit: Payer: Self-pay

## 2023-04-16 VITALS — BP 130/85 | HR 96 | Resp 19 | Ht 73.0 in | Wt 269.6 lb

## 2023-04-16 DIAGNOSIS — M1A9XX0 Chronic gout, unspecified, without tophus (tophi): Secondary | ICD-10-CM | POA: Diagnosis not present

## 2023-04-16 DIAGNOSIS — E1165 Type 2 diabetes mellitus with hyperglycemia: Secondary | ICD-10-CM | POA: Diagnosis not present

## 2023-04-16 DIAGNOSIS — Z794 Long term (current) use of insulin: Secondary | ICD-10-CM | POA: Diagnosis not present

## 2023-04-16 LAB — POCT GLYCOSYLATED HEMOGLOBIN (HGB A1C): Hemoglobin A1C: 8.8 % — AB (ref 4.0–5.6)

## 2023-04-16 MED ORDER — ALLOPURINOL 100 MG PO TABS
100.0000 mg | ORAL_TABLET | Freq: Every day | ORAL | 1 refills | Status: DC
  Filled 2023-05-02: qty 90, 90d supply, fill #0
  Filled 2023-05-12: qty 30, 30d supply, fill #0
  Filled 2023-06-09 – 2023-06-27 (×2): qty 30, 30d supply, fill #1
  Filled 2023-07-15 – 2023-07-22 (×2): qty 30, 30d supply, fill #2
  Filled 2023-08-10: qty 30, 30d supply, fill #3
  Filled 2023-11-20: qty 30, 30d supply, fill #4

## 2023-04-16 MED ORDER — DAPAGLIFLOZIN PROPANEDIOL 10 MG PO TABS
10.0000 mg | ORAL_TABLET | Freq: Every day | ORAL | 1 refills | Status: DC
Start: 2023-04-16 — End: 2023-10-20
  Filled 2023-04-16: qty 30, 30d supply, fill #0
  Filled 2023-04-16: qty 90, 90d supply, fill #0
  Filled 2023-05-02: qty 30, 30d supply, fill #0
  Filled 2023-05-12 – 2023-05-27 (×3): qty 30, 30d supply, fill #1
  Filled 2023-06-09 – 2023-06-27 (×4): qty 30, 30d supply, fill #2
  Filled 2023-07-22: qty 30, 30d supply, fill #3
  Filled 2023-08-10 – 2023-08-24 (×2): qty 30, 30d supply, fill #4
  Filled 2023-09-29: qty 30, 30d supply, fill #5

## 2023-04-16 NOTE — Progress Notes (Signed)
 disregard

## 2023-04-16 NOTE — Progress Notes (Signed)
 I have seen and examined this patient with the advanced practice provider STUDENT and agree with the note below

## 2023-04-16 NOTE — Progress Notes (Signed)
 .  Assessment & Plan:  Riley Wiggins was seen today for medical management of chronic issues.  Diagnoses and all orders for this visit:  Type 2 diabetes mellitus with hyperglycemia, with long-term current use of insulin (HCC) -     POCT glycosylated hemoglobin (Hb A1C) -     dapagliflozin propanediol (FARXIGA) 10 MG TABS tablet; Take 1 tablet (10 mg total) by mouth daily before breakfast. Continue Basaglar, and Ozempic  Hgb A1C 8.8, goal Hgb A1c 7.0 -educated patient Reduce sugar drinks and highly processed foods Start exercise program 3-5 days per week for 30 minutes Continue to exam feet daily, keep clean, moisturize feet with emollient cream, and wear shoes to avoid injury to feet    Chronic gout without tophus, unspecified cause, unspecified site -     allopurinol (ZYLOPRIM) 100 MG tablet; Take 1 tablet (100 mg total) by mouth daily. Continue to monitor diet for foods high in purine such as red meats, seafood Drink water to stay well hydrated     Patient has been counseled on age-appropriate routine health concerns for screening and prevention. These are reviewed and up-to-date. Referrals have been placed accordingly. Immunizations are up-to-date or declined.    Subjective:   Chief Complaint  Patient presents with   Medical Management of Chronic Issues    Riley Wiggins 58 y.o. male presents to office today for routine follow up on diabetes. Completed Hgb A1C which is elevated at 8.8. Educated goal is less than 7.0. States he is taking medications as prescribed. Patient reports he drinks zero calorie drinks and limits his carbohydrate intake. Drinks water with zero calorie flavored packets. Reports he was walking for exercise but stopped his routine due to cold weather. Plans to restart walking 5-7 times per week to help reduce his Hgb A1C.     Review of Systems  Constitutional:  Positive for weight loss.       Intentional weight loss  HENT: Negative.    Eyes: Negative.    Respiratory: Negative.    Cardiovascular: Negative.  Negative for chest pain and leg swelling.  Gastrointestinal: Negative.   Genitourinary: Negative.   Musculoskeletal: Negative.   Skin: Negative.   Neurological: Negative.   Endo/Heme/Allergies: Negative.   Psychiatric/Behavioral: Negative.     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     Past Surgical History:  Procedure Laterality Date   HERNIA REPAIR     " when I was young "    Family History  Problem Relation Age of Onset   Diabetes Mother    Diabetes Father    Pancreatitis Neg Hx    Colon cancer Neg Hx    Stomach cancer Neg Hx     Social History Reviewed with no changes to be made today.   Outpatient Medications Prior to Visit  Medication Sig Dispense Refill   acetaminophen (TYLENOL) 500 MG tablet Take 1,000 mg by mouth every 6 (six) hours as needed for mild pain (pain score 1-3).     apixaban (ELIQUIS) 5 MG TABS tablet Take 1 tablet (5 mg total) by mouth 2 (two) times daily. 180 tablet 2   Ascorbic Acid (VITAMIN C) 1000 MG tablet Take 1,000 mg by mouth daily.     atorvastatin (LIPITOR) 80 MG  tablet Take 1 tablet (80 mg total) by mouth daily. 90 tablet 3   Blood Glucose Monitoring Suppl (ONETOUCH VERIO) w/Device KIT Use to check blood sugar three times daily. 1 kit 0   Blood Pressure Monitor DEVI Please provide patient with insurance approved blood pressure monitor 1 each 0   ezetimibe (ZETIA) 10 MG tablet Take 1 tablet by mouth once daily 90 tablet 3   Garlic 1200 MG CAPS Take 1,200 mg by mouth daily.     glucose blood (ONETOUCH VERIO) test strip Use to check blood sugar three times daily. 100 each 6   Insulin Glargine (BASAGLAR KWIKPEN) 100 UNIT/ML Inject 28 Units into the skin  daily. 15 mL PRN   Insulin Pen Needle (TECHLITE PEN NEEDLES) 31G X 5 MM MISC Use as instructed. Inject into the skin once daily. 100 each 3   Lancets (ONETOUCH DELICA PLUS LANCET33G) MISC Use to check blood sugar three times daily. 100 each 6   Multiple Vitamins-Minerals (MULTI FOR HIM 50+) TABS Take 1 tablet by mouth daily.     Semaglutide, 1 MG/DOSE, (OZEMPIC, 1 MG/DOSE,) 4 MG/3ML SOPN Inject 1 mg as directed once a week. 9 mL 1   sildenafil (VIAGRA) 100 MG tablet Take 0.5-1 tablets (50-100 mg total) by mouth daily as needed for erectile dysfunction. 5 tablet 11   valsartan-hydrochlorothiazide (DIOVAN-HCT) 160-25 MG tablet Take 1 tablet by mouth daily. 90 tablet 3   allopurinol (ZYLOPRIM) 100 MG tablet Take 1 tablet (100 mg total) by mouth daily. 90 tablet 1   dapagliflozin propanediol (FARXIGA) 10 MG TABS tablet Take 1 tablet (10 mg total) by mouth daily before breakfast. 90 tablet 1   Continuous Glucose Sensor (FREESTYLE LIBRE 3 SENSOR) MISC Place 1 sensor on the skin every 14 days. Use to check glucose continuously. (Patient not taking: Reported on 04/16/2023) 2 each 3   No facility-administered medications prior to visit.    No Known Allergies     Objective:    BP 130/85 (BP Location: Left Arm, Patient Position: Sitting, Cuff Size: Large)   Pulse 96   Resp 19   Ht 6\' 1"  (1.854 m)   Wt 269 lb 9.6 oz (122.3 kg)   SpO2 100%   BMI 35.57 kg/m  Wt Readings from Last 3 Encounters:  04/16/23 269 lb 9.6 oz (122.3 kg)  03/18/23 272 lb (123.4 kg)  12/11/22 272 lb 3.2 oz (123.5 kg)    Physical Exam Vitals and nursing note reviewed.  Constitutional:      Appearance: Normal appearance.  HENT:     Head: Normocephalic.  Cardiovascular:     Rate and Rhythm: Normal rate and regular rhythm.  Pulmonary:     Effort: Pulmonary effort is normal.     Breath sounds: Normal breath sounds.  Musculoskeletal:        General: Normal range of motion.     Cervical back: Normal range of motion.   Skin:    General: Skin is warm and dry.  Neurological:     General: No focal deficit present.     Mental Status: He is alert and oriented to person, place, and time.  Psychiatric:        Attention and Perception: Attention normal.        Mood and Affect: Mood normal.        Behavior: Behavior normal.        Thought Content: Thought content normal.        Cognition and Memory: Cognition normal.  Judgment: Judgment normal.        Patient has been counseled extensively about nutrition and exercise as well as the importance of adherence with medications and regular follow-up. The patient was given clear instructions to go to ER or return to medical center if symptoms don't improve, worsen or new problems develop. The patient verbalized understanding.   Follow-up: Return in about 3 months (around 07/17/2023).   Joette Catching, BSN, RN student FNP [facility] [place], [place] [phone]   04/16/2023, 3:29 PM

## 2023-05-02 ENCOUNTER — Telehealth: Payer: Self-pay

## 2023-05-02 ENCOUNTER — Other Ambulatory Visit: Payer: Self-pay | Admitting: Pharmacist

## 2023-05-02 ENCOUNTER — Other Ambulatory Visit: Payer: Self-pay

## 2023-05-02 MED ORDER — ACCU-CHEK GUIDE TEST VI STRP
1.0000 | ORAL_STRIP | Freq: Three times a day (TID) | 6 refills | Status: AC
  Filled 2023-05-02: qty 100, 33d supply, fill #0
  Filled 2023-05-02: qty 100, 30d supply, fill #0
  Filled 2023-07-15: qty 100, 30d supply, fill #1
  Filled 2023-08-24: qty 100, 30d supply, fill #2
  Filled 2023-10-20: qty 100, 30d supply, fill #3
  Filled 2024-02-05: qty 100, 30d supply, fill #4

## 2023-05-02 MED ORDER — ACCU-CHEK GUIDE W/DEVICE KIT
PACK | 0 refills | Status: DC
  Filled 2023-05-02: qty 1, fill #0
  Filled 2023-05-02: qty 1, 30d supply, fill #0

## 2023-05-02 MED ORDER — ACCU-CHEK SOFTCLIX LANCETS MISC
1.0000 | Freq: Three times a day (TID) | 6 refills | Status: AC
  Filled 2023-05-02: qty 100, fill #0
  Filled 2023-05-02 (×2): qty 100, 30d supply, fill #0
  Filled 2023-07-22: qty 100, 30d supply, fill #1

## 2023-05-02 NOTE — Telephone Encounter (Signed)
 Pharmacy Patient Advocate Encounter  Received notification from [employer] that Prior Authorization for ACCU-CHEK GUIDE TEST STRIPS AND SOFTCLIX LANCETS has been APPROVED from 05/02/2023 to 05/01/2024   PA #/Case ID/Reference #: [id number] & [id number]

## 2023-05-08 ENCOUNTER — Ambulatory Visit: Payer: Commercial Managed Care - HMO

## 2023-05-09 ENCOUNTER — Other Ambulatory Visit: Payer: Self-pay

## 2023-05-13 ENCOUNTER — Other Ambulatory Visit: Payer: Self-pay

## 2023-05-21 ENCOUNTER — Telehealth: Payer: Self-pay | Admitting: Pharmacist

## 2023-05-21 NOTE — Progress Notes (Unsigned)
 Patient ID: Riley Wiggins                 DOB: 05-Dec-1965                    MRN: [id number]      HPI: Riley Wiggins is a 58 y.o. male patient referred to lipid clinic by Riley Reedy, PA. 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 Former smoker, obesity.  Pt presented today for lipid clinic. Reports he has been taking Lipitor and Zetia regularly from past 1 month. Before feb lab he was off of both cholesterol meds as there was delay in refill approval so he wants to get new baseline while him being on both medications regularly little over a month. We discussed past few lipid lab and persistently elevated TG level. He is currently on insulin and 1 mg Ozempic for BG management. He had pancreatitis not too long ago so he is staying on 1 mg dose. He hs lost around 25-30 lbs weight on Ozempic. His current FBG still staying in 131-150 range. We discussed PCSK9i agent and Vascepa. Discussed mechanisms of action, dosing, side effects and potential decreases in LDL and TG.  Also reviewed cost information and potential options for patient assistance.   Current Medications: Atorvastatin 80 mg daily and Zetia 10 mg daily  Intolerances: none  Risk Factors: T2DM, HLD, hypertension, former smoker, obesity, premature aortic atherosclerosis LDL goal: <55 mg/dl TG  Last lab on 40/98/1191 TC 183, TG 216, HDL 68, LDL 80 was off of cholesterol meds for a month just before lab    Diet: small apetite  Lunch -snacks like pack of nibs, sugar free drinks  Dinner: baked chicken and broccoli, green beans and black beans    Exercise: none, once weather gets better will restart 1 mile walk every day that he was doing before     Social History:  Alcohol: 3 times per month 2 drinks  Smoke: quit smoking 15 years ago  Labs:  Lipid Panel     Component Value Date/Time   CHOL 183 03/18/2023 1235   TRIG 216 (H) 03/18/2023 1235   HDL 68 03/18/2023 1235   CHOLHDL  2.7 03/18/2023 1235   CHOLHDL 4.4 08/03/2019 2005   VLDL UNABLE TO CALCULATE IF TRIGLYCERIDE OVER 400 mg/dL 47/82/9562 1308   LDLCALC 80 03/18/2023 1235   LDLDIRECT 57.0 08/03/2019 2005   LABVLDL 35 03/18/2023 1235    Past Medical History:  Diagnosis Date   Diabetes mellitus without complication (HCC)    Dyslipidemia    Hyperlipidemia    Hypertension    Thrombosis 03/15/2020   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     Current Outpatient Medications on File Prior to Visit  Medication Sig Dispense Refill   Accu-Chek Softclix Lancets lancets Use to check blood sugar 3 times daily. 100 each 6   acetaminophen (TYLENOL) 500 MG tablet Take 1,000 mg by mouth every 6 (six) hours as needed for mild pain (pain score 1-3).     allopurinol (ZYLOPRIM) 100 MG tablet Take 1 tablet (100 mg total) by mouth daily. 90 tablet 1   apixaban (ELIQUIS) 5 MG TABS tablet Take 1 tablet (5 mg total) by mouth 2 (two) times daily. 180 tablet 2   Ascorbic Acid (VITAMIN C) 1000 MG tablet Take 1,000 mg by mouth daily.     atorvastatin (  LIPITOR) 80 MG tablet Take 1 tablet (80 mg total) by mouth daily. 90 tablet 3   Blood Glucose Monitoring Suppl (ACCU-CHEK GUIDE) w/Device KIT Use to check blood sugar 3 times daily. 1 kit 0   Blood Pressure Monitor DEVI Please provide patient with insurance approved blood pressure monitor 1 each 0   Continuous Glucose Sensor (FREESTYLE LIBRE 3 SENSOR) MISC Place 1 sensor on the skin every 14 days. Use to check glucose continuously. (Patient not taking: Reported on 04/16/2023) 2 each 3   dapagliflozin propanediol (FARXIGA) 10 MG TABS tablet Take 1 tablet (10 mg total) by mouth daily before breakfast. 90 tablet 1   ezetimibe (ZETIA) 10 MG tablet Take 1 tablet by mouth once daily 90 tablet 3   Garlic 1200 MG CAPS Take 1,200 mg by mouth daily.     glucose blood (ACCU-CHEK GUIDE TEST) test strip Use to check blood  sugar 3 times daily. 100 each 6   Insulin Glargine (BASAGLAR KWIKPEN) 100 UNIT/ML Inject 28 Units into the skin daily. 15 mL PRN   Insulin Pen Needle (TECHLITE PEN NEEDLES) 31G X 5 MM MISC Use as instructed. Inject into the skin once daily. 100 each 3   Multiple Vitamins-Minerals (MULTI FOR HIM 50+) TABS Take 1 tablet by mouth daily.     Semaglutide, 1 MG/DOSE, (OZEMPIC, 1 MG/DOSE,) 4 MG/3ML SOPN Inject 1 mg as directed once a week. 9 mL 1   sildenafil (VIAGRA) 100 MG tablet Take 0.5-1 tablets (50-100 mg total) by mouth daily as needed for erectile dysfunction. 5 tablet 11   valsartan-hydrochlorothiazide (DIOVAN-HCT) 160-25 MG tablet Take 1 tablet by mouth daily. 90 tablet 3   No current facility-administered medications on file prior to visit.    No Known Allergies  Assessment/Plan:  1. Hyperlipidemia -  Problem  Hyperlipidemia   Current Medications: Atorvastatin 80 mg daily and Zetia 10 mg daily  Intolerances: none  Risk Factors: T2DM, HLD, hypertension, former smoker, obesity, premature aortic atherosclerosis LDL goal: <55 mg/dl TG  Last lab on 30/86/5784 TC 183, TG 216, HDL 68, LDL 80 was off of cholesterol meds for a month just before lab      Hyperlipidemia Assessment:  LDL goal: < 55 mg/dl last LDLc 80 mg/dl while off of all cholesterol meds for a month before lab TG is persistently elevated (10/2022 acute pancreatitis)   Tolerates Zetia and high intensity statins well without any side effects  Discussed next potential options (PCSK-9 inhibitors, bempedoic acid and inclisiran); cost, dosing efficacy, side effects  Reiterated importance of heart healthy diet and regular exercise   Plan: continue taking Atorvastatin 80 mg daily and Zetia 10 mg daily We will get updated lipid lab today, if LDL above the goal we will add Repatha injection to current cholesterol medications.  Start taking Vascepa 2 gm twice daily  Follow up lab due in 3 months     Thank you,  Riley Wiggins, Pharm.D [facility] of [facility] [address], [place], [place] [address]  Phone: [phone]; Fax: [phone]

## 2023-05-22 ENCOUNTER — Telehealth: Payer: Self-pay | Admitting: Pharmacy Technician

## 2023-05-22 ENCOUNTER — Encounter: Payer: Self-pay | Admitting: Pharmacist

## 2023-05-22 ENCOUNTER — Other Ambulatory Visit: Payer: Self-pay

## 2023-05-22 ENCOUNTER — Telehealth: Payer: Self-pay | Admitting: Pharmacist

## 2023-05-22 ENCOUNTER — Ambulatory Visit: Attending: Cardiovascular Disease | Admitting: Pharmacist

## 2023-05-22 ENCOUNTER — Other Ambulatory Visit (HOSPITAL_COMMUNITY): Payer: Self-pay

## 2023-05-22 DIAGNOSIS — E785 Hyperlipidemia, unspecified: Secondary | ICD-10-CM | POA: Insufficient documentation

## 2023-05-22 DIAGNOSIS — E7849 Other hyperlipidemia: Secondary | ICD-10-CM | POA: Diagnosis not present

## 2023-05-22 DIAGNOSIS — E1169 Type 2 diabetes mellitus with other specified complication: Secondary | ICD-10-CM

## 2023-05-22 LAB — LIPID PANEL
Chol/HDL Ratio: 3.2 ratio (ref 0.0–5.0)
Cholesterol, Total: 178 mg/dL (ref 100–199)
HDL: 55 mg/dL (ref >39)
LDL Chol Calc (NIH): 82 mg/dL (ref 0–99)
Triglycerides: 248 mg/dL — ABNORMAL HIGH (ref 0–149)
VLDL Cholesterol Cal: 41 mg/dL — ABNORMAL HIGH (ref 5–40)

## 2023-05-22 MED ORDER — ICOSAPENT ETHYL 1 G PO CAPS
2.0000 g | ORAL_CAPSULE | Freq: Two times a day (BID) | ORAL | 11 refills | Status: DC
Start: 2023-05-22 — End: 2023-11-29
  Filled 2023-05-22: qty 120, 30d supply, fill #0
  Filled 2023-06-09 – 2023-08-24 (×5): qty 120, 30d supply, fill #1

## 2023-05-22 NOTE — Telephone Encounter (Signed)
 Pharmacy Patient Advocate Encounter   Received notification from Pt Calls Messages that prior authorization for icosapent is required/requested.   Insurance verification completed.   The patient is insured through [employer] .   Per test claim: PA required; PA submitted to above mentioned insurance via CoverMyMeds Key/confirmation #/EOC  [id number] Status is pending

## 2023-05-22 NOTE — Telephone Encounter (Signed)
 Pharmacy Patient Advocate Encounter  Received notification from [employer] that Prior Authorization for icosapent has been APPROVED from 05/22/23 to 05/21/24. Ran test claim, Copay is $4.00- one month. This test claim was processed through [facility]- copay amounts may vary at other pharmacies due to pharmacy/plan contracts, or as the patient moves through the different stages of their insurance plan.   PA #/Case ID/Reference #: [id number]

## 2023-05-22 NOTE — Patient Instructions (Signed)
 Your Results:             Your most recent labs Goal  Total Cholesterol 183 < 200  Triglycerides 216 < 150  HDL (happy/good cholesterol) 68 > 40  LDL (lousy/bad cholesterol 80 < 55   Medication changes: continue taking Atorvastatin 80 mg daily and Zetia 10 mg daily. We will get updated lipid lab today, if LDL above the goal we will add Repatha injection to current cholesterol medications. TG is persistently elevated. Vascepa helps to lower TG so will assess coverage for that.    Lab orders: We want to repeat labs after 2-3 months.  We will send you a lab order to remind you once we get closer to that time.           Meal Planning:   Meal planning is the key to setting you up for success. Here are some examples of healthy meal options.   Breakfast:    Option 1:  Omelette with vegetables (1 egg, spinach, mushrooms, or other vegetable of your choice), 2 slices whole-grain toast, tip of thumb size butter or soft margarine,  cup low-fat milk or yogurt    Option 2: steel-cut rolled oats (? cup dry), 1 tbsp peanut butter added to cooked oats,  cup low-fat milk.   Option 3: 2 slices whole-grain or rye toast with avocado spread ( small avocado mased with herbs and pepper to taste), 1 poached egg or sunnyside up (cooked to your liking)   Option 4:  cup plain 0% [place] yogurt topped with  cup berries and  cup walnuts or almonds, 2 slices whole-grain or rye toast, tip of thumb size soft margarine/butter   Lunch:    Option 1: 2 cups red lentil soup, green salad with 1 tbsp homemade vinaigrette (extra virgin olive oil and vinegar of choice plus spices)   Option 2: 3 oz. roasted chicken, 2 slices whole-grain bread, 2 tsp mayonnaise, mustard, lettuce, tomato if desired, 1 fruit (example: medium-sized apple or small pear)   Option 3: 3 oz. tuna packed in water, 1 whole-wheat pita (6 inch), 2 tsp mayonnaise, lettuce, tomato, or other non-starchy vegetable of your choice, 1 fruit (example:  medium-sized apple or small pear)   Option 4: 1 serving of garden veggie buddha bowl with lentils and tahini sauce and 1 cup berries topped with  cup plain 0% Greek yogurt   Dinner:    Option 1: 1 serving roasted cauliflower salad, 3-4 oz.  grilled or baked pork loin chop, 1/2 cup mashed potato, or brown rice or quinoa    Option 2: 1 serving fish (baked, grilled or air fried), green salad, 1 tbsp homemade vinaigrette,  cup cooked couscous   Option 3: 1 cup cooked whole grained pasta (example: spaghetti, spirals, macaroni),  cup favorite pasta sauce (preferably homemade), 3-4 oz.  grilled or baked chicken, green salad, 1 tbsp homemade vinaigrette   Option 4: 1 serving oven roasted salmon,  cup mashed sweet potato or couscous or brown rice or quinoa, broccoli (steamed or roasted)   Healthy snacks:    Carrots or celery with 1 tbsp of hummus    1 medium-sized fruit (apple or orange)   1 cup plain 0% [place] yogurt with  cup berries   Half apple, sliced, with 1 tbsp (15 mL) peanut or almond butter

## 2023-05-22 NOTE — Telephone Encounter (Signed)
 Pharmacy Patient Advocate Encounter   Received notification from Pt Calls Messages that prior authorization for repatha is required/requested.   Insurance verification completed.   The patient is insured through [employer] .   Per test claim: PA required; PA submitted to above mentioned insurance via CoverMyMeds Key/confirmation #/EOC [id number] Status is pending

## 2023-05-22 NOTE — Assessment & Plan Note (Signed)
 Assessment:  LDL goal: < 55 mg/dl last LDLc 80 mg/dl while off of all cholesterol meds for a month before lab TG is persistently elevated (10/2022 acute pancreatitis)   Tolerates Zetia and high intensity statins well without any side effects  Discussed next potential options (PCSK-9 inhibitors, bempedoic acid and inclisiran); cost, dosing efficacy, side effects  Reiterated importance of heart healthy diet and regular exercise   Plan: continue taking Atorvastatin 80 mg daily and Zetia 10 mg daily We will get updated lipid lab today, if LDL above the goal we will add Repatha injection to current cholesterol medications.  Start taking Vascepa 2 gm twice daily  Follow up lab due in 3 months

## 2023-05-23 ENCOUNTER — Other Ambulatory Visit: Payer: Self-pay

## 2023-05-23 ENCOUNTER — Telehealth: Payer: Self-pay | Admitting: Pharmacy Technician

## 2023-05-23 ENCOUNTER — Other Ambulatory Visit (HOSPITAL_COMMUNITY): Payer: Self-pay

## 2023-05-23 ENCOUNTER — Encounter: Payer: Self-pay | Admitting: Pharmacist

## 2023-05-23 MED ORDER — REPATHA SURECLICK 140 MG/ML ~~LOC~~ SOAJ
140.0000 mg | SUBCUTANEOUS | 3 refills | Status: AC
  Filled 2023-05-23 – 2023-05-24 (×2): qty 2, 28d supply, fill #0
  Filled 2023-06-09 – 2023-06-27 (×4): qty 2, 28d supply, fill #1
  Filled 2023-07-22: qty 2, 28d supply, fill #2
  Filled 2023-08-10: qty 2, 28d supply, fill #3
  Filled 2023-09-06: qty 2, 28d supply, fill #4
  Filled 2023-09-29 – 2023-10-20 (×3): qty 2, 28d supply, fill #5
  Filled 2023-11-20: qty 2, 28d supply, fill #6
  Filled 2023-12-20: qty 2, 28d supply, fill #7
  Filled 2024-02-10: qty 2, 28d supply, fill #8

## 2023-05-23 NOTE — Telephone Encounter (Signed)
 Pharmacy Patient Advocate Encounter  Received notification from [facility] MEDICAID that Prior Authorization for repatha has been APPROVED from 05/23/23 to 05/22/24   PA #/Case ID/Reference #: [id number]

## 2023-05-23 NOTE — Telephone Encounter (Signed)
 Call pt to inform about PA approval. N/A LVM

## 2023-05-23 NOTE — Telephone Encounter (Signed)
 Pharmacy Patient Advocate Encounter- patient has 2 insurances- [employer] approved and [facility] medicaid pending    Received notification from [employer] that prior authorization for repatha is required/requested.   Insurance verification completed.   The patient is insured through [facility] MEDICAID .   Per test claim: PA required; PA submitted to above mentioned insurance via CoverMyMeds Key/confirmation #/EOC [id number] Status is pending

## 2023-05-23 NOTE — Telephone Encounter (Signed)
 Patient is aware his repatha has been approved

## 2023-05-23 NOTE — Addendum Note (Signed)
 Addended by: Estefany Goebel K on: 05/23/2023 11:36 AM   Modules accepted: Orders

## 2023-05-23 NOTE — Telephone Encounter (Signed)
Pt returning nurse call. Please advise.

## 2023-05-23 NOTE — Telephone Encounter (Signed)
 Pharmacy Patient Advocate Encounter  Received notification from [employer] that Prior Authorization for repatha has been APPROVED from 05/22/23 to 05/22/24. Ran test claim, Copay is $24.99- one month. This test claim was processed through [facility]- copay amounts may vary at other pharmacies due to pharmacy/plan contracts, or as the patient moves through the different stages of their insurance plan.   PA #/Case ID/Reference #: [id number]

## 2023-05-24 ENCOUNTER — Other Ambulatory Visit: Payer: Self-pay

## 2023-05-27 ENCOUNTER — Other Ambulatory Visit: Payer: Self-pay

## 2023-05-28 NOTE — Telephone Encounter (Signed)
 PA approved see other encounter for more info

## 2023-05-30 ENCOUNTER — Telehealth: Payer: Self-pay | Admitting: Nurse Practitioner

## 2023-05-30 NOTE — Telephone Encounter (Signed)
 FYI Copied from CRM [id number]. Topic: General - Other  >> May 30, 2023  3:32 PM Taleah C wrote:  Reason for CRM: patient called to let the office know that he will come in tomorrow to pick up is prescription, for Farxiga , from the front office.

## 2023-05-31 ENCOUNTER — Other Ambulatory Visit: Payer: Self-pay

## 2023-05-31 NOTE — Telephone Encounter (Signed)
 See other encounter for more info on PA approval for Repatha .

## 2023-05-31 NOTE — Telephone Encounter (Signed)
 Noted.

## 2023-06-10 ENCOUNTER — Other Ambulatory Visit: Payer: Self-pay

## 2023-06-13 ENCOUNTER — Other Ambulatory Visit: Payer: Self-pay

## 2023-06-13 ENCOUNTER — Other Ambulatory Visit: Payer: Self-pay | Admitting: Nurse Practitioner

## 2023-06-13 DIAGNOSIS — E1165 Type 2 diabetes mellitus with hyperglycemia: Secondary | ICD-10-CM

## 2023-06-13 MED ORDER — OZEMPIC (1 MG/DOSE) 4 MG/3ML ~~LOC~~ SOPN
1.0000 mg | PEN_INJECTOR | SUBCUTANEOUS | 1 refills | Status: DC
  Filled 2023-06-13 – 2023-10-01 (×5): qty 3, 28d supply, fill #0

## 2023-06-14 ENCOUNTER — Other Ambulatory Visit: Payer: Self-pay

## 2023-06-17 ENCOUNTER — Other Ambulatory Visit: Payer: Self-pay

## 2023-06-18 ENCOUNTER — Other Ambulatory Visit: Payer: Self-pay

## 2023-06-19 ENCOUNTER — Other Ambulatory Visit: Payer: Self-pay

## 2023-06-21 ENCOUNTER — Other Ambulatory Visit: Payer: Self-pay

## 2023-06-24 ENCOUNTER — Other Ambulatory Visit: Payer: Self-pay

## 2023-06-26 ENCOUNTER — Other Ambulatory Visit: Payer: Self-pay

## 2023-06-27 ENCOUNTER — Other Ambulatory Visit: Payer: Self-pay

## 2023-06-27 ENCOUNTER — Encounter: Payer: Self-pay | Admitting: Nurse Practitioner

## 2023-06-28 ENCOUNTER — Other Ambulatory Visit (HOSPITAL_BASED_OUTPATIENT_CLINIC_OR_DEPARTMENT_OTHER): Payer: Self-pay

## 2023-06-28 ENCOUNTER — Other Ambulatory Visit: Payer: Self-pay

## 2023-06-28 ENCOUNTER — Other Ambulatory Visit (HOSPITAL_COMMUNITY): Payer: Self-pay

## 2023-07-04 ENCOUNTER — Other Ambulatory Visit: Payer: Self-pay

## 2023-07-15 ENCOUNTER — Other Ambulatory Visit: Payer: Self-pay

## 2023-07-15 ENCOUNTER — Ambulatory Visit: Attending: Nurse Practitioner | Admitting: Nurse Practitioner

## 2023-07-15 ENCOUNTER — Encounter: Payer: Self-pay | Admitting: Nurse Practitioner

## 2023-07-15 VITALS — BP 133/77 | HR 98 | Resp 19 | Ht 73.0 in | Wt 265.4 lb

## 2023-07-15 DIAGNOSIS — I700 Atherosclerosis of aorta: Secondary | ICD-10-CM

## 2023-07-15 DIAGNOSIS — E1165 Type 2 diabetes mellitus with hyperglycemia: Secondary | ICD-10-CM | POA: Diagnosis not present

## 2023-07-15 DIAGNOSIS — I10 Essential (primary) hypertension: Secondary | ICD-10-CM | POA: Diagnosis not present

## 2023-07-15 DIAGNOSIS — Z794 Long term (current) use of insulin: Secondary | ICD-10-CM | POA: Diagnosis not present

## 2023-07-15 LAB — POCT GLYCOSYLATED HEMOGLOBIN (HGB A1C): Hemoglobin A1C: 8.8 % — AB (ref 4.0–5.6)

## 2023-07-15 NOTE — Progress Notes (Signed)
 Assessment & Plan:   Riley Wiggins was seen today for diabetes.  Diagnoses and all orders for this visit:  Type 2 diabetes mellitus with hyperglycemia, with long-term current use of insulin  A1c not at goal. Needs better adherence with medications -     POCT glycosylated hemoglobin (Hb A1C)   Low calcium  levels -     CMP14+EGFR -     VITAMIN D 25 Hydroxy (Vit-D Deficiency, Fractures)  Primary hypertension -     CMP14+EGFR Continue all antihypertensives as prescribed.  Reminded to bring in blood pressure log for follow  up appointment.  RECOMMENDATIONS: DASH/Mediterranean Diets are healthier choices for HTN.    Aortic atherosclerosis  Continue atorvastatin , zetia  and start omega 3 if unable to afford vascepa  -     CMP14+EGFR    Patient has been counseled on age-appropriate routine health concerns for screening and prevention. These are reviewed and up-to-date. Referrals have been placed accordingly. Immunizations are up-to-date or declined.    Subjective:   Chief Complaint  Patient presents with   Diabetes    Riley Wiggins 58 y.o. male presents to office today for follow up to DM and HTN  Past medical history of HTN, T2DM, superior mesenteric vein thrombosis, and dyslipidemia   Followed by Cardiology and overdue for appointment. He has been instructed to follow up with Dr. Daril Wiggins office as soon as possible.     HTN Blood pressure is well controlled. He is currently prescribed Diovan -HCT160-25 mg daily.  BP Readings from Last 3 Encounters:  07/15/23 133/77  04/16/23 130/85  03/18/23 118/74     DM 2 No improvement in A1c today. He is currently prescribed ozempic  1 mg weekly, basaglar  28 units daily,  farxiga  10 mg daily. He has been out of farxiga  and ozempic  for over a month due to cost Lab Results  Component Value Date   HGBA1C 8.8 (A) 07/15/2023    Lab Results  Component Value Date   HGBA1C 8.8 (A) 04/16/2023  LDL not at goal. He is currently prescribed  high intensity statin, vascepa  and zetia . OOP cost for vascepa  was too high. I recommended over the counter omega 3 if he can not afford vascepa  Lab Results  Component Value Date   LDLCALC 82 05/22/2023     Review of Systems  Constitutional:  Negative for fever, malaise/fatigue and weight loss.  HENT: Negative.  Negative for nosebleeds.   Eyes: Negative.  Negative for blurred vision, double vision and photophobia.  Respiratory: Negative.  Negative for cough and shortness of breath.   Cardiovascular: Negative.  Negative for chest pain, palpitations and leg swelling.  Gastrointestinal: Negative.  Negative for heartburn, nausea and vomiting.  Musculoskeletal: Negative.  Negative for myalgias.  Neurological: Negative.  Negative for dizziness, focal weakness, seizures and headaches.  Psychiatric/Behavioral: Negative.  Negative for suicidal ideas.     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     Past Surgical History:  Procedure Laterality Date   HERNIA REPAIR     " when I was young "    Family History  Problem Relation Age of Onset   Diabetes Mother    Diabetes Father    Pancreatitis Neg Hx    Colon cancer Neg Hx    Stomach cancer  Neg Hx     Social History Reviewed with no changes to be made today.   Outpatient Medications Prior to Visit  Medication Sig Dispense Refill   Accu-Chek Softclix Lancets lancets Use to check blood sugar 3 times daily. 100 each 6   allopurinol  (ZYLOPRIM ) 100 MG tablet Take 1 tablet (100 mg total) by mouth daily. 90 tablet 1   apixaban  (ELIQUIS ) 5 MG TABS tablet Take 1 tablet (5 mg total) by mouth 2 (two) times daily. 180 tablet 2   atorvastatin  (LIPITOR) 80 MG tablet Take 1 tablet (80 mg total) by mouth daily. 90  tablet 3   Blood Glucose Monitoring Suppl (ACCU-CHEK GUIDE) w/Device KIT Use to check blood sugar 3 times daily. 1 kit 0   dapagliflozin  propanediol (FARXIGA ) 10 MG TABS tablet Take 1 tablet (10 mg total) by mouth daily before breakfast. 90 tablet 1   Evolocumab  (REPATHA  SURECLICK) 140 MG/ML SOAJ Inject 140 mg into the skin every 14 (fourteen) days. 6 mL 3   ezetimibe  (ZETIA ) 10 MG tablet Take 1 tablet by mouth once daily 90 tablet 3   Garlic 1200 MG CAPS Take 1,200 mg by mouth daily.     glucose blood (ACCU-CHEK GUIDE TEST) test strip Use to check blood sugar 3 times daily. 100 each 6   icosapent  Ethyl (VASCEPA ) 1 g capsule Take 2 capsules (2 g total) by mouth 2 (two) times daily. 120 capsule 11   Insulin  Glargine (BASAGLAR  KWIKPEN) 100 UNIT/ML Inject 28 Units into the skin daily. 15 mL PRN   Insulin  Pen Needle (TECHLITE PEN NEEDLES) 31G X 5 MM MISC Use as instructed. Inject into the skin once daily. 100 each 3   Multiple Vitamins-Minerals (MULTI FOR HIM 50+) TABS Take 1 tablet by mouth daily.     Semaglutide , 1 MG/DOSE, (OZEMPIC , 1 MG/DOSE,) 4 MG/3ML SOPN Inject 1 mg as directed once a week. 9 mL 1   valsartan -hydrochlorothiazide  (DIOVAN -HCT) 160-25 MG tablet Take 1 tablet by mouth daily. 90 tablet 3   acetaminophen  (TYLENOL ) 500 MG tablet Take 1,000 mg by mouth every 6 (six) hours as needed for mild pain (pain score 1-3).     Ascorbic Acid (VITAMIN C) 1000 MG tablet Take 1,000 mg by mouth daily.     Blood Pressure Monitor DEVI Please provide patient with insurance approved blood pressure monitor 1 each 0   Continuous Glucose Sensor (FREESTYLE LIBRE 3 SENSOR) MISC Place 1 sensor on the skin every 14 days. Use to check glucose continuously. (Patient not taking: Reported on 07/15/2023) 2 each 3   sildenafil  (VIAGRA ) 100 MG tablet Take 0.5-1 tablets (50-100 mg total) by mouth daily as needed for erectile dysfunction. (Patient not taking: Reported on 07/15/2023) 5 tablet 11   No facility-administered  medications prior to visit.    No Known Allergies     Objective:    BP 133/77 (BP Location: Left Arm, Patient Position: Sitting, Cuff Size: Large)   Pulse 98   Resp 19   Ht 6\' 1"  (1.854 m)   Wt 265 lb 6.4 oz (120.4 kg)   SpO2 98%   BMI 35.02 kg/m  Wt Readings from Last 3 Encounters:  07/15/23 265 lb 6.4 oz (120.4 kg)  04/16/23 269 lb 9.6 oz (122.3 kg)  03/18/23 272 lb (123.4 kg)    Physical Exam Vitals and nursing note reviewed.  Constitutional:      Appearance: He is well-developed.  HENT:     Head: Normocephalic and atraumatic.  Cardiovascular:  Rate and Rhythm: Normal rate and regular rhythm.     Heart sounds: Normal heart sounds. No murmur heard.    No friction rub. No gallop.  Pulmonary:     Effort: Pulmonary effort is normal. No tachypnea or respiratory distress.     Breath sounds: Normal breath sounds. No decreased breath sounds, wheezing, rhonchi or rales.  Chest:     Chest wall: No tenderness.  Abdominal:     General: Bowel sounds are normal.     Palpations: Abdomen is soft.  Musculoskeletal:        General: Normal range of motion.     Cervical back: Normal range of motion.  Skin:    General: Skin is warm and dry.  Neurological:     Mental Status: He is alert and oriented to person, place, and time.     Coordination: Coordination normal.  Psychiatric:        Behavior: Behavior normal. Behavior is cooperative.        Thought Content: Thought content normal.        Judgment: Judgment normal.          Patient has been counseled extensively about nutrition and exercise as well as the importance of adherence with medications and regular follow-up. The patient was given clear instructions to go to ER or return to medical center if symptoms don't improve, worsen or new problems develop. The patient verbalized understanding.   Follow-up: Return in about 3 months (around 10/18/2023).   Collins Dean, FNP-BC [facility]  [place], [place] [phone]   07/15/2023, 9:55 AM

## 2023-07-16 LAB — CMP14+EGFR
ALT: 28 IU/L (ref 0–44)
AST: 37 IU/L (ref 0–40)
Albumin: 4.8 g/dL (ref 3.8–4.9)
Alkaline Phosphatase: 84 IU/L (ref 44–121)
BUN/Creatinine Ratio: 16 (ref 9–20)
BUN: 19 mg/dL (ref 6–24)
Bilirubin Total: 0.3 mg/dL (ref 0.0–1.2)
CO2: 17 mmol/L — ABNORMAL LOW (ref 20–29)
Calcium: 10.2 mg/dL (ref 8.7–10.2)
Chloride: 95 mmol/L — ABNORMAL LOW (ref 96–106)
Creatinine, Ser: 1.17 mg/dL (ref 0.76–1.27)
Globulin, Total: 2.4 g/dL (ref 1.5–4.5)
Glucose: 183 mg/dL — ABNORMAL HIGH (ref 70–99)
Potassium: 3.8 mmol/L (ref 3.5–5.2)
Sodium: 137 mmol/L (ref 134–144)
Total Protein: 7.2 g/dL (ref 6.0–8.5)
eGFR: 72 mL/min/1.73

## 2023-07-16 LAB — VITAMIN D 25 HYDROXY (VIT D DEFICIENCY, FRACTURES): Vit D, 25-Hydroxy: 19.7 ng/mL — ABNORMAL LOW (ref 30.0–100.0)

## 2023-07-20 ENCOUNTER — Other Ambulatory Visit: Payer: Self-pay

## 2023-07-22 ENCOUNTER — Ambulatory Visit: Payer: Self-pay | Admitting: Nurse Practitioner

## 2023-07-22 ENCOUNTER — Other Ambulatory Visit: Payer: Self-pay | Admitting: Nurse Practitioner

## 2023-07-22 DIAGNOSIS — E559 Vitamin D deficiency, unspecified: Secondary | ICD-10-CM

## 2023-07-22 MED ORDER — VITAMIN D (ERGOCALCIFEROL) 1.25 MG (50000 UNIT) PO CAPS
50000.0000 [IU] | ORAL_CAPSULE | ORAL | 1 refills | Status: DC
  Filled 2023-07-22: qty 4, 28d supply, fill #0
  Filled 2023-08-24: qty 4, 28d supply, fill #1
  Filled 2023-09-29: qty 4, 28d supply, fill #2
  Filled 2023-10-20 – 2023-11-01 (×2): qty 4, 28d supply, fill #3
  Filled 2023-12-10: qty 4, 28d supply, fill #4
  Filled 2024-01-12: qty 4, 28d supply, fill #5

## 2023-07-23 ENCOUNTER — Other Ambulatory Visit: Payer: Self-pay

## 2023-07-24 ENCOUNTER — Other Ambulatory Visit: Payer: Self-pay

## 2023-07-25 ENCOUNTER — Other Ambulatory Visit: Payer: Self-pay

## 2023-08-12 ENCOUNTER — Other Ambulatory Visit: Payer: Self-pay

## 2023-08-15 ENCOUNTER — Telehealth: Payer: Self-pay | Admitting: Pharmacist

## 2023-08-15 NOTE — Telephone Encounter (Signed)
 Repatha  and Vascepa  was added to Statin and Zetia . Call pt to remind about follow up lipid lab. Pt states he will come to our location Monday morning for the fasting lipid lab

## 2023-08-16 ENCOUNTER — Other Ambulatory Visit: Payer: Self-pay

## 2023-08-19 LAB — LIPID PANEL
Chol/HDL Ratio: 1.6 ratio (ref 0.0–5.0)
Cholesterol, Total: 101 mg/dL (ref 100–199)
HDL: 62 mg/dL (ref >39)
LDL Chol Calc (NIH): 7 mg/dL (ref 0–99)
Triglycerides: 219 mg/dL — ABNORMAL HIGH (ref 0–149)
VLDL Cholesterol Cal: 32 mg/dL (ref 5–40)

## 2023-08-21 ENCOUNTER — Telehealth: Payer: Self-pay | Admitting: Pharmacy Technician

## 2023-08-21 ENCOUNTER — Other Ambulatory Visit: Payer: Self-pay

## 2023-08-21 ENCOUNTER — Other Ambulatory Visit (HOSPITAL_COMMUNITY): Payer: Self-pay

## 2023-08-21 MED ORDER — ATORVASTATIN CALCIUM 40 MG PO TABS
40.0000 mg | ORAL_TABLET | Freq: Every day | ORAL | 3 refills | Status: AC
  Filled 2023-08-21: qty 90, 90d supply, fill #0
  Filled 2023-09-29: qty 30, 30d supply, fill #0
  Filled 2023-10-20 – 2023-11-20 (×2): qty 30, 30d supply, fill #1
  Filled 2023-12-20: qty 30, 30d supply, fill #2
  Filled 2024-01-12: qty 30, 30d supply, fill #3

## 2023-08-21 NOTE — Addendum Note (Signed)
 Addended by: Benedict Kue K on: 08/21/2023 08:04 AM   Modules accepted: Orders

## 2023-08-21 NOTE — Telephone Encounter (Signed)
 Call to discuss lipid lab. N/A LVM and pt called back. Pt has new insurance and Vascepa  and Ozempic  are cost prohibitive so pt just used up whatever supply he had. ( Off of both from last few weeks) given such a low LDL advised to stop Zetia  and reduce dose of Atorvastatin  from 80 mg to 40 mg. Will continue Repatha  Pt verbalize understanding. Will assess coverage for Ozempic  and Vascepa .

## 2023-08-21 NOTE — Telephone Encounter (Signed)
   Ozempic  would be 444.75 for 28 days with a note evoucher not allowed. Copay outside of threshold range. I got him a coupon and cost would be 344.75 on insurance and the coupon.    Got coupon

## 2023-08-21 NOTE — Telephone Encounter (Signed)
   Icosapent  1g is ready for the patient for 75.14 for 30 days at [facility]

## 2023-08-22 NOTE — Telephone Encounter (Signed)
 Pt has been informed about the price and he can afford generic Vascepa  but he can not afford Ozempic  so he will be talking to his PCP to change GLP1 to something else.

## 2023-08-26 ENCOUNTER — Other Ambulatory Visit: Payer: Self-pay

## 2023-09-02 ENCOUNTER — Other Ambulatory Visit: Payer: Self-pay

## 2023-09-02 ENCOUNTER — Other Ambulatory Visit (HOSPITAL_COMMUNITY): Payer: Self-pay

## 2023-09-06 ENCOUNTER — Other Ambulatory Visit: Payer: Self-pay

## 2023-09-13 ENCOUNTER — Encounter: Payer: Self-pay | Admitting: Nurse Practitioner

## 2023-09-30 ENCOUNTER — Other Ambulatory Visit: Payer: Self-pay

## 2023-10-01 ENCOUNTER — Other Ambulatory Visit: Payer: Self-pay

## 2023-10-11 ENCOUNTER — Other Ambulatory Visit: Payer: Self-pay

## 2023-10-14 ENCOUNTER — Other Ambulatory Visit: Payer: Self-pay

## 2023-10-20 ENCOUNTER — Other Ambulatory Visit: Payer: Self-pay | Admitting: Nurse Practitioner

## 2023-10-20 DIAGNOSIS — E1165 Type 2 diabetes mellitus with hyperglycemia: Secondary | ICD-10-CM

## 2023-10-21 ENCOUNTER — Other Ambulatory Visit: Payer: Self-pay

## 2023-10-21 ENCOUNTER — Ambulatory Visit: Attending: Nurse Practitioner | Admitting: Nurse Practitioner

## 2023-10-21 DIAGNOSIS — I10 Essential (primary) hypertension: Secondary | ICD-10-CM

## 2023-10-21 MED ORDER — DAPAGLIFLOZIN PROPANEDIOL 10 MG PO TABS
10.0000 mg | ORAL_TABLET | Freq: Every day | ORAL | 0 refills | Status: DC
  Filled 2023-10-21: qty 30, 30d supply, fill #0
  Filled 2023-11-20: qty 30, 30d supply, fill #1

## 2023-10-21 MED ORDER — TECHLITE PEN NEEDLES 31G X 5 MM MISC
0 refills | Status: AC
  Filled 2023-10-21: qty 100, 30d supply, fill #0

## 2023-10-21 NOTE — Progress Notes (Signed)
 No show

## 2023-10-22 ENCOUNTER — Other Ambulatory Visit: Payer: Self-pay

## 2023-10-24 ENCOUNTER — Other Ambulatory Visit: Payer: Self-pay

## 2023-10-25 ENCOUNTER — Other Ambulatory Visit: Payer: Self-pay

## 2023-10-28 ENCOUNTER — Other Ambulatory Visit: Payer: Self-pay

## 2023-11-05 ENCOUNTER — Other Ambulatory Visit: Payer: Self-pay

## 2023-11-12 ENCOUNTER — Ambulatory Visit: Admitting: Nurse Practitioner

## 2023-11-22 ENCOUNTER — Other Ambulatory Visit: Payer: Self-pay

## 2023-11-29 ENCOUNTER — Ambulatory Visit: Attending: Nurse Practitioner | Admitting: Nurse Practitioner

## 2023-11-29 ENCOUNTER — Encounter: Payer: Self-pay | Admitting: Nurse Practitioner

## 2023-11-29 ENCOUNTER — Other Ambulatory Visit: Payer: Self-pay

## 2023-11-29 VITALS — BP 133/86 | HR 100 | Resp 19 | Ht 73.0 in | Wt 253.0 lb

## 2023-11-29 DIAGNOSIS — I10 Essential (primary) hypertension: Secondary | ICD-10-CM | POA: Diagnosis not present

## 2023-11-29 DIAGNOSIS — Z7984 Long term (current) use of oral hypoglycemic drugs: Secondary | ICD-10-CM | POA: Diagnosis not present

## 2023-11-29 DIAGNOSIS — E119 Type 2 diabetes mellitus without complications: Secondary | ICD-10-CM | POA: Diagnosis not present

## 2023-11-29 DIAGNOSIS — M1A9XX0 Chronic gout, unspecified, without tophus (tophi): Secondary | ICD-10-CM | POA: Diagnosis not present

## 2023-11-29 DIAGNOSIS — R350 Frequency of micturition: Secondary | ICD-10-CM

## 2023-11-29 DIAGNOSIS — Z794 Long term (current) use of insulin: Secondary | ICD-10-CM

## 2023-11-29 DIAGNOSIS — E559 Vitamin D deficiency, unspecified: Secondary | ICD-10-CM

## 2023-11-29 LAB — POCT GLYCOSYLATED HEMOGLOBIN (HGB A1C): Hemoglobin A1C: 8.2 % — AB (ref 4.0–5.6)

## 2023-11-29 MED ORDER — TRULICITY 0.75 MG/0.5ML ~~LOC~~ SOAJ
0.7500 mg | SUBCUTANEOUS | 0 refills | Status: AC
  Filled 2023-11-29: qty 2, 28d supply, fill #0
  Filled 2024-01-12: qty 2, 28d supply, fill #1
  Filled 2024-02-10: qty 2, 28d supply, fill #2

## 2023-11-29 MED ORDER — VALSARTAN-HYDROCHLOROTHIAZIDE 160-25 MG PO TABS
1.0000 | ORAL_TABLET | Freq: Every day | ORAL | 3 refills | Status: DC
  Filled 2023-11-29: qty 90, 90d supply, fill #0
  Filled 2023-12-20: qty 30, 30d supply, fill #0
  Filled 2024-01-12: qty 30, 30d supply, fill #1

## 2023-11-29 MED ORDER — BASAGLAR KWIKPEN 100 UNIT/ML ~~LOC~~ SOPN
30.0000 [IU] | PEN_INJECTOR | Freq: Every day | SUBCUTANEOUS | 99 refills | Status: AC
  Filled 2023-11-29: qty 15, 50d supply, fill #0
  Filled 2023-12-12: qty 9, 30d supply, fill #0
  Filled 2024-01-12: qty 9, 30d supply, fill #1
  Filled 2024-02-05: qty 9, 30d supply, fill #2

## 2023-11-29 MED ORDER — FREESTYLE LIBRE 3 PLUS SENSOR MISC
6 refills | Status: AC
  Filled 2023-11-29 – 2024-02-10 (×2): qty 2, 30d supply, fill #0

## 2023-11-29 MED ORDER — ALLOPURINOL 100 MG PO TABS
100.0000 mg | ORAL_TABLET | Freq: Every day | ORAL | 1 refills | Status: AC
  Filled 2023-11-29 – 2024-01-12 (×2): qty 90, 90d supply, fill #0
  Filled 2024-02-10: qty 30, 30d supply, fill #1

## 2023-11-29 MED ORDER — DAPAGLIFLOZIN PROPANEDIOL 5 MG PO TABS
10.0000 mg | ORAL_TABLET | Freq: Every day | ORAL | 3 refills | Status: DC
  Filled 2023-11-29 – 2023-12-12 (×3): qty 60, 30d supply, fill #0

## 2023-11-29 NOTE — Patient Instructions (Addendum)
 Increase basaglar  to 30 units  If Trulicity  not covered please let me know. Send me a mychart message 1 week prior to Trulicity  running out so I can send a new dose.  If farxiga  not covered please let me know and we will send jardiance

## 2023-11-29 NOTE — Progress Notes (Signed)
 Assessment & Plan:  Riley Wiggins was seen today for diabetes.  Diagnoses and all orders for this visit:  Diabetes mellitus treated with insulin  and oral medication (HCC) -     POCT glycosylated hemoglobin (Hb A1C) -     Dulaglutide  (TRULICITY ) 0.75 MG/0.5ML SOAJ; Inject 0.75 mg into the skin once a week. -     dapagliflozin  propanediol (FARXIGA ) 5 MG TABS tablet; Take 2 tablets (10 mg total) by mouth daily. -     Insulin  Glargine (BASAGLAR  KWIKPEN) 100 UNIT/ML; Inject 30 Units into the skin daily. -     Continuous Glucose Sensor (FREESTYLE LIBRE 3 PLUS SENSOR) MISC; Change sensor every 15 days. -     CMP14+EGFR Managed with Basaglar  and Farxiga . Transitioning from Ozempic  to Trulicity  due to insurance issues. Previous Trulicity  availability issues resolved. - Initiate Trulicity  0.75 mg weekly. Increase dose every four weeks to match previous Ozempic  dose. - Increase Basaglar  to 30 units daily. - Continue Farxiga  10 mg daily. Monitor insurance coverage. - Switch to Jardiance if Farxiga  not covered. - Instruct to notify if Trulicity  not covered. - Order kidney function and vitamin D  level tests.  Primary hypertension -     valsartan -hydrochlorothiazide  (DIOVAN -HCT) 160-25 MG tablet; Take 1 tablet by mouth daily. Continue Diovan  HCT as prescribed.  Reminded to bring in blood pressure log for follow  up appointment.  RECOMMENDATIONS: DASH/Mediterranean Diets are healthier choices for HTN.     Chronic gout without tophus, unspecified cause, unspecified site -     allopurinol  (ZYLOPRIM ) 100 MG tablet; Take 1 tablet (100 mg total) by mouth daily.  Increased urinary frequency -     PSA  Vitamin D  deficiency disease -     VITAMIN D  25 Hydroxy (Vit-D Deficiency, Fractures)    Patient has been counseled on age-appropriate routine health concerns for screening and prevention. These are reviewed and up-to-date. Referrals have been placed accordingly. Immunizations are up-to-date or declined.     Subjective:   Chief Complaint  Patient presents with   Diabetes  History of Present Illness Riley Wiggins is a 58 year old male with diabetes who presents for medication management and follow-up to diabetes and hypertension.  Past medical history of HTN, T2DM, superior mesenteric vein thrombosis, and dyslipidemia   Followed by Cardiology and overdue for appointment. He has been instructed to follow up with Dr. Milan office as soon as possible.    HTN Blood pressure at goal.  He is taking Diovan  HCT 160-25 mg daily as prescribed. BP Readings from Last 3 Encounters:  11/29/23 133/86  07/15/23 133/77  04/16/23 130/85    DM2 A1c is not at goal.  He could not afford Ozempic  so we will switch to Trulicity .  It appears his insurance no longer wants to cover Farxiga  10 mg tablet but will cover 5 mg tablet so we will make that adjustment today.  Basaglar  will be increased from 28 to 30 units daily. He has several Freestyle sensors at home for glucose monitoring but has concerns due to information he heard about potential issues, which he cannot recall. Lab Results  Component Value Date   HGBA1C 8.2 (A) 11/29/2023     He is managing his cholesterol with Repatha , which he finds painful but tolerable as it is administered bi-weekly. He previously used Zetia , which was discontinued by his cardiologist. He is taking over-the-counter fish oil instead of the prescribed Vasepa due to cost concerns, as [name] is not covered by his insurance.  He has  not received a flu or pneumonia vaccine, citing adverse reactions in the past. His wife had a severe reaction to the pneumonia vaccine.   Review of Systems  Constitutional:  Negative for fever, malaise/fatigue and weight loss.  HENT: Negative.  Negative for nosebleeds.   Eyes: Negative.  Negative for blurred vision, double vision and photophobia.  Respiratory: Negative.  Negative for cough and shortness of breath.   Cardiovascular:  Negative.  Negative for chest pain, palpitations and leg swelling.  Gastrointestinal: Negative.  Negative for heartburn, nausea and vomiting.  Genitourinary:  Positive for frequency.  Musculoskeletal: Negative.  Negative for myalgias.  Neurological: Negative.  Negative for dizziness, focal weakness, seizures and headaches.  Psychiatric/Behavioral: Negative.  Negative for suicidal ideas.     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     Past Surgical History:  Procedure Laterality Date   HERNIA REPAIR      when I was young     Family History  Problem Relation Age of Onset   Diabetes Mother    Diabetes Father    Pancreatitis Neg Hx    Colon cancer Neg Hx    Stomach cancer Neg Hx     Social History Reviewed with no changes to be made today.   Outpatient Medications Prior to Visit  Medication Sig Dispense Refill   Accu-Chek Softclix Lancets lancets Use to check blood sugar 3 times daily. 100 each 6   apixaban  (ELIQUIS ) 5 MG TABS tablet Take 1 tablet (5 mg total) by mouth 2 (two) times daily. 180 tablet 2   atorvastatin  (LIPITOR) 40 MG tablet Take 1 tablet (40 mg total) by mouth daily. 90 tablet 3   Blood Glucose Monitoring Suppl (ACCU-CHEK GUIDE) w/Device KIT Use to check blood sugar 3 times daily. 1 kit 0   Evolocumab  (REPATHA  SURECLICK) 140 MG/ML SOAJ Inject 140 mg into the skin every 14 (fourteen) days. 6 mL 3   Garlic 1200 MG CAPS Take 1,200 mg by mouth daily.     glucose blood (ACCU-CHEK GUIDE TEST) test strip Use to check blood sugar 3 times daily. 100 each 6   Insulin  Pen Needle (TECHLITE PEN NEEDLES) 31G X 5 MM MISC Use as instructed to inject insulin  100 each 0   Multiple Vitamins-Minerals (MULTI FOR HIM 50+) TABS Take 1 tablet by mouth  daily.     Vitamin D , Ergocalciferol , (DRISDOL ) 1.25 MG (50000 UNIT) CAPS capsule Take 1 capsule (50,000 Units total) by mouth every 7 (seven) days. 12 capsule 1   allopurinol  (ZYLOPRIM ) 100 MG tablet Take 1 tablet (100 mg total) by mouth daily. 90 tablet 1   dapagliflozin  propanediol (FARXIGA ) 10 MG TABS tablet Take 1 tablet (10 mg total) by mouth daily before breakfast. 90 tablet 0   icosapent  Ethyl (VASCEPA ) 1 g capsule Take 2 capsules (2 g total) by mouth 2 (two) times daily. 120 capsule 11   Insulin  Glargine (BASAGLAR  KWIKPEN) 100 UNIT/ML Inject 28 Units into the skin daily. 15 mL PRN   Semaglutide , 1 MG/DOSE, (OZEMPIC , 1 MG/DOSE,) 4 MG/3ML SOPN Inject 1 mg as directed once a week. 9 mL 1   valsartan -hydrochlorothiazide  (DIOVAN -HCT) 160-25 MG tablet Take 1 tablet by mouth daily. 90 tablet 3   Continuous Glucose Sensor (FREESTYLE LIBRE 3  SENSOR) MISC Place 1 sensor on the skin every 14 days. Use to check glucose continuously. (Patient not taking: Reported on 11/29/2023) 2 each 3   sildenafil  (VIAGRA ) 100 MG tablet Take 0.5-1 tablets (50-100 mg total) by mouth daily as needed for erectile dysfunction. (Patient not taking: Reported on 11/29/2023) 5 tablet 11   No facility-administered medications prior to visit.    No Known Allergies     Objective:    BP 133/86 (BP Location: Left Arm, Patient Position: Sitting, Cuff Size: Large)   Pulse 100   Resp 19   Ht 6' 1 (1.854 m)   Wt 253 lb (114.8 kg)   SpO2 100%   BMI 33.38 kg/m  Wt Readings from Last 3 Encounters:  11/29/23 253 lb (114.8 kg)  07/15/23 265 lb 6.4 oz (120.4 kg)  04/16/23 269 lb 9.6 oz (122.3 kg)    Physical Exam Vitals and nursing note reviewed.  Constitutional:      Appearance: He is well-developed.  HENT:     Head: Normocephalic and atraumatic.  Cardiovascular:     Rate and Rhythm: Normal rate and regular rhythm.     Heart sounds: Normal heart sounds. No murmur heard.    No friction rub. No gallop.  Pulmonary:      Effort: Pulmonary effort is normal. No tachypnea or respiratory distress.     Breath sounds: Normal breath sounds. No decreased breath sounds, wheezing, rhonchi or rales.  Chest:     Chest wall: No tenderness.  Musculoskeletal:        General: Normal range of motion.     Cervical back: Normal range of motion.  Skin:    General: Skin is warm and dry.  Neurological:     Mental Status: He is alert and oriented to person, place, and time.     Coordination: Coordination normal.  Psychiatric:        Behavior: Behavior normal. Behavior is cooperative.        Thought Content: Thought content normal.        Judgment: Judgment normal.          Patient has been counseled extensively about nutrition and exercise as well as the importance of adherence with medications and regular follow-up. The patient was given clear instructions to go to ER or return to medical center if symptoms don't improve, worsen or new problems develop. The patient verbalized understanding.   Follow-up: Return in about 14 weeks (around 03/06/2024).   Haze LELON Servant, FNP-BC [facility] and [facility] [place], [place] [phone]   11/29/2023, 10:34 AM

## 2023-11-30 LAB — CMP14+EGFR
ALT: 30 IU/L (ref 0–44)
AST: 30 IU/L (ref 0–40)
Albumin: 5 g/dL — ABNORMAL HIGH (ref 3.8–4.9)
Alkaline Phosphatase: 77 IU/L (ref 47–123)
BUN/Creatinine Ratio: 22 — ABNORMAL HIGH (ref 9–20)
BUN: 25 mg/dL — ABNORMAL HIGH (ref 6–24)
Bilirubin Total: 1 mg/dL (ref 0.0–1.2)
CO2: 21 mmol/L (ref 20–29)
Calcium: 10.6 mg/dL — ABNORMAL HIGH (ref 8.7–10.2)
Chloride: 95 mmol/L — ABNORMAL LOW (ref 96–106)
Creatinine, Ser: 1.16 mg/dL (ref 0.76–1.27)
Globulin, Total: 2.7 g/dL (ref 1.5–4.5)
Glucose: 181 mg/dL — ABNORMAL HIGH (ref 70–99)
Potassium: 4.1 mmol/L (ref 3.5–5.2)
Sodium: 137 mmol/L (ref 134–144)
Total Protein: 7.7 g/dL (ref 6.0–8.5)
eGFR: 73 mL/min/1.73 (ref >59)

## 2023-11-30 LAB — PSA: Prostate Specific Ag, Serum: 1.7 ng/mL (ref 0.0–4.0)

## 2023-11-30 LAB — VITAMIN D 25 HYDROXY (VIT D DEFICIENCY, FRACTURES): Vit D, 25-Hydroxy: 30.9 ng/mL (ref 30.0–100.0)

## 2023-12-02 ENCOUNTER — Ambulatory Visit: Admitting: Physician Assistant

## 2023-12-03 ENCOUNTER — Ambulatory Visit: Payer: Self-pay | Admitting: Nurse Practitioner

## 2023-12-06 ENCOUNTER — Other Ambulatory Visit: Payer: Self-pay

## 2023-12-10 ENCOUNTER — Other Ambulatory Visit: Payer: Self-pay

## 2023-12-12 ENCOUNTER — Other Ambulatory Visit: Payer: Self-pay | Admitting: Pharmacist

## 2023-12-12 ENCOUNTER — Other Ambulatory Visit: Payer: Self-pay

## 2023-12-12 MED ORDER — DAPAGLIFLOZIN PROPANEDIOL 10 MG PO TABS
10.0000 mg | ORAL_TABLET | Freq: Every day | ORAL | 3 refills | Status: AC
  Filled 2023-12-12: qty 90, 90d supply, fill #0
  Filled 2023-12-20 (×2): qty 30, 30d supply, fill #0
  Filled 2024-01-12: qty 30, 30d supply, fill #1
  Filled 2024-02-05: qty 30, 30d supply, fill #2

## 2023-12-13 ENCOUNTER — Other Ambulatory Visit: Payer: Self-pay

## 2023-12-20 ENCOUNTER — Other Ambulatory Visit (INDEPENDENT_AMBULATORY_CARE_PROVIDER_SITE_OTHER): Payer: Self-pay | Admitting: Primary Care

## 2023-12-20 ENCOUNTER — Other Ambulatory Visit: Payer: Self-pay

## 2023-12-20 ENCOUNTER — Other Ambulatory Visit: Payer: Self-pay | Admitting: Nurse Practitioner

## 2023-12-20 DIAGNOSIS — K55069 Acute infarction of intestine, part and extent unspecified: Secondary | ICD-10-CM

## 2023-12-23 ENCOUNTER — Other Ambulatory Visit: Payer: Self-pay

## 2023-12-23 MED ORDER — APIXABAN 5 MG PO TABS
5.0000 mg | ORAL_TABLET | Freq: Two times a day (BID) | ORAL | 2 refills | Status: AC
  Filled 2023-12-23: qty 60, 30d supply, fill #0
  Filled 2024-02-10 – 2024-02-11 (×2): qty 60, 30d supply, fill #1

## 2024-01-12 ENCOUNTER — Other Ambulatory Visit: Payer: Self-pay

## 2024-01-13 ENCOUNTER — Other Ambulatory Visit: Payer: Self-pay

## 2024-01-16 ENCOUNTER — Other Ambulatory Visit: Payer: Self-pay

## 2024-01-28 ENCOUNTER — Emergency Department (HOSPITAL_COMMUNITY)

## 2024-01-28 ENCOUNTER — Other Ambulatory Visit: Payer: Self-pay

## 2024-01-28 ENCOUNTER — Inpatient Hospital Stay (HOSPITAL_COMMUNITY)
Admission: EM | Admit: 2024-01-28 | Discharge: 2024-01-30 | Disposition: A | Discharge status: Home / Self Care | Attending: Internal Medicine | Admitting: Internal Medicine

## 2024-01-28 ENCOUNTER — Encounter (HOSPITAL_COMMUNITY): Payer: Self-pay | Admitting: Radiology

## 2024-01-28 DIAGNOSIS — E785 Hyperlipidemia, unspecified: Secondary | ICD-10-CM | POA: Diagnosis present

## 2024-01-28 DIAGNOSIS — I10 Essential (primary) hypertension: Secondary | ICD-10-CM | POA: Diagnosis present

## 2024-01-28 DIAGNOSIS — Z87891 Personal history of nicotine dependence: Secondary | ICD-10-CM

## 2024-01-28 DIAGNOSIS — K859 Acute pancreatitis without necrosis or infection, unspecified: Secondary | ICD-10-CM | POA: Diagnosis not present

## 2024-01-28 DIAGNOSIS — E1165 Type 2 diabetes mellitus with hyperglycemia: Secondary | ICD-10-CM | POA: Diagnosis present

## 2024-01-28 DIAGNOSIS — Z833 Family history of diabetes mellitus: Secondary | ICD-10-CM

## 2024-01-28 DIAGNOSIS — Z7901 Long term (current) use of anticoagulants: Secondary | ICD-10-CM

## 2024-01-28 DIAGNOSIS — Z7985 Long-term (current) use of injectable non-insulin antidiabetic drugs: Secondary | ICD-10-CM

## 2024-01-28 DIAGNOSIS — Z860101 Personal history of adenomatous and serrated colon polyps: Secondary | ICD-10-CM

## 2024-01-28 DIAGNOSIS — Z6837 Body mass index (BMI) 37.0-37.9, adult: Secondary | ICD-10-CM

## 2024-01-28 DIAGNOSIS — E871 Hypo-osmolality and hyponatremia: Secondary | ICD-10-CM | POA: Diagnosis present

## 2024-01-28 DIAGNOSIS — Z1152 Encounter for screening for COVID-19: Secondary | ICD-10-CM

## 2024-01-28 DIAGNOSIS — R140 Abdominal distension (gaseous): Secondary | ICD-10-CM | POA: Diagnosis present

## 2024-01-28 DIAGNOSIS — K861 Other chronic pancreatitis: Secondary | ICD-10-CM | POA: Diagnosis present

## 2024-01-28 DIAGNOSIS — K8581 Other acute pancreatitis with uninfected necrosis: Principal | ICD-10-CM | POA: Diagnosis present

## 2024-01-28 DIAGNOSIS — D751 Secondary polycythemia: Secondary | ICD-10-CM | POA: Diagnosis present

## 2024-01-28 DIAGNOSIS — Z86718 Personal history of other venous thrombosis and embolism: Secondary | ICD-10-CM

## 2024-01-28 DIAGNOSIS — E66812 Obesity, class 2: Secondary | ICD-10-CM | POA: Diagnosis present

## 2024-01-28 DIAGNOSIS — Z794 Long term (current) use of insulin: Secondary | ICD-10-CM

## 2024-01-28 DIAGNOSIS — E781 Pure hyperglyceridemia: Secondary | ICD-10-CM | POA: Diagnosis present

## 2024-01-28 DIAGNOSIS — Z79899 Other long term (current) drug therapy: Secondary | ICD-10-CM

## 2024-01-28 DIAGNOSIS — K760 Fatty (change of) liver, not elsewhere classified: Secondary | ICD-10-CM | POA: Diagnosis present

## 2024-01-28 DIAGNOSIS — E1110 Type 2 diabetes mellitus with ketoacidosis without coma: Secondary | ICD-10-CM

## 2024-01-28 HISTORY — DX: Acute pancreatitis without necrosis or infection, unspecified: K85.90

## 2024-01-28 LAB — CBC WITH DIFFERENTIAL/PLATELET
Abs Immature Granulocytes: 0.11 K/uL — ABNORMAL HIGH (ref 0.00–0.07)
Basophils Absolute: 0 K/uL (ref 0.0–0.1)
Basophils Relative: 0 %
Eosinophils Absolute: 0 K/uL (ref 0.0–0.5)
Eosinophils Relative: 0 %
HCT: 50.8 % (ref 39.0–52.0)
Hemoglobin: 17.7 g/dL — ABNORMAL HIGH (ref 13.0–17.0)
Immature Granulocytes: 1 %
Lymphocytes Relative: 6 %
Lymphs Abs: 1.1 K/uL (ref 0.7–4.0)
MCH: 34.4 pg — ABNORMAL HIGH (ref 26.0–34.0)
MCHC: 34.8 g/dL (ref 30.0–36.0)
MCV: 98.8 fL (ref 80.0–100.0)
Monocytes Absolute: 1 K/uL (ref 0.1–1.0)
Monocytes Relative: 6 %
Neutro Abs: 15.6 K/uL — ABNORMAL HIGH (ref 1.7–7.7)
Neutrophils Relative %: 87 %
Platelets: 182 K/uL (ref 150–400)
RBC: 5.14 MIL/uL (ref 4.22–5.81)
RDW: 12.6 % (ref 11.5–15.5)
WBC: 17.8 K/uL — ABNORMAL HIGH (ref 4.0–10.5)
nRBC: 0 % (ref 0.0–0.2)

## 2024-01-28 LAB — GLUCOSE, CAPILLARY
Glucose-Capillary: 230 mg/dL — ABNORMAL HIGH (ref 70–99)
Glucose-Capillary: 195 mg/dL — ABNORMAL HIGH (ref 70–99)
Glucose-Capillary: 214 mg/dL — ABNORMAL HIGH (ref 70–99)
Glucose-Capillary: 183 mg/dL — ABNORMAL HIGH (ref 70–99)

## 2024-01-28 LAB — I-STAT CG4 LACTIC ACID, ED
Lactic Acid, Venous: 2.2 mmol/L (ref 0.5–1.9)
Lactic Acid, Venous: 2.1 mmol/L (ref 0.5–1.9)

## 2024-01-28 LAB — COMPREHENSIVE METABOLIC PANEL WITH GFR
ALT: 48 U/L — ABNORMAL HIGH (ref 0–44)
AST: 27 U/L (ref 15–41)
Albumin: 3.9 g/dL (ref 3.5–5.0)
Alkaline Phosphatase: 63 U/L (ref 38–126)
Anion gap: 13 (ref 5–15)
BUN: 13 mg/dL (ref 6–20)
CO2: 25 mmol/L (ref 22–32)
Calcium: 9.1 mg/dL (ref 8.9–10.3)
Chloride: 101 mmol/L (ref 98–111)
Creatinine, Ser: 0.73 mg/dL (ref 0.61–1.24)
GFR, Estimated: >60 mL/min
Glucose, Bld: 232 mg/dL — ABNORMAL HIGH (ref 70–99)
Potassium: 2.9 mmol/L — ABNORMAL LOW (ref 3.5–5.1)
Sodium: 139 mmol/L (ref 135–145)
Total Bilirubin: 1.2 mg/dL (ref 0.0–1.2)
Total Protein: 6.5 g/dL (ref 6.5–8.1)

## 2024-01-28 LAB — RESP PANEL BY RT-PCR (RSV, FLU A&B, COVID)  RVPGX2
Influenza A by PCR: NEGATIVE
Influenza B by PCR: NEGATIVE
Resp Syncytial Virus by PCR: NEGATIVE
SARS Coronavirus 2 by RT PCR: NEGATIVE

## 2024-01-28 LAB — LIPASE, BLOOD: Lipase: 505 U/L — ABNORMAL HIGH (ref 11–51)

## 2024-01-28 MED ORDER — VALSARTAN-HYDROCHLOROTHIAZIDE 160-25 MG PO TABS: 1.0000 | ORAL_TABLET | Freq: Every day | ORAL | Status: DC

## 2024-01-28 MED ORDER — INSULIN GLARGINE 100 UNIT/ML ~~LOC~~ SOLN
15.0000 [IU] | Freq: Every day | SUBCUTANEOUS | Status: DC
  Administered 2024-01-29 – 2024-01-30 (×2): 15 [IU] via SUBCUTANEOUS
  Filled 2024-01-28 (×2): qty 0.15

## 2024-01-28 MED ORDER — HYDRALAZINE HCL 20 MG/ML IJ SOLN
5.0000 mg | Freq: Four times a day (QID) | INTRAMUSCULAR | Status: DC | PRN
  Administered 2024-01-28 – 2024-01-29 (×2): 5 mg via INTRAVENOUS
  Filled 2024-01-28 (×2): qty 1

## 2024-01-28 MED ORDER — IRBESARTAN 150 MG PO TABS
150.0000 mg | ORAL_TABLET | Freq: Every day | ORAL | Status: DC
  Administered 2024-01-29 – 2024-01-30 (×2): 150 mg via ORAL
  Filled 2024-01-28 (×2): qty 1

## 2024-01-28 MED ORDER — ALUM & MAG HYDROXIDE-SIMETH 200-200-20 MG/5ML PO SUSP: 30.0000 mL | ORAL | Status: DC | PRN

## 2024-01-28 MED ORDER — MORPHINE SULFATE (PF) 4 MG/ML IV SOLN
4.0000 mg | Freq: Once | INTRAVENOUS | Status: AC
  Administered 2024-01-28: 4 mg via INTRAVENOUS
  Filled 2024-01-28: qty 1

## 2024-01-28 MED ORDER — HYDROMORPHONE HCL 1 MG/ML IJ SOLN
0.5000 mg | Freq: Once | INTRAMUSCULAR | Status: AC
  Administered 2024-01-28: 0.5 mg via INTRAVENOUS
  Filled 2024-01-28: qty 1

## 2024-01-28 MED ORDER — HYDROMORPHONE HCL 1 MG/ML IJ SOLN
1.0000 mg | Freq: Once | INTRAMUSCULAR | Status: AC
  Administered 2024-01-28: 1 mg via INTRAVENOUS
  Filled 2024-01-28: qty 1

## 2024-01-28 MED ORDER — APIXABAN 5 MG PO TABS
5.0000 mg | ORAL_TABLET | Freq: Two times a day (BID) | ORAL | Status: DC
  Administered 2024-01-28 – 2024-01-30 (×4): 5 mg via ORAL
  Filled 2024-01-28 (×4): qty 1

## 2024-01-28 MED ORDER — OXYCODONE HCL 5 MG PO TABS: 5.0000 mg | ORAL_TABLET | ORAL | Status: DC | PRN

## 2024-01-28 MED ORDER — LACTATED RINGERS IV BOLUS
1000.0000 mL | Freq: Once | INTRAVENOUS | Status: AC
  Administered 2024-01-28: 1000 mL via INTRAVENOUS

## 2024-01-28 MED ORDER — POTASSIUM CHLORIDE CRYS ER 20 MEQ PO TBCR
40.0000 meq | EXTENDED_RELEASE_TABLET | Freq: Once | ORAL | Status: AC
  Administered 2024-01-28: 40 meq via ORAL
  Filled 2024-01-28: qty 2

## 2024-01-28 MED ORDER — IOHEXOL 300 MG/ML  SOLN
100.0000 mL | Freq: Once | INTRAMUSCULAR | Status: AC | PRN
  Administered 2024-01-28: 100 mL via INTRAVENOUS

## 2024-01-28 MED ORDER — ONDANSETRON HCL 4 MG/2ML IJ SOLN: 4.0000 mg | Freq: Four times a day (QID) | INTRAMUSCULAR | Status: DC | PRN

## 2024-01-28 MED ORDER — ATORVASTATIN CALCIUM 40 MG PO TABS
40.0000 mg | ORAL_TABLET | Freq: Every day | ORAL | Status: DC
  Administered 2024-01-29: 40 mg via ORAL
  Filled 2024-01-28: qty 1

## 2024-01-28 MED ORDER — HYDROCHLOROTHIAZIDE 25 MG PO TABS
25.0000 mg | ORAL_TABLET | Freq: Every day | ORAL | Status: DC
  Administered 2024-01-29: 25 mg via ORAL
  Filled 2024-01-28: qty 1

## 2024-01-28 MED ORDER — ACETAMINOPHEN 325 MG PO TABS: 650.0000 mg | ORAL_TABLET | Freq: Four times a day (QID) | ORAL | Status: DC | PRN

## 2024-01-28 MED ORDER — LABETALOL HCL 5 MG/ML IV SOLN
10.0000 mg | INTRAVENOUS | Status: DC | PRN
  Administered 2024-01-28: 10 mg via INTRAVENOUS
  Filled 2024-01-28: qty 4

## 2024-01-28 MED ORDER — INSULIN ASPART 100 UNIT/ML IJ SOLN
0.0000 [IU] | INTRAMUSCULAR | Status: DC
  Administered 2024-01-28: 5 [IU] via SUBCUTANEOUS
  Administered 2024-01-28: 3 [IU] via SUBCUTANEOUS
  Administered 2024-01-28 – 2024-01-29 (×2): 5 [IU] via SUBCUTANEOUS
  Administered 2024-01-29: 3 [IU] via SUBCUTANEOUS
  Administered 2024-01-29: 8 [IU] via SUBCUTANEOUS
  Administered 2024-01-29 (×2): 3 [IU] via SUBCUTANEOUS
  Administered 2024-01-29: 5 [IU] via SUBCUTANEOUS
  Administered 2024-01-29 – 2024-01-30 (×2): 3 [IU] via SUBCUTANEOUS
  Administered 2024-01-30: 5 [IU] via SUBCUTANEOUS
  Filled 2024-01-28 (×4): qty 5
  Filled 2024-01-28 (×2): qty 3
  Filled 2024-01-28: qty 8
  Filled 2024-01-28 (×2): qty 3
  Filled 2024-01-28: qty 5
  Filled 2024-01-28: qty 3

## 2024-01-28 MED ORDER — MORPHINE SULFATE (PF) 2 MG/ML IV SOLN
2.0000 mg | Freq: Once | INTRAVENOUS | Status: AC | PRN
  Administered 2024-01-28: 2 mg via INTRAVENOUS
  Filled 2024-01-28: qty 1

## 2024-01-28 MED ORDER — BASAGLAR KWIKPEN 100 UNIT/ML ~~LOC~~ SOPN: 15.0000 [IU] | PEN_INJECTOR | Freq: Every day | SUBCUTANEOUS | Status: DC

## 2024-01-28 MED ORDER — ALBUTEROL SULFATE (2.5 MG/3ML) 0.083% IN NEBU: 2.5000 mg | INHALATION_SOLUTION | RESPIRATORY_TRACT | Status: DC | PRN

## 2024-01-28 MED ORDER — LACTATED RINGERS IV SOLN: INTRAVENOUS | Status: AC

## 2024-01-28 MED ORDER — HYDROMORPHONE HCL 1 MG/ML IJ SOLN
1.0000 mg | INTRAMUSCULAR | Status: DC | PRN
  Administered 2024-01-28 – 2024-01-30 (×11): 1 mg via INTRAVENOUS
  Filled 2024-01-28 (×11): qty 1

## 2024-01-28 MED ORDER — ACETAMINOPHEN 650 MG RE SUPP: 650.0000 mg | Freq: Four times a day (QID) | RECTAL | Status: DC | PRN

## 2024-01-28 MED ORDER — ONDANSETRON HCL 4 MG/2ML IJ SOLN
4.0000 mg | Freq: Once | INTRAMUSCULAR | Status: AC
  Administered 2024-01-28: 4 mg via INTRAVENOUS
  Filled 2024-01-28: qty 2

## 2024-01-28 MED ORDER — HYDROMORPHONE HCL 1 MG/ML IJ SOLN: 1.0000 mg | INTRAMUSCULAR | Status: DC | PRN

## 2024-01-28 NOTE — ED Notes (Signed)
 ED TO INPATIENT HANDOFF REPORT  Name/Age/Gender Riley Wiggins 58 y.o. male  Code Status Code Status History     Date Active Date Inactive Code Status Order ID Comments User Context   10/06/2021 1727 10/10/2021 1642 Full Code [id number]  Rockey Denece LABOR, DO Inpatient   03/12/2020 1728 03/15/2020 1900 Full Code [id number]  Rockey Denece LABOR, DO ED   08/03/2019 2246 08/06/2019 2119 Full Code [id number]  Dayna Motto, DO ED       Home/SNF/Other Home  Chief Complaint Acute pancreatitis [K85.90]  Level of Care/Admitting Diagnosis ED Disposition     ED Disposition  Admit   Condition  --   Comment  Hospital Area: [facility] [[id number]]  Level of Care: Med-Surg [16]  May place patient in observation at [facility] or [facility] if equivalent level of care is available:: Yes  Diagnosis: Acute pancreatitis [577.0.ICD-9-CM]  Admitting Physician: ZELLA KATHA HERO [[id number]]  Attending Physician: ZELLA, MIR HART.GULA [[id number]]          Medical History Past Medical History:  Diagnosis Date   Diabetes mellitus without complication (HCC)    Dyslipidemia    Hyperlipidemia    Hypertension    Thrombosis 03/15/2020   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     Allergies Allergies[1]  IV Location/Drains/Wounds Patient Lines/Drains/Airways Status     Active Line/Drains/Airways     Name Placement date Placement time Site Days   Peripheral IV 01/28/24 20 G Left Antecubital 01/28/24  0824  Antecubital  less than 1            Labs/Imaging Results for orders placed or performed during the hospital encounter of 01/28/24 (from the past 48 hours)  CBC with Differential     Status: Abnormal   Collection Time: 01/28/24  8:19 AM  Result Value Ref Range   WBC 17.8 (H) 4.0 - 10.5 K/uL   RBC 5.14 4.22 - 5.81 MIL/uL   Hemoglobin 17.7 (H) 13.0 - 17.0 g/dL   HCT 49.1 60.9 - 47.9 %   MCV 98.8 80.0 - 100.0 fL    MCH 34.4 (H) 26.0 - 34.0 pg   MCHC 34.8 30.0 - 36.0 g/dL   RDW 87.3 88.4 - 84.4 %   Platelets 182 150 - 400 K/uL   nRBC 0.0 0.0 - 0.2 %   Neutrophils Relative % 87 %   Neutro Abs 15.6 (H) 1.7 - 7.7 K/uL   Lymphocytes Relative 6 %   Lymphs Abs 1.1 0.7 - 4.0 K/uL   Monocytes Relative 6 %   Monocytes Absolute 1.0 0.1 - 1.0 K/uL   Eosinophils Relative 0 %   Eosinophils Absolute 0.0 0.0 - 0.5 K/uL   Basophils Relative 0 %   Basophils Absolute 0.0 0.0 - 0.1 K/uL   Immature Granulocytes 1 %   Abs Immature Granulocytes 0.11 (H) 0.00 - 0.07 K/uL    Comment: Performed at [facility], [address] [place], [place] [address]  Comprehensive metabolic panel     Status: Abnormal   Collection Time: 01/28/24  8:19 AM  Result Value Ref Range   Sodium 139 135 - 145 mmol/L   Potassium 2.9 (L) 3.5 - 5.1 mmol/L   Chloride 101 98 - 111 mmol/L   CO2 25 22 - 32 mmol/L   Glucose, Bld 232 (H) 70 - 99 mg/dL    Comment: Glucose reference range applies only to samples taken after fasting  for at least 8 hours.   BUN 13 6 - 20 mg/dL   Creatinine, Ser 9.26 0.61 - 1.24 mg/dL   Calcium  9.1 8.9 - 10.3 mg/dL   Total Protein 6.5 6.5 - 8.1 g/dL   Albumin 3.9 3.5 - 5.0 g/dL   AST 27 15 - 41 U/L   ALT 48 (H) 0 - 44 U/L   Alkaline Phosphatase 63 38 - 126 U/L   Total Bilirubin 1.2 0.0 - 1.2 mg/dL   GFR, Estimated >39 >39 mL/min    Comment: (NOTE) Calculated using the CKD-EPI Creatinine Equation (2021)    Anion gap 13 5 - 15    Comment: Performed at [facility], [address], [place], [place] [address]  Lipase, blood     Status: Abnormal   Collection Time: 01/28/24  8:19 AM  Result Value Ref Range   Lipase 505 (H) 11 - 51 U/L    Comment: Performed at [facility], [address], [place], [place] [address]  Resp panel by RT-PCR (RSV, Flu A&B, Covid) Anterior Nasal Swab     Status: None   Collection Time: 01/28/24  8:19 AM   Specimen: Anterior  Nasal Swab  Result Value Ref Range   SARS Coronavirus 2 by RT PCR NEGATIVE NEGATIVE    Comment: (NOTE) SARS-CoV-2 target nucleic acids are NOT DETECTED.  The SARS-CoV-2 RNA is generally detectable in upper respiratory specimens during the acute phase of infection. The lowest concentration of SARS-CoV-2 viral copies this assay can detect is 138 copies/mL. A negative result does not preclude SARS-Cov-2 infection and should not be used as the sole basis for treatment or other patient management decisions. A negative result may occur with  improper specimen collection/handling, submission of specimen other than nasopharyngeal swab, presence of viral mutation(s) within the areas targeted by this assay, and inadequate number of viral copies(<138 copies/mL). A negative result must be combined with clinical observations, patient history, and epidemiological information. The expected result is Negative.  Fact Sheet for Patients:  bloggercourse.com  Fact Sheet for Healthcare Providers:  seriousbroker.it  This test is no t yet approved or cleared by the [place]  FDA and  has been authorized for detection and/or diagnosis of SARS-CoV-2 by FDA under an Emergency Use Authorization (EUA). This EUA will remain  in effect (meaning this test can be used) for the duration of the COVID-19 declaration under Section 564(b)(1) of the Act, 21 U.S.C.section 360bbb-3(b)(1), unless the authorization is terminated  or revoked sooner.       Influenza A by PCR NEGATIVE NEGATIVE   Influenza B by PCR NEGATIVE NEGATIVE    Comment: (NOTE) The Xpert Xpress SARS-CoV-2/FLU/RSV plus assay is intended as an aid in the diagnosis of influenza from Nasopharyngeal swab specimens and should not be used as a sole basis for treatment. Nasal washings and aspirates are unacceptable for Xpert Xpress SARS-CoV-2/FLU/RSV testing.  Fact Sheet for  Patients: bloggercourse.com  Fact Sheet for Healthcare Providers: seriousbroker.it  This test is not yet approved or cleared by the [place]  FDA and has been authorized for detection and/or diagnosis of SARS-CoV-2 by FDA under an Emergency Use Authorization (EUA). This EUA will remain in effect (meaning this test can be used) for the duration of the COVID-19 declaration under Section 564(b)(1) of the Act, 21 U.S.C. section 360bbb-3(b)(1), unless the authorization is terminated or revoked.     Resp Syncytial Virus by PCR NEGATIVE NEGATIVE    Comment: (NOTE) Fact Sheet for Patients:  bloggercourse.com  Fact Sheet for Healthcare Providers: seriousbroker.it  This test is not yet approved or cleared by the [place]  FDA and has been authorized for detection and/or diagnosis of SARS-CoV-2 by FDA under an Emergency Use Authorization (EUA). This EUA will remain in effect (meaning this test can be used) for the duration of the COVID-19 declaration under Section 564(b)(1) of the Act, 21 U.S.C. section 360bbb-3(b)(1), unless the authorization is terminated or revoked.  Performed at [facility], [address] [place], [place] [address]   I-Stat CG4 Lactic Acid     Status: Abnormal   Collection Time: 01/28/24  8:35 AM  Result Value Ref Range   Lactic Acid, Venous 2.2 (HH) 0.5 - 1.9 mmol/L   Comment NOTIFIED PHYSICIAN   I-Stat CG4 Lactic Acid     Status: Abnormal   Collection Time: 01/28/24 10:59 AM  Result Value Ref Range   Lactic Acid, Venous 2.1 (HH) 0.5 - 1.9 mmol/L   Comment NOTIFIED PHYSICIAN    US  Abdomen Limited RUQ (LIVER/GB) Result Date: 01/28/2024 EXAM: Right Upper Quadrant Abdominal Ultrasound 01/28/2024 10:06:00 AM TECHNIQUE: Real-time ultrasonography of the right upper quadrant of the abdomen was performed. COMPARISON: US  Abdomen 10/06/2021;  03/12/2020. CLINICAL HISTORY: RUQ pain. FINDINGS: LIVER: Mild hepatic steatosis. No intrahepatic biliary ductal dilatation. No evidence of mass. Hepatopetal flow in the portal vein. BILIARY SYSTEM: Gallbladder wall thickness measures 1.0 mm. No pericholecystic fluid. No cholelithiasis. No sonographic Murphy sign. The common bile duct measures 2.3 mm. OTHER: No right upper quadrant ascites. IMPRESSION: 1. No acute cholecystolithiasis or changes of acute cholecystitis. Electronically signed by: Rogelia Myers MD 01/28/2024 11:21 AM EST RP Workstation: [id number]   CT ABDOMEN PELVIS W CONTRAST Result Date: 01/28/2024 EXAM: CT ABDOMEN AND PELVIS WITH CONTRAST 01/28/2024 10:33:24 AM TECHNIQUE: CT of the abdomen and pelvis was performed with the administration of 100 mL of iohexol  (OMNIPAQUE ) 300 MG/ML solution. Multiplanar reformatted images are provided for review. Automated exposure control, iterative reconstruction, and/or weight-based adjustment of the mA/kV was utilized to reduce the radiation dose to as low as reasonably achievable. COMPARISON: 10/06/2021 CLINICAL HISTORY: Pancreatitis, acute, severe. FINDINGS: LOWER CHEST: Posterior bibasilar dependent atelectasis. Right coronary artery atherosclerosis. Small volume symmetric bilateral gynecomastia. LIVER: Diffuse hepatic steatosis. GALLBLADDER AND BILE DUCTS: Small amount of inflammatory stranding about the gallbladder neck is also likely related to pancreatitis. No gallbladder wall thickening or radiopaque stones. No biliary ductal dilatation. SPLEEN: No acute abnormality. PANCREAS: Acute peripancreatic edema and fluid surrounding the entire pancreatic parenchyma, extending into the root of the mesentery, the left anterior pararenal space, the gastrosplenic ligament, and the left paracolic gutter. The fluid around the pancreatic head extends into the pancreaticoduodenal groove, the right anterior pararenal space, and the right paracolic gutter. Small region  of hypoattenuation of the pancreatic head measuring 1.4 cm axial is indeterminate, but likely interdigitating edema. Close follow up is recommended as early pancreatic necrosis could have this appearance. No well formed or drainable peripancreatic fluid collection. ADRENAL GLANDS: No acute abnormality. KIDNEYS, URETERS AND BLADDER: No stones in the kidneys or ureters. No hydronephrosis. No perinephric or periureteral stranding. Urinary bladder is unremarkable. GI AND BOWEL: Stomach demonstrates no acute abnormality. There is no bowel obstruction. Total colonic diverticulosis. No changes of acute diverticulitis. Gas filled noninflamed appendix. PERITONEUM AND RETROPERITONEUM: No ascites. No free air. VASCULATURE: Aorta is normal in caliber. Diffuse aortoiliac atherosclerosis. LYMPH NODES: No lymphadenopathy. REPRODUCTIVE ORGANS: No prostatomegaly. BONES AND SOFT TISSUES: Multilevel degenerative disc disease of the thoracolumbar  spine. Mild bilateral hip osteoarthritis with subchondral cystic change. No acute osseous abnormality. No focal soft tissue abnormality. IMPRESSION: 1. Findings consistent with acute interstitial edematous pancreatitis. Indeterminate 1.4 cm region of hypoattenuation in the pancreatic head, which may represent interdigitating edema. Alternatively, early pancreatic necrosis could have this appearance and close cross-sectional imaging follow-up is recommended.No well-formed or drainable peripancreatic fluid collection. 2. Total colonic diverticulosis. 3. Diffuse hepatic steatosis. Electronically signed by: Rogelia Myers MD 01/28/2024 11:18 AM EST RP Workstation: [id number]   DG Chest Portable 1 View Result Date: 01/28/2024 CLINICAL DATA:  Cough. EXAM: PORTABLE CHEST 1 VIEW COMPARISON:  None Available. FINDINGS: The heart size and mediastinal contours are within normal limits. Both lungs are clear. The visualized skeletal structures are unremarkable. IMPRESSION: No active disease.  Electronically Signed   By: Norleen DELENA Kil M.D.   On: 01/28/2024 10:21    Pending Labs Unresulted Labs (From admission, onward)    None       Vitals/Pain Today's Vitals   01/28/24 1012 01/28/24 1018 01/28/24 1144 01/28/24 1158  BP: (!) 173/89   (!) 176/92  Pulse: (!) 105   (!) 107  Resp: 18   18  Temp: 97.9 F (36.6 C)     SpO2: 96%   95%  PainSc:  Asleep 8  8     Isolation Precautions No active isolations  Medications Medications  lactated ringers  infusion (has no administration in time range)  ondansetron  (ZOFRAN ) injection 4 mg (has no administration in time range)  HYDROmorphone  (DILAUDID ) injection 1 mg (has no administration in time range)  ondansetron  (ZOFRAN ) injection 4 mg (4 mg Intravenous Given 01/28/24 0824)  lactated ringers  bolus 1,000 mL (0 mLs Intravenous Stopped 01/28/24 1132)  morphine  (PF) 2 MG/ML injection 2 mg (2 mg Intravenous Given 01/28/24 0824)  morphine  (PF) 4 MG/ML injection 4 mg (4 mg Intravenous Given 01/28/24 0921)  iohexol  (OMNIPAQUE ) 300 MG/ML solution 100 mL (100 mLs Intravenous Contrast Given 01/28/24 1016)  potassium chloride  SA (KLOR-CON  M) CR tablet 40 mEq (40 mEq Oral Given 01/28/24 1051)  lactated ringers  bolus 1,000 mL (1,000 mLs Intravenous New Bag/Given 01/28/24 1144)  HYDROmorphone  (DILAUDID ) injection 0.5 mg (0.5 mg Intravenous Given 01/28/24 1201)    Mobility walks    [1] No Known Allergies

## 2024-01-28 NOTE — ED Provider Notes (Signed)
 " [facility] AT [facility] Provider Note   CSN: [id number] Arrival date & time: 01/28/24  9246     Patient presents with: Abdominal Pain   Riley Wiggins is a 58 y.o. male.   This is a 58 year old male presenting emergency department with epigastric abdominal pain that started yesterday morning.  Has had pancreatitis in the past.  Feels similar.  Associated with nausea and vomiting.  Nonbloody.  Normal bowel movement yesterday.  Also has had minor cough and some chills.  He is concerned for possible flu.  No prior abdominal surgeries.   Abdominal Pain      Prior to Admission medications  Medication Sig Start Date End Date Taking? Authorizing Provider  allopurinol  (ZYLOPRIM ) 100 MG tablet Take 1 tablet (100 mg total) by mouth daily. 11/29/23  Yes Theotis Haze ORN, NP  apixaban  (ELIQUIS ) 5 MG TABS tablet Take 1 tablet (5 mg total) by mouth 2 (two) times daily. Patient taking differently: Take 5 mg by mouth daily at 6 (six) AM. 12/23/23  Yes Fleming, Zelda W, NP  atorvastatin  (LIPITOR) 40 MG tablet Take 1 tablet (40 mg total) by mouth daily. 08/21/23 02/21/24 Yes Patel, Vaishali K, RPH  dapagliflozin  propanediol (FARXIGA ) 10 MG TABS tablet Take 1 tablet (10 mg total) by mouth daily. 12/12/23  Yes Newlin, Enobong, MD  Dulaglutide  (TRULICITY ) 0.75 MG/0.5ML SOAJ Inject 0.75 mg into the skin once a week. Patient taking differently: Inject 0.75 mg into the skin every Saturday. 11/29/23  Yes Fleming, Zelda W, NP  Evolocumab  (REPATHA  SURECLICK) 140 MG/ML SOAJ Inject 140 mg into the skin every 14 (fourteen) days. 05/23/23  Yes Chandrasekhar, Mahesh A, MD  Insulin  Glargine (BASAGLAR  KWIKPEN) 100 UNIT/ML Inject 30 Units into the skin daily. Patient taking differently: Inject 30 Units into the skin in the morning. 11/29/23  Yes Theotis Haze ORN, NP  Multiple Vitamins-Minerals (MULTI FOR HIM 50+) TABS Take 1 tablet by mouth daily with breakfast.   Yes [provider]  Omega-3 Fatty Acids (OMEGA 3 FISH OIL PO) Take 1 capsule by mouth daily with breakfast.   Yes [provider]  sildenafil  (VIAGRA ) 100 MG tablet Take 0.5-1 tablets (50-100 mg total) by mouth daily as needed for erectile dysfunction. 09/10/22  Yes Fleming, Zelda W, NP  valsartan -hydrochlorothiazide  (DIOVAN -HCT) 160-25 MG tablet Take 1 tablet by mouth daily. 11/29/23  Yes Theotis Haze ORN, NP  Vitamin D , Ergocalciferol , (DRISDOL ) 1.25 MG (50000 UNIT) CAPS capsule Take 1 capsule (50,000 Units total) by mouth every 7 (seven) days. Patient taking differently: Take 50,000 Units by mouth every Saturday. 07/22/23  Yes Theotis Haze ORN, NP  Accu-Chek Softclix Lancets lancets Use to check blood sugar 3 times daily. 05/02/23   Newlin, Enobong, MD  Blood Glucose Monitoring Suppl (ACCU-CHEK GUIDE) w/Device KIT Use to check blood sugar 3 times daily. 05/02/23   Newlin, Enobong, MD  Continuous Glucose Sensor (FREESTYLE LIBRE 3 PLUS SENSOR) MISC Change sensor every 15 days. Patient not taking: Reported on 01/28/2024 11/29/23   Theotis Haze ORN, NP  Continuous Glucose Sensor (FREESTYLE LIBRE 3 SENSOR) MISC Place 1 sensor on the skin every 14 days. Use to check glucose continuously. Patient not taking: Reported on 01/28/2024 07/10/22   Newlin, Enobong, MD  glucose blood (ACCU-CHEK GUIDE TEST) test strip Use to check blood sugar 3 times daily. 05/02/23   Newlin, Enobong, MD  Insulin  Pen Needle (TECHLITE PEN NEEDLES) 31G X 5 MM MISC Use as instructed to inject insulin  10/21/23  Newlin, Enobong, MD    Allergies: Patient has no known allergies.    Review of Systems  Gastrointestinal:  Positive for abdominal pain.    Updated Vital Signs BP (!) 176/92 (BP Location: Left Arm)   Pulse (!) 107   Temp 97.9 F (36.6 C)   Resp 18   Ht 6' 1 (1.854 m)   Wt 129.3 kg   SpO2 95%   BMI 37.60 kg/m   Physical Exam Vitals and nursing note reviewed.  Constitutional:      General: He is not in acute  distress.    Appearance: He is obese. He is not toxic-appearing.  HENT:     Head: Normocephalic.  Cardiovascular:     Rate and Rhythm: Normal rate and regular rhythm.  Pulmonary:     Effort: Pulmonary effort is normal.  Abdominal:     General: Abdomen is flat.     Palpations: Abdomen is soft.     Tenderness: There is abdominal tenderness in the right upper quadrant and epigastric area.  Skin:    General: Skin is warm and dry.     Capillary Refill: Capillary refill takes less than 2 seconds.  Neurological:     General: No focal deficit present.     Mental Status: He is alert.  Psychiatric:        Mood and Affect: Mood normal.        Behavior: Behavior normal.     (all labs ordered are listed, but only abnormal results are displayed) Labs Reviewed  CBC WITH DIFFERENTIAL/PLATELET - Abnormal; Notable for the following components:      Result Value   WBC 17.8 (*)    Hemoglobin 17.7 (*)    MCH 34.4 (*)    Neutro Abs 15.6 (*)    Abs Immature Granulocytes 0.11 (*)    All other components within normal limits  COMPREHENSIVE METABOLIC PANEL WITH GFR - Abnormal; Notable for the following components:   Potassium 2.9 (*)    Glucose, Bld 232 (*)    ALT 48 (*)    All other components within normal limits  LIPASE, BLOOD - Abnormal; Notable for the following components:   Lipase 505 (*)    All other components within normal limits  GLUCOSE, CAPILLARY - Abnormal; Notable for the following components:   Glucose-Capillary 230 (*)    All other components within normal limits  I-STAT CG4 LACTIC ACID, ED - Abnormal; Notable for the following components:   Lactic Acid, Venous 2.2 (*)    All other components within normal limits  I-STAT CG4 LACTIC ACID, ED - Abnormal; Notable for the following components:   Lactic Acid, Venous 2.1 (*)    All other components within normal limits  RESP PANEL BY RT-PCR (RSV, FLU A&B, COVID)  [id number]    EKG: None  Radiology: US  Abdomen Limited RUQ  (LIVER/GB) Result Date: 01/28/2024 EXAM: Right Upper Quadrant Abdominal Ultrasound 01/28/2024 10:06:00 AM TECHNIQUE: Real-time ultrasonography of the right upper quadrant of the abdomen was performed. COMPARISON: US  Abdomen 10/06/2021; 03/12/2020. CLINICAL HISTORY: RUQ pain. FINDINGS: LIVER: Mild hepatic steatosis. No intrahepatic biliary ductal dilatation. No evidence of mass. Hepatopetal flow in the portal vein. BILIARY SYSTEM: Gallbladder wall thickness measures 1.0 mm. No pericholecystic fluid. No cholelithiasis. No sonographic Murphy sign. The common bile duct measures 2.3 mm. OTHER: No right upper quadrant ascites. IMPRESSION: 1. No acute cholecystolithiasis or changes of acute cholecystitis. Electronically signed by: Rogelia Myers MD 01/28/2024 11:21 AM EST RP Workstation: [id number]  CT ABDOMEN PELVIS W CONTRAST Result Date: 01/28/2024 EXAM: CT ABDOMEN AND PELVIS WITH CONTRAST 01/28/2024 10:33:24 AM TECHNIQUE: CT of the abdomen and pelvis was performed with the administration of 100 mL of iohexol  (OMNIPAQUE ) 300 MG/ML solution. Multiplanar reformatted images are provided for review. Automated exposure control, iterative reconstruction, and/or weight-based adjustment of the mA/kV was utilized to reduce the radiation dose to as low as reasonably achievable. COMPARISON: 10/06/2021 CLINICAL HISTORY: Pancreatitis, acute, severe. FINDINGS: LOWER CHEST: Posterior bibasilar dependent atelectasis. Right coronary artery atherosclerosis. Small volume symmetric bilateral gynecomastia. LIVER: Diffuse hepatic steatosis. GALLBLADDER AND BILE DUCTS: Small amount of inflammatory stranding about the gallbladder neck is also likely related to pancreatitis. No gallbladder wall thickening or radiopaque stones. No biliary ductal dilatation. SPLEEN: No acute abnormality. PANCREAS: Acute peripancreatic edema and fluid surrounding the entire pancreatic parenchyma, extending into the root of the mesentery, the left anterior  pararenal space, the gastrosplenic ligament, and the left paracolic gutter. The fluid around the pancreatic head extends into the pancreaticoduodenal groove, the right anterior pararenal space, and the right paracolic gutter. Small region of hypoattenuation of the pancreatic head measuring 1.4 cm axial is indeterminate, but likely interdigitating edema. Close follow up is recommended as early pancreatic necrosis could have this appearance. No well formed or drainable peripancreatic fluid collection. ADRENAL GLANDS: No acute abnormality. KIDNEYS, URETERS AND BLADDER: No stones in the kidneys or ureters. No hydronephrosis. No perinephric or periureteral stranding. Urinary bladder is unremarkable. GI AND BOWEL: Stomach demonstrates no acute abnormality. There is no bowel obstruction. Total colonic diverticulosis. No changes of acute diverticulitis. Gas filled noninflamed appendix. PERITONEUM AND RETROPERITONEUM: No ascites. No free air. VASCULATURE: Aorta is normal in caliber. Diffuse aortoiliac atherosclerosis. LYMPH NODES: No lymphadenopathy. REPRODUCTIVE ORGANS: No prostatomegaly. BONES AND SOFT TISSUES: Multilevel degenerative disc disease of the thoracolumbar spine. Mild bilateral hip osteoarthritis with subchondral cystic change. No acute osseous abnormality. No focal soft tissue abnormality. IMPRESSION: 1. Findings consistent with acute interstitial edematous pancreatitis. Indeterminate 1.4 cm region of hypoattenuation in the pancreatic head, which may represent interdigitating edema. Alternatively, early pancreatic necrosis could have this appearance and close cross-sectional imaging follow-up is recommended.No well-formed or drainable peripancreatic fluid collection. 2. Total colonic diverticulosis. 3. Diffuse hepatic steatosis. Electronically signed by: Rogelia Myers MD 01/28/2024 11:18 AM EST RP Workstation: [id number]   DG Chest Portable 1 View Result Date: 01/28/2024 CLINICAL DATA:  Cough. EXAM:  PORTABLE CHEST 1 VIEW COMPARISON:  None Available. FINDINGS: The heart size and mediastinal contours are within normal limits. Both lungs are clear. The visualized skeletal structures are unremarkable. IMPRESSION: No active disease. Electronically Signed   By: Norleen DELENA Kil M.D.   On: 01/28/2024 10:21     Procedures   Medications Ordered in the ED  lactated ringers  infusion ( Intravenous Restarted 01/28/24 1331)  ondansetron  (ZOFRAN ) injection 4 mg (has no administration in time range)  acetaminophen  (TYLENOL ) tablet 650 mg (has no administration in time range)    Or  acetaminophen  (TYLENOL ) suppository 650 mg (has no administration in time range)  albuterol  (PROVENTIL ) (2.5 MG/3ML) 0.083% nebulizer solution 2.5 mg (has no administration in time range)  atorvastatin  (LIPITOR) tablet 40 mg (40 mg Oral Patient Refused/Not Given 01/28/24 1344)  apixaban  (ELIQUIS ) tablet 5 mg (5 mg Oral Patient Refused/Not Given 01/28/24 1344)  oxyCODONE  (Oxy IR/ROXICODONE ) immediate release tablet 5 mg (has no administration in time range)  insulin  aspart (novoLOG ) injection 0-15 Units (5 Units Subcutaneous Given 01/28/24 1410)  hydrALAZINE  (APRESOLINE ) injection 5 mg (has no administration  in time range)  HYDROmorphone  (DILAUDID ) injection 1 mg (has no administration in time range)  irbesartan  (AVAPRO ) tablet 150 mg (has no administration in time range)    And  hydrochlorothiazide  (HYDRODIURIL ) tablet 25 mg (has no administration in time range)  insulin  glargine (LANTUS ) injection 15 Units (has no administration in time range)  ondansetron  (ZOFRAN ) injection 4 mg (4 mg Intravenous Given 01/28/24 0824)  lactated ringers  bolus 1,000 mL (0 mLs Intravenous Stopped 01/28/24 1132)  morphine  (PF) 2 MG/ML injection 2 mg (2 mg Intravenous Given 01/28/24 0824)  morphine  (PF) 4 MG/ML injection 4 mg (4 mg Intravenous Given 01/28/24 0921)  iohexol  (OMNIPAQUE ) 300 MG/ML solution 100 mL (100 mLs Intravenous Contrast Given  01/28/24 1016)  potassium chloride  SA (KLOR-CON  M) CR tablet 40 mEq (40 mEq Oral Given 01/28/24 1051)  lactated ringers  bolus 1,000 mL (0 mLs Intravenous Stopping previously hung infusion 01/28/24 1326)  HYDROmorphone  (DILAUDID ) injection 0.5 mg (0.5 mg Intravenous Given 01/28/24 1201)  HYDROmorphone  (DILAUDID ) injection 1 mg (1 mg Intravenous Given 01/28/24 1409)    Clinical Course as of 01/28/24 1507  Tue Jan 28, 2024  1002 Lipase(!): 505 Concerning for pancreatitis. [TY]  1002 Potassium(!): 2.9 Will replete [TY]  1002 WBC(!): 17.8 Reactive secondary to pancreatitis? [TY]  1002 US  Abdomen Limited RUQ (LIVER/GB) Does not appear to be have wall thickening or common bile duct dilation on my independent review of images.  Does appear to have stone versus sludge [TY]  1129 CT ABDOMEN PELVIS W CONTRAST IMPRESSION: 1. Findings consistent with acute interstitial edematous pancreatitis. Indeterminate 1.4 cm region of hypoattenuation in the pancreatic head, which may represent interdigitating edema. Alternatively, early pancreatic necrosis could have this appearance and close cross-sectional imaging follow-up is recommended.No well-formed or drainable peripancreatic fluid collection. 2. Total colonic diverticulosis. 3. Diffuse hepatic steatosis.  Electronically signed by: Rogelia Myers MD 01/28/2024 11:18 AM EST RP Workstation: [id number]   [TY]    Clinical Course User Index [TY] Neysa Caron PARAS, DO                                 Medical Decision Making This is a 58 year old male presenting emergency department with epigastric abdominal pain.  Complicated past medical history to include obesity, diabetes, hypertension hyperlipidemia, history of SMA thrombus, and pancreatitis.  Per chart review was admitted in 2023 for acute uncomplicated pancreatitis.  He is clinically well-appearing.  EMS reported giving 100 mcg of fentanyl  prior to arrival, otherwise stable vital signs.  Does have  some tenderness in his epigastrium/right upper quadrant, but mild.  Will get screening labs and CT scan.  IV fluids and nausea medications ordered.  Do have as needed morphine  ordered.  See ED course for further MDM and final disposition.  Amount and/or Complexity of Data Reviewed Independent Historian:     Details: Wife at bedside notes he gets pancreatitis flares every 2 or 3 years. External Data Reviewed:     Details: See ED course Labs: ordered. Decision-making details documented in ED Course.    Details: See ED course Radiology: ordered and independent interpretation performed. Decision-making details documented in ED Course.    Details: Do not appreciate free air on CT scan Discussion of management or test interpretation with external provider(s): Discussed with hospitalist agrees to admit patient.  Risk Prescription drug management. Parenteral controlled substances. Decision regarding hospitalization. Diagnosis or treatment significantly limited by social determinants of health. Risk Details: Poor health  literacy       Final diagnoses:  Acute pancreatitis, unspecified complication status, unspecified pancreatitis type    ED Discharge Orders     None          Neysa Caron PARAS, DO 01/28/24 1507  "

## 2024-01-28 NOTE — H&P (Signed)
 " History and Physical  Riley Wiggins FMW:[id number] DOB: Aug 06, 1965 DOA: 01/28/2024  PCP: Theotis Haze ORN, NP   Chief Complaint: Abdominal pain  HPI: Riley Wiggins is a 58 y.o. male with medical history significant for insulin -dependent type 2 diabetes, dyslipidemia, hypertension, prior idiopathic pancreatitis be admitted to the hospital with acute pancreatitis.  Patient states that starting a couple of days ago, he started having epigastric abdominal pain, without radiation.  There was associated nausea without vomiting, and diarrhea.  There was also some abdominal distention.  The symptoms are all reminiscent of when he had pancreatitis back in 2023.  Denies any fevers, chills, chest pain, hematemesis or blood in his stool.  Review of Systems: Please see HPI for pertinent positives and negatives. A complete 10 system review of systems are otherwise negative.  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    Past Surgical History:  Procedure Laterality Date   HERNIA REPAIR      when I was young    Social History:  reports that he quit smoking about 16 years ago. His smoking use included cigarettes. He started smoking about 36 years ago. He has never used smokeless tobacco. He reports current alcohol use. He reports that he does not use drugs.  Allergies[1]  Family History  Problem Relation Age of Onset   Diabetes Mother    Diabetes Father    Pancreatitis Neg Hx    Colon cancer Neg Hx    Stomach cancer Neg Hx      Prior to Admission medications  Medication Sig Start Date End Date Taking? Authorizing Provider  Accu-Chek Softclix Lancets lancets Use to check blood sugar 3 times daily. 05/02/23   Newlin, Enobong, MD  allopurinol  (ZYLOPRIM ) 100 MG tablet  Take 1 tablet (100 mg total) by mouth daily. 11/29/23   Fleming, Zelda W, NP  apixaban  (ELIQUIS ) 5 MG TABS tablet Take 1 tablet (5 mg total) by mouth 2 (two) times daily. 12/23/23   Fleming, Zelda W, NP  atorvastatin  (LIPITOR) 40 MG tablet Take 1 tablet (40 mg total) by mouth daily. 08/21/23 02/21/24  Patel, Vaishali K, RPH  Blood Glucose Monitoring Suppl (ACCU-CHEK GUIDE) w/Device KIT Use to check blood sugar 3 times daily. 05/02/23   Newlin, Enobong, MD  Continuous Glucose Sensor (FREESTYLE LIBRE 3 PLUS SENSOR) MISC Change sensor every 15 days. 11/29/23   Theotis Haze ORN, NP  Continuous Glucose Sensor (FREESTYLE LIBRE 3 SENSOR) MISC Place 1 sensor on the skin every 14 days. Use to check glucose continuously. Patient not taking: Reported on 11/29/2023 07/10/22   Newlin, Enobong, MD  dapagliflozin  propanediol (FARXIGA ) 10 MG TABS tablet Take 1 tablet (10 mg total) by mouth daily. 12/12/23   Newlin, Enobong, MD  Dulaglutide  (TRULICITY ) 0.75 MG/0.5ML SOAJ Inject 0.75 mg into the skin once a week. 11/29/23   Fleming, Zelda W, NP  Evolocumab  (REPATHA  SURECLICK) 140 MG/ML SOAJ Inject 140 mg into the skin every 14 (fourteen) days. 05/23/23   Santo Stanly LABOR, MD  Garlic 1200 MG CAPS Take 1,200 mg by mouth daily.    [provider]  glucose blood (ACCU-CHEK GUIDE TEST) test strip Use to check blood sugar 3 times daily. 05/02/23   Newlin, Enobong, MD  Insulin  Glargine (BASAGLAR  KWIKPEN) 100 UNIT/ML  Inject 30 Units into the skin daily. 11/29/23   Fleming, Zelda W, NP  Insulin  Pen Needle (TECHLITE PEN NEEDLES) 31G X 5 MM MISC Use as instructed to inject insulin  10/21/23   Delbert Clam, MD  Multiple Vitamins-Minerals (MULTI FOR HIM 50+) TABS Take 1 tablet by mouth daily.    [provider]  sildenafil  (VIAGRA ) 100 MG tablet Take 0.5-1 tablets (50-100 mg total) by mouth daily as needed for erectile dysfunction. Patient not taking: Reported on 11/29/2023 09/10/22   Fleming, Zelda W, NP   valsartan -hydrochlorothiazide  (DIOVAN -HCT) 160-25 MG tablet Take 1 tablet by mouth daily. 11/29/23   Fleming, Zelda W, NP  Vitamin D , Ergocalciferol , (DRISDOL ) 1.25 MG (50000 UNIT) CAPS capsule Take 1 capsule (50,000 Units total) by mouth every 7 (seven) days. 07/22/23   Theotis Haze ORN, NP    Physical Exam: BP (!) 176/92 (BP Location: Left Arm)   Pulse (!) 107   Temp 97.9 F (36.6 C)   Resp 18   SpO2 95%  General:  Alert, oriented, calm, in no acute distress  Eyes: EOMI, clear conjuctivae, white sclerea Neck: supple, no masses, trachea mildline  Cardiovascular: RRR, no murmurs or rubs, no peripheral edema  Respiratory: clear to auscultation bilaterally, no wheezes, no crackles  Abdomen: soft, diffusely tender with voluntary guarding, distended, normal bowel tones heard  Skin: dry, no rashes  Musculoskeletal: no joint effusions, normal range of motion  Psychiatric: appropriate affect, normal speech  Neurologic: extraocular muscles intact, clear speech, moving all extremities with intact sensorium         Labs on Admission:  Basic Metabolic Panel: Recent Labs  Lab 01/28/24 0819  NA 139  K 2.9*  CL 101  CO2 25  GLUCOSE 232*  BUN 13  CREATININE 0.73  CALCIUM  9.1   Liver Function Tests: Recent Labs  Lab 01/28/24 0819  AST 27  ALT 48*  ALKPHOS 63  BILITOT 1.2  PROT 6.5  ALBUMIN 3.9   Recent Labs  Lab 01/28/24 0819  LIPASE 505*   No results for input(s): AMMONIA in the last 168 hours. CBC: Recent Labs  Lab 01/28/24 0819  WBC 17.8*  NEUTROABS 15.6*  HGB 17.7*  HCT 50.8  MCV 98.8  PLT 182   Cardiac Enzymes: No results for input(s): CKTOTAL, CKMB, CKMBINDEX, TROPONINI in the last 168 hours. BNP (last 3 results) No results for input(s): BNP in the last 8760 hours.  ProBNP (last 3 results) No results for input(s): PROBNP in the last 8760 hours.  CBG: No results for input(s): GLUCAP in the last 168 hours.  Radiological Exams on  Admission: US  Abdomen Limited RUQ (LIVER/GB) Result Date: 01/28/2024 EXAM: Right Upper Quadrant Abdominal Ultrasound 01/28/2024 10:06:00 AM TECHNIQUE: Real-time ultrasonography of the right upper quadrant of the abdomen was performed. COMPARISON: US  Abdomen 10/06/2021; 03/12/2020. CLINICAL HISTORY: RUQ pain. FINDINGS: LIVER: Mild hepatic steatosis. No intrahepatic biliary ductal dilatation. No evidence of mass. Hepatopetal flow in the portal vein. BILIARY SYSTEM: Gallbladder wall thickness measures 1.0 mm. No pericholecystic fluid. No cholelithiasis. No sonographic Murphy sign. The common bile duct measures 2.3 mm. OTHER: No right upper quadrant ascites. IMPRESSION: 1. No acute cholecystolithiasis or changes of acute cholecystitis. Electronically signed by: Rogelia Myers MD 01/28/2024 11:21 AM EST RP Workstation: [id number]   CT ABDOMEN PELVIS W CONTRAST Result Date: 01/28/2024 EXAM: CT ABDOMEN AND PELVIS WITH CONTRAST 01/28/2024 10:33:24 AM TECHNIQUE: CT of the abdomen and pelvis was performed with the administration of 100 mL of iohexol  (OMNIPAQUE ) 300 MG/ML  solution. Multiplanar reformatted images are provided for review. Automated exposure control, iterative reconstruction, and/or weight-based adjustment of the mA/kV was utilized to reduce the radiation dose to as low as reasonably achievable. COMPARISON: 10/06/2021 CLINICAL HISTORY: Pancreatitis, acute, severe. FINDINGS: LOWER CHEST: Posterior bibasilar dependent atelectasis. Right coronary artery atherosclerosis. Small volume symmetric bilateral gynecomastia. LIVER: Diffuse hepatic steatosis. GALLBLADDER AND BILE DUCTS: Small amount of inflammatory stranding about the gallbladder neck is also likely related to pancreatitis. No gallbladder wall thickening or radiopaque stones. No biliary ductal dilatation. SPLEEN: No acute abnormality. PANCREAS: Acute peripancreatic edema and fluid surrounding the entire pancreatic parenchyma, extending into the root of  the mesentery, the left anterior pararenal space, the gastrosplenic ligament, and the left paracolic gutter. The fluid around the pancreatic head extends into the pancreaticoduodenal groove, the right anterior pararenal space, and the right paracolic gutter. Small region of hypoattenuation of the pancreatic head measuring 1.4 cm axial is indeterminate, but likely interdigitating edema. Close follow up is recommended as early pancreatic necrosis could have this appearance. No well formed or drainable peripancreatic fluid collection. ADRENAL GLANDS: No acute abnormality. KIDNEYS, URETERS AND BLADDER: No stones in the kidneys or ureters. No hydronephrosis. No perinephric or periureteral stranding. Urinary bladder is unremarkable. GI AND BOWEL: Stomach demonstrates no acute abnormality. There is no bowel obstruction. Total colonic diverticulosis. No changes of acute diverticulitis. Gas filled noninflamed appendix. PERITONEUM AND RETROPERITONEUM: No ascites. No free air. VASCULATURE: Aorta is normal in caliber. Diffuse aortoiliac atherosclerosis. LYMPH NODES: No lymphadenopathy. REPRODUCTIVE ORGANS: No prostatomegaly. BONES AND SOFT TISSUES: Multilevel degenerative disc disease of the thoracolumbar spine. Mild bilateral hip osteoarthritis with subchondral cystic change. No acute osseous abnormality. No focal soft tissue abnormality. IMPRESSION: 1. Findings consistent with acute interstitial edematous pancreatitis. Indeterminate 1.4 cm region of hypoattenuation in the pancreatic head, which may represent interdigitating edema. Alternatively, early pancreatic necrosis could have this appearance and close cross-sectional imaging follow-up is recommended.No well-formed or drainable peripancreatic fluid collection. 2. Total colonic diverticulosis. 3. Diffuse hepatic steatosis. Electronically signed by: Rogelia Myers MD 01/28/2024 11:18 AM EST RP Workstation: [id number]   DG Chest Portable 1 View Result Date:  01/28/2024 CLINICAL DATA:  Cough. EXAM: PORTABLE CHEST 1 VIEW COMPARISON:  None Available. FINDINGS: The heart size and mediastinal contours are within normal limits. Both lungs are clear. The visualized skeletal structures are unremarkable. IMPRESSION: No active disease. Electronically Signed   By: Norleen DELENA Kil M.D.   On: 01/28/2024 10:21   Assessment/Plan Alban Marucci is a 58 y.o. male with medical history significant for insulin -dependent type 2 diabetes, dyslipidemia, hypertension, prior idiopathic pancreatitis be admitted to the hospital with acute pancreatitis.    Acute pancreatitis-without cause identified, no ductal dilatation or other abnormality on right upper quadrant ultrasound.  In the past, he has had idiopathic pancreatitis.  Triglycerides in July 219.  CT with acute interstitial edematous pancreatitis, possible 1.4 cm area of early pancreatic necrosis.  Will need repeat imaging for this. -Observation admission -N.p.o. except for ice chips and sips with meds, will plan to advance diet slowly as pain subsides -Aggressive IV fluid hydration -Pain and nausea control as needed  Insulin -dependent type 2 diabetes-not well-controlled, last A1c 8.8 -N.p.o. as above, plan for carb modified diet when advanced -Continue basal insulin  at half dose -Moderate dose sliding scale insulin  every 4 hours  History of superior mesenteric artery thrombosis-continue Eliquis   Hyperlipidemia-Lipitor  Hypertension-Diovan , IV hydralazine  as needed    Code Status: Full Code  Consults called: None  Admission status: Observation  Time spent: 46 minutes  Vallerie Hentz CHRISTELLA Gail MD [facility] Pager [phone]  If 7PM-7AM, please contact night-coverage [email or web address] Password [email or web address]  01/28/2024, 12:54 PM      [1] No Known Allergies  "

## 2024-01-28 NOTE — ED Triage Notes (Signed)
 Pt arrives via [facility] from home for abd pain and N/V that started yesterday. Pt states he has hx of pancreatitis and feels similar. Also worried he could have the flu as he's had a cough and chills. Pt given 100 mcg fentanyl  enroute.

## 2024-01-29 ENCOUNTER — Encounter (HOSPITAL_COMMUNITY): Payer: Self-pay | Admitting: Internal Medicine

## 2024-01-29 DIAGNOSIS — E66812 Obesity, class 2: Secondary | ICD-10-CM | POA: Diagnosis present

## 2024-01-29 DIAGNOSIS — Z860101 Personal history of adenomatous and serrated colon polyps: Secondary | ICD-10-CM | POA: Diagnosis not present

## 2024-01-29 DIAGNOSIS — Z833 Family history of diabetes mellitus: Secondary | ICD-10-CM | POA: Diagnosis not present

## 2024-01-29 DIAGNOSIS — Z86718 Personal history of other venous thrombosis and embolism: Secondary | ICD-10-CM | POA: Diagnosis not present

## 2024-01-29 DIAGNOSIS — K861 Other chronic pancreatitis: Secondary | ICD-10-CM | POA: Diagnosis present

## 2024-01-29 DIAGNOSIS — R140 Abdominal distension (gaseous): Secondary | ICD-10-CM | POA: Diagnosis present

## 2024-01-29 DIAGNOSIS — E781 Pure hyperglyceridemia: Secondary | ICD-10-CM | POA: Diagnosis present

## 2024-01-29 DIAGNOSIS — Z87891 Personal history of nicotine dependence: Secondary | ICD-10-CM | POA: Diagnosis not present

## 2024-01-29 DIAGNOSIS — Z79899 Other long term (current) drug therapy: Secondary | ICD-10-CM | POA: Diagnosis not present

## 2024-01-29 DIAGNOSIS — E785 Hyperlipidemia, unspecified: Secondary | ICD-10-CM | POA: Diagnosis not present

## 2024-01-29 DIAGNOSIS — K8581 Other acute pancreatitis with uninfected necrosis: Secondary | ICD-10-CM | POA: Diagnosis present

## 2024-01-29 DIAGNOSIS — D751 Secondary polycythemia: Secondary | ICD-10-CM

## 2024-01-29 DIAGNOSIS — Z1152 Encounter for screening for COVID-19: Secondary | ICD-10-CM | POA: Diagnosis not present

## 2024-01-29 DIAGNOSIS — E871 Hypo-osmolality and hyponatremia: Secondary | ICD-10-CM | POA: Diagnosis present

## 2024-01-29 DIAGNOSIS — I10 Essential (primary) hypertension: Secondary | ICD-10-CM | POA: Diagnosis present

## 2024-01-29 DIAGNOSIS — K760 Fatty (change of) liver, not elsewhere classified: Secondary | ICD-10-CM

## 2024-01-29 DIAGNOSIS — Z794 Long term (current) use of insulin: Secondary | ICD-10-CM | POA: Diagnosis not present

## 2024-01-29 DIAGNOSIS — E1165 Type 2 diabetes mellitus with hyperglycemia: Secondary | ICD-10-CM | POA: Diagnosis present

## 2024-01-29 DIAGNOSIS — Z7901 Long term (current) use of anticoagulants: Secondary | ICD-10-CM | POA: Diagnosis not present

## 2024-01-29 DIAGNOSIS — K859 Acute pancreatitis without necrosis or infection, unspecified: Secondary | ICD-10-CM | POA: Diagnosis present

## 2024-01-29 DIAGNOSIS — Z6837 Body mass index (BMI) 37.0-37.9, adult: Secondary | ICD-10-CM | POA: Diagnosis not present

## 2024-01-29 DIAGNOSIS — Z7985 Long-term (current) use of injectable non-insulin antidiabetic drugs: Secondary | ICD-10-CM | POA: Diagnosis not present

## 2024-01-29 LAB — HIV ANTIBODY (ROUTINE TESTING W REFLEX): HIV Screen 4th Generation wRfx: NONREACTIVE

## 2024-01-29 LAB — GLUCOSE, CAPILLARY
Glucose-Capillary: 165 mg/dL — ABNORMAL HIGH (ref 70–99)
Glucose-Capillary: 172 mg/dL — ABNORMAL HIGH (ref 70–99)
Glucose-Capillary: 187 mg/dL — ABNORMAL HIGH (ref 70–99)
Glucose-Capillary: 196 mg/dL — ABNORMAL HIGH (ref 70–99)
Glucose-Capillary: 208 mg/dL — ABNORMAL HIGH (ref 70–99)
Glucose-Capillary: 209 mg/dL — ABNORMAL HIGH (ref 70–99)
Glucose-Capillary: 290 mg/dL — ABNORMAL HIGH (ref 70–99)

## 2024-01-29 LAB — BASIC METABOLIC PANEL WITH GFR
Anion gap: 13 (ref 5–15)
BUN: 12 mg/dL (ref 6–20)
CO2: 26 mmol/L (ref 22–32)
Calcium: 10 mg/dL (ref 8.9–10.3)
Chloride: 95 mmol/L — ABNORMAL LOW (ref 98–111)
Creatinine, Ser: 0.82 mg/dL (ref 0.61–1.24)
GFR, Estimated: >60 mL/min
Glucose, Bld: 181 mg/dL — ABNORMAL HIGH (ref 70–99)
Potassium: 3.7 mmol/L (ref 3.5–5.1)
Sodium: 134 mmol/L — ABNORMAL LOW (ref 135–145)

## 2024-01-29 LAB — CBC
HCT: 49.8 % (ref 39.0–52.0)
Hemoglobin: 17.4 g/dL — ABNORMAL HIGH (ref 13.0–17.0)
MCH: 34.7 pg — ABNORMAL HIGH (ref 26.0–34.0)
MCHC: 34.9 g/dL (ref 30.0–36.0)
MCV: 99.4 fL (ref 80.0–100.0)
Platelets: 155 K/uL (ref 150–400)
RBC: 5.01 MIL/uL (ref 4.22–5.81)
RDW: 12.6 % (ref 11.5–15.5)
WBC: 16.3 K/uL — ABNORMAL HIGH (ref 4.0–10.5)
nRBC: 0 % (ref 0.0–0.2)

## 2024-01-29 LAB — MAGNESIUM: Magnesium: 1.7 mg/dL (ref 1.7–2.4)

## 2024-01-29 LAB — HEPATIC FUNCTION PANEL
ALT: 36 U/L (ref 0–44)
AST: 26 U/L (ref 15–41)
Albumin: 4 g/dL (ref 3.5–5.0)
Alkaline Phosphatase: 65 U/L (ref 38–126)
Bilirubin, Direct: 0.5 mg/dL — ABNORMAL HIGH (ref 0.0–0.2)
Indirect Bilirubin: 0.9 mg/dL (ref 0.3–0.9)
Total Bilirubin: 1.4 mg/dL — ABNORMAL HIGH (ref 0.0–1.2)
Total Protein: 7.2 g/dL (ref 6.5–8.1)

## 2024-01-29 LAB — PHOSPHORUS: Phosphorus: 2.4 mg/dL — ABNORMAL LOW (ref 2.5–4.6)

## 2024-01-29 LAB — LIPASE, BLOOD: Lipase: 230 U/L — ABNORMAL HIGH (ref 11–51)

## 2024-01-29 MED ORDER — LACTATED RINGERS IV SOLN: INTRAVENOUS | Status: DC

## 2024-01-29 MED ORDER — MAGNESIUM SULFATE 2 GM/50ML IV SOLN
2.0000 g | Freq: Once | INTRAVENOUS | Status: AC
  Administered 2024-01-29: 2 g via INTRAVENOUS
  Filled 2024-01-29: qty 50

## 2024-01-29 NOTE — Plan of Care (Signed)

## 2024-01-29 NOTE — Consult Note (Addendum)
 "    Consultation  Referring Provider:     Dr. Sherrill, [facility] Primary Care Physician:  Theotis Haze ORN, NP Primary Gastroenterologist:    Leigh     Reason for Consultation:     Pancreatitis     Impression / Plan:   Acute and recurrent pancreatitis, third episode since 2021, imaging today has raise the possibility of an area of necrosis though he looks good and I doubt that.  Cause unclear, no evidence for biliary cause at this time and in past, does have history of hypertriglyceridemia but not greater than 1000, must consider medications notably he has a history of using Ozempic  and Trulicity  both of which can cause it though he was not on those during his first episode.  Also he has been on atorvastatin  during each of these episodes and it is a possible culprit.  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  ---------------------------------------------------------------------------------------------------------------------------------------------------- Will advance diet to full liquids and soft low-fat carb modified as tolerated.  Continue current care recheck labs in a.m. go ahead and check a lipid panel though outpatient triglyceride levels are more predictive I think it is worthwhile to check that  Stop atorvastatin  and Trulicity  at this time  Will need to sort out other medication to treat dyslipidemia/hypertriglyceridemia.   It is quite possible his prior hypertriglyceridemia was related to poorly controlled diabetes mellitus type 2  It would be best for him to have an endoscopic ultrasound later on as an outpatient, not urgent to look for potential pancreatic abnormalities and occult cholelithiasis (though normal transaminases goes against this)  I will see him again tomorrow, depending upon how he is doing he could potentially be discharged          HPI:   Riley Wiggins is a 58 y.o. male with  a history of acute pancreatitis twice before and was admitted overnight with another episode.  Sudden onset of upper abdominal pain and some nausea and vomiting which is subsided.  He still has epigastric and periumbilical pain though less than admission.  CT scanning has shown changes as outlined below.  I have reviewed the images.  He has a history of SMV branch thrombosis and takes Eliquis .  He has a history of dyslipidemia and has had elevated triglycerides in the past but never greater than the thousand and when they were very high it was in the setting of poorly controlled type 2 diabetes mellitus.  He drinks little alcohol maybe 1 or 2 beers on the weekends but not every weekend.  No family history of pancreatitis.  He denies nausea vomiting since admission, he does not have dysphagia, bowel habits are regular though he has not had a bowel movement since admission.  He is passing flatus.  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     Past Surgical History:  Procedure Laterality Date   COLONOSCOPY  2023   HERNIA REPAIR      when I was young     Family History  Problem Relation Age of Onset   Diabetes Mother    Diabetes  Father    Pancreatitis Neg Hx    Colon cancer Neg Hx    Stomach cancer Neg Hx      Social History[1]  Social History   Social History Narrative   Truck driver   L[place] in [place] with girlfriend she has 2 children he has 2 children   Rare alcohol   Former smoker no drug use    Prior to Admission medications  Medication Sig Start Date End Date Taking? Authorizing Provider  allopurinol  (ZYLOPRIM ) 100 MG tablet Take 1 tablet (100  mg total) by mouth daily. 11/29/23  Yes Theotis Haze ORN, NP  apixaban  (ELIQUIS ) 5 MG TABS tablet Take 1 tablet (5 mg total) by mouth 2 (two) times daily. Patient taking differently: Take 5 mg by mouth daily at 6 (six) AM. 12/23/23  Yes Theotis Haze ORN, NP  atorvastatin  (LIPITOR) 40 MG tablet Take 1 tablet (40 mg total) by mouth daily. 08/21/23 02/21/24 Yes Patel, Vaishali K, RPH  dapagliflozin  propanediol (FARXIGA ) 10 MG TABS tablet Take 1 tablet (10 mg total) by mouth daily. 12/12/23  Yes Newlin, Enobong, MD  Dulaglutide  (TRULICITY ) 0.75 MG/0.5ML SOAJ Inject 0.75 mg into the skin once a week. Patient taking differently: Inject 0.75 mg into the skin every Saturday. 11/29/23  Yes Theotis Haze ORN, NP  Evolocumab  (REPATHA  SURECLICK) 140 MG/ML SOAJ Inject 140 mg into the skin every 14 (fourteen) days. 05/23/23  Yes Chandrasekhar, Mahesh A, MD  Insulin  Glargine (BASAGLAR  KWIKPEN) 100 UNIT/ML Inject 30 Units into the skin daily. Patient taking differently: Inject 30 Units into the skin in the morning. 11/29/23  Yes Theotis Haze ORN, NP  Multiple Vitamins-Minerals (MULTI FOR HIM 50+) TABS Take 1 tablet by mouth daily with breakfast.   Yes [provider]  Omega-3 Fatty Acids (OMEGA 3 FISH OIL PO) Take 1 capsule by mouth daily with breakfast.   Yes [provider]  sildenafil  (VIAGRA ) 100 MG tablet Take 0.5-1 tablets (50-100 mg total) by mouth daily as needed for erectile dysfunction. 09/10/22  Yes Fleming, Zelda W, NP  valsartan -hydrochlorothiazide  (DIOVAN -HCT) 160-25 MG tablet Take 1 tablet by mouth daily. 11/29/23  Yes Theotis Haze ORN, NP  Vitamin D , Ergocalciferol , (DRISDOL ) 1.25 MG (50000 UNIT) CAPS capsule Take 1 capsule (50,000 Units total) by mouth every 7 (seven) days. Patient taking differently: Take 50,000 Units by mouth every Saturday. 07/22/23  Yes Theotis Haze ORN, NP  Accu-Chek Softclix Lancets lancets Use to check blood sugar 3 times daily. 05/02/23   Newlin, Enobong, MD  Blood  Glucose Monitoring Suppl (ACCU-CHEK GUIDE) w/Device KIT Use to check blood sugar 3 times daily. 05/02/23   Newlin, Enobong, MD  Continuous Glucose Sensor (FREESTYLE LIBRE 3 PLUS SENSOR) MISC Change sensor every 15 days. Patient not taking: Reported on 01/28/2024 11/29/23   Theotis Haze ORN, NP  Continuous Glucose Sensor (FREESTYLE LIBRE 3 SENSOR) MISC Place 1 sensor on the skin every 14 days. Use to check glucose continuously. Patient not taking: Reported on 01/28/2024 07/10/22   Newlin, Enobong, MD  glucose blood (ACCU-CHEK GUIDE TEST) test strip Use to check blood sugar 3 times daily. 05/02/23   Newlin, Enobong, MD  Insulin  Pen Needle (TECHLITE PEN NEEDLES) 31G X 5 MM MISC Use as instructed to inject insulin  10/21/23   Delbert Clam, MD    Current Facility-Administered Medications  Medication Dose Route Frequency Provider Last Rate Last Admin   acetaminophen  (TYLENOL ) tablet 650 mg  650 mg Oral Q6H PRN Zella Katha HERO, MD  Or   acetaminophen  (TYLENOL ) suppository 650 mg  650 mg Rectal Q6H PRN Zella, Mir M, MD       albuterol  (PROVENTIL ) (2.5 MG/3ML) 0.083% nebulizer solution 2.5 mg  2.5 mg Nebulization Q2H PRN Zella, Mir M, MD       alum & mag hydroxide-simeth (MAALOX/MYLANTA) 200-200-20 MG/5ML suspension 30 mL  30 mL Oral Q4H PRN Zella, Mir M, MD       apixaban  (ELIQUIS ) tablet 5 mg  5 mg Oral BID Zella, Mir M, MD   5 mg at 01/29/24 0913   atorvastatin  (LIPITOR) tablet 40 mg  40 mg Oral Daily Ikramullah, Mir M, MD   40 mg at 01/29/24 0913   hydrALAZINE  (APRESOLINE ) injection 5 mg  5 mg Intravenous Q6H PRN Zella Katha HERO, MD   5 mg at 01/29/24 9491   HYDROmorphone  (DILAUDID ) injection 1 mg  1 mg Intravenous Q2H PRN Zella, Mir M, MD   1 mg at 01/29/24 1452   insulin  aspart (novoLOG ) injection 0-15 Units  0-15 Units Subcutaneous Q4H Zella Mir M, MD   3 Units at 01/29/24 1717   insulin  glargine (LANTUS ) injection 15 Units  15 Units Subcutaneous Daily Lynwood Setter, RPH   15 Units at 01/29/24 9086   irbesartan  (AVAPRO ) tablet 150 mg  150 mg Oral Daily Ikramullah, Mir M, MD   150 mg at 01/29/24 0913   labetalol  (NORMODYNE ) injection 10 mg  10 mg Intravenous Q2H PRN Zella Katha HERO, MD   10 mg at 01/28/24 1836   lactated ringers  infusion   Intravenous Continuous Sherrill Cable [place], DO 75 mL/hr at 01/29/24 1714 New Bag at 01/29/24 1714   magnesium  sulfate IVPB 2 g 50 mL  2 g Intravenous Once Sheikh, Omair Latif, DO 50 mL/hr at 01/29/24 1716 2 g at 01/29/24 1716   ondansetron  (ZOFRAN ) injection 4 mg  4 mg Intravenous Q6H PRN Zella Katha HERO, MD       oxyCODONE  (Oxy IR/ROXICODONE ) immediate release tablet 5 mg  5 mg Oral Q4H PRN Zella Katha HERO, MD        Allergies as of 01/28/2024   (No Known Allergies)     Review of Systems:    This is positive for those things mentioned in the HPI. All other review of systems are negative.       Physical Exam:  Vital signs in last 24 hours: Temp:  [97.5 F (36.4 C)-98.9 F (37.2 C)] 98.6 F (37 C) (12/24 1455) Pulse Rate:  [99-107] 107 (12/24 1455) Resp:  [17-18] 18 (12/24 0457) BP: (136-182)/(76-110) 157/97 (12/24 1455) SpO2:  [92 %-97 %] 95 % (12/24 1455)    General:  Well-developed, well-nourished and in no acute distress Eyes:  anicteric. Lungs: Clear to auscultation bilaterally. Heart:   S1S2, no rubs, murmurs, gallops. Abdomen:  soft, obese, mildly tender epigastrium, no hepatosplenomegaly, hernia, or mass and BS+.  Lymph:  no cervical or supraclavicular adenopathy. Extremities:   no edema Skin   no rash. Neuro:  A&O x 3.  Psych:  appropriate mood and  Affect.   Data Reviewed:   LAB RESULTS: Recent Labs    01/28/24 0819 01/29/24 0435  WBC 17.8* 16.3*  HGB 17.7* 17.4*  HCT 50.8 49.8  PLT 182 155   BMET Recent Labs    01/28/24 0819 01/29/24 0435  NA 139 134*  K 2.9* 3.7  CL 101 95*  CO2 25 26  GLUCOSE 232* 181*  BUN 13 12  CREATININE 0.73 0.82  CALCIUM  9.1  10.0   LFT Recent Labs    01/29/24 0435  PROT 7.2  ALBUMIN 4.0  AST 26  ALT 36  ALKPHOS 65  BILITOT 1.4*  BILIDIR 0.5*  IBILI 0.9   Lab Results  Component Value Date   LIPASE 230 (H) 01/29/2024     STUDIES: I have personally reviewed the images of the studies US  Abdomen Limited RUQ (LIVER/GB) Result Date: 01/28/2024 EXAM: Right Upper Quadrant Abdominal Ultrasound 01/28/2024 10:06:00 AM TECHNIQUE: Real-time ultrasonography of the right upper quadrant of the abdomen was performed. COMPARISON: US  Abdomen 10/06/2021; 03/12/2020. CLINICAL HISTORY: RUQ pain. FINDINGS: LIVER: Mild hepatic steatosis. No intrahepatic biliary ductal dilatation. No evidence of mass. Hepatopetal flow in the portal vein. BILIARY SYSTEM: Gallbladder wall thickness measures 1.0 mm. No pericholecystic fluid. No cholelithiasis. No sonographic Murphy sign. The common bile duct measures 2.3 mm. OTHER: No right upper quadrant ascites. IMPRESSION: 1. No acute cholecystolithiasis or changes of acute cholecystitis. Electronically signed by: Rogelia Myers MD 01/28/2024 11:21 AM EST RP Workstation: [id number]   CT ABDOMEN PELVIS W CONTRAST Result Date: 01/28/2024 EXAM: CT ABDOMEN AND PELVIS WITH CONTRAST 01/28/2024 10:33:24 AM TECHNIQUE: CT of the abdomen and pelvis was performed with the administration of 100 mL of iohexol  (OMNIPAQUE ) 300 MG/ML solution. Multiplanar reformatted images are provided for review. Automated exposure control, iterative reconstruction, and/or weight-based adjustment of the mA/kV was utilized to reduce the radiation dose to as low as reasonably achievable. COMPARISON: 10/06/2021 CLINICAL HISTORY: Pancreatitis, acute, severe. FINDINGS: LOWER CHEST: Posterior bibasilar dependent atelectasis. Right coronary artery atherosclerosis. Small volume symmetric bilateral gynecomastia. LIVER: Diffuse hepatic steatosis. GALLBLADDER AND BILE DUCTS: Small amount of inflammatory stranding about the gallbladder neck is  also likely related to pancreatitis. No gallbladder wall thickening or radiopaque stones. No biliary ductal dilatation. SPLEEN: No acute abnormality. PANCREAS: Acute peripancreatic edema and fluid surrounding the entire pancreatic parenchyma, extending into the root of the mesentery, the left anterior pararenal space, the gastrosplenic ligament, and the left paracolic gutter. The fluid around the pancreatic head extends into the pancreaticoduodenal groove, the right anterior pararenal space, and the right paracolic gutter. Small region of hypoattenuation of the pancreatic head measuring 1.4 cm axial is indeterminate, but likely interdigitating edema. Close follow up is recommended as early pancreatic necrosis could have this appearance. No well formed or drainable peripancreatic fluid collection. ADRENAL GLANDS: No acute abnormality. KIDNEYS, URETERS AND BLADDER: No stones in the kidneys or ureters. No hydronephrosis. No perinephric or periureteral stranding. Urinary bladder is unremarkable. GI AND BOWEL: Stomach demonstrates no acute abnormality. There is no bowel obstruction. Total colonic diverticulosis. No changes of acute diverticulitis. Gas filled noninflamed appendix. PERITONEUM AND RETROPERITONEUM: No ascites. No free air. VASCULATURE: Aorta is normal in caliber. Diffuse aortoiliac atherosclerosis. LYMPH NODES: No lymphadenopathy. REPRODUCTIVE ORGANS: No prostatomegaly. BONES AND SOFT TISSUES: Multilevel degenerative disc disease of the thoracolumbar spine. Mild bilateral hip osteoarthritis with subchondral cystic change. No acute osseous abnormality. No focal soft tissue abnormality. IMPRESSION: 1. Findings consistent with acute interstitial edematous pancreatitis. Indeterminate 1.4 cm region of hypoattenuation in the pancreatic head, which may represent interdigitating edema. Alternatively, early pancreatic necrosis could have this appearance and close cross-sectional imaging follow-up is recommended.No  well-formed or drainable peripancreatic fluid collection. 2. Total colonic diverticulosis. 3. Diffuse hepatic steatosis. Electronically signed by: Rogelia Myers MD 01/28/2024 11:18 AM EST RP Workstation: [id number]   DG Chest Portable 1 View Result Date: 01/28/2024 CLINICAL DATA:  Cough. EXAM: PORTABLE CHEST 1 VIEW COMPARISON:  None Available. FINDINGS:  The heart size and mediastinal contours are within normal limits. Both lungs are clear. The visualized skeletal structures are unremarkable. IMPRESSION: No active disease. Electronically Signed   By: Norleen DELENA Kil M.D.   On: 01/28/2024 10:21     PREVIOUS ENDOSCOPIES:            Colonoscopy 2023 diminutive adenoma, diverticulosis   Thanks   LOS: 0 days   @Helen Winterhalter  CHARLENA Commander, MD, [facility] @  01/29/2024, 5:34 PM     [1]  Social History Tobacco Use   Smoking status: Former    Current packs/day: 0.00    Types: Cigarettes    Start date: 62    Quit date: 2009    Years since quitting: 16.9   Smokeless tobacco: Never   Tobacco comments:    Quit smoking 5 years ago  Vaping Use   Vaping status: Never Used  Substance Use Topics   Alcohol use: Yes    Comment: rare   Drug use: No   "

## 2024-01-29 NOTE — Progress Notes (Addendum)
 " PROGRESS NOTE    Riley Wiggins  FMW:[id number] DOB: 01-28-1966 DOA: 01/28/2024 PCP: Theotis Haze ORN, NP   Brief Narrative:  Riley Wiggins is a 58 y.o. male with medical history significant for insulin -dependent type 2 diabetes, dyslipidemia, hypertension, prior idiopathic pancreatitis be admitted to the hospital with acute pancreatitis.  Patient states that starting a couple of days ago, he started having epigastric abdominal pain, without radiation.  There was associated nausea without vomiting, and diarrhea.  There was also some abdominal distention.  The symptoms are all reminiscent of when he had pancreatitis back in 2023.    Getting IVF and diet slowly advancing. GI consulted for further evaluation and recommendations.  Assessment and Plan:  Acute Pancreatitis: without cause identified, no ductal dilatation or other abnormality on right upper quadrant ultrasound.  In the past, he has had idiopathic pancreatitis.  Triglycerides in July 219.   -CT with acute interstitial edematous pancreatitis, possible 1.4 cm area of early pancreatic necrosis.  Will need repeat imaging for this. -Observation admission and now Admitted as Inpt.  -Was N.p.o. except for ice chips and sips with meds, will plan to advance diet slowly as pain subsides; Now advanced to CLD -Lipase Lvl went from 505 -> 230 -Aggressive IV fluid hydration; Was getting LR @ 200 mL/hr x1 Day and this timed out and will renew w/ LR @ 75 mL/hr x1 Day.  -Pain Control w/ po/RC Acetaminophen  650 mg po q6hprn Mild Pain/Fever, Oxycodone  5 mg po q4hprn Moderate Pain, and IV Hydromorphone  q2hoprn Severe Pain;   -C/w Nausea control w/ IV Ondansetron  4 mg q6hprn  -GI to be consulted for further evaluation and recommendations.    Insulin -Dependent Type 2 Diabetes Mellitus, Uncontrolled: Not well-controlled, last A1c 8.8;  Was NPO. as above, plan for carb modified diet when advanced and tolerating but on CLD for now. Continue basal insulin  at half  dose at 15 units sq Daily. C/w Moderate Novolog  SSI q4h. CTM CBGs per Protocol. CBG Trend:  Recent Labs  Lab 01/28/24 1648 01/28/24 1829 01/28/24 1954 01/29/24 0000 01/29/24 0455 01/29/24 0726 01/29/24 1112  GLUCAP 195* 214* 183* 172* 209* 187* 165*   Leukocytosis: In the setting of Above. WBC went from 17.8 -> 16.3. CTM for S/Sx of Infection. Repeat CBC in the AM.   HypoNatremia: Na+ went from 139 -> 134. Discontinue the Hydrochlorothiazide . Replete w/ IV Na Phos 20 mmol. CTM and Replete as Necessary. Repeat CMP in the AM  Hypophosphatemia: Phos Level was 2.4. Replete w/ IV Na Phos 20 mmol. CTM and Replete as Necessary. Repeat Phos Lvl in the AM  Hx of Superior Mesenteric Artery Thrombosis: Continue Apixaban  5 mg po BID   Hyperlipidemia: C/w Atorvastatin  40 mg po daily    Essential HTN: C/w Irbesartan  150 mg po Daily. D/C Hydrochlorothiazide . CTM BP per Protocol. Last BP reading was 157/97  Erythrocytosis: Mild. Hgb went from 17.7 -> 17.4. CTM and Trend and repeat CBC in the AM   Hyperbilirubinemia: T Bili Trend went from 1.2 -> 1.4. CTM and Trend and Repeat CMP in the AM  Class II Obesity: Complicates overall prognosis and care. Estimated body mass index is 37.6 kg/m as calculated from the following:   Height as of this encounter: 6' 1 (1.854 m).   Weight as of this encounter: 129.3 kg. Weight Loss and Dietary Counseling given   DVT prophylaxis:  apixaban  (ELIQUIS ) tablet 5 mg    Code Status: Full Code Family Communication: D/w wife @ bedside  Disposition Plan:  Level of care: Med-Surg Status is: Inpatient Remains inpatient appropriate because: Needs further clinical improvement and clearance by the specialists    Consultants:  Gastroenterology   Procedures:  Ad delineated as above  Antimicrobials:  Anti-infectives (From admission, onward)    None       Subjective: Seen and examined at bedside has nausea and vomiting is improved but still having some  abdominal discomfort and states it is 9 out of 10 in severity.  Willing to try clear liquid diet though.  No chest pain or shortness breath.  No other concerns or complaints at this time.  Objective: Vitals:   01/29/24 0001 01/29/24 0457 01/29/24 0557 01/29/24 1455  BP: (!) 174/99 (!) 165/86 136/76 (!) 157/97  Pulse: 100 99 (!) 101 (!) 107  Resp: 18 18    Temp: 98.8 F (37.1 C) 98.9 F (37.2 C)  98.6 F (37 C)  TempSrc: Oral Oral  Oral  SpO2: 93% 97%  95%  Weight:      Height:        Intake/Output Summary (Last 24 hours) at 01/29/2024 1628 Last data filed at 01/29/2024 1521 Gross per 24 hour  Intake 5843.17 ml  Output --  Net 5843.17 ml   Filed Weights   01/28/24 1413  Weight: 129.3 kg   Examination: Physical Exam:  Constitutional: WN/WD obese African-American male in no acute distress Respiratory: Diminished to auscultation bilaterally, no wheezing, rales, rhonchi or crackles. Normal respiratory effort and patient is not tachypenic. No accessory muscle use.  Unlabored breathing Cardiovascular: RRR, no murmurs / rubs / gallops. S1 and S2 auscultated.  Minimal extremity edema Abdomen: Soft, tender to palpate and distended secondary body habitus. Bowel sounds positive.  GU: Deferred. Musculoskeletal: No clubbing / cyanosis of digits/nails. No joint deformity upper and lower extremities.  Skin: No rashes, lesions, ulcers limited skin evaluation. No induration; Warm and dry.  Neurologic: CN 2-12 grossly intact with no focal deficits. Romberg sign and cerebellar reflexes not assessed.  Psychiatric: Normal judgment and insight. Alert and oriented x 3. Normal mood and appropriate affect.   Data Reviewed: I have personally reviewed following labs and imaging studies  CBC: Recent Labs  Lab 01/28/24 0819 01/29/24 0435  WBC 17.8* 16.3*  NEUTROABS 15.6*  --   HGB 17.7* 17.4*  HCT 50.8 49.8  MCV 98.8 99.4  PLT 182 155   Basic Metabolic Panel: Recent Labs  Lab  01/28/24 0819 01/29/24 0435  NA 139 134*  K 2.9* 3.7  CL 101 95*  CO2 25 26  GLUCOSE 232* 181*  BUN 13 12  CREATININE 0.73 0.82  CALCIUM  9.1 10.0  MG  --  1.7  PHOS  --  2.4*   GFR: Estimated Creatinine Clearance: 138.5 mL/min (by C-G formula based on SCr of 0.82 mg/dL). Liver Function Tests: Recent Labs  Lab 01/28/24 0819 01/29/24 0435  AST 27 26  ALT 48* 36  ALKPHOS 63 65  BILITOT 1.2 1.4*  PROT 6.5 7.2  ALBUMIN 3.9 4.0   Recent Labs  Lab 01/28/24 0819 01/29/24 0435  LIPASE 505* 230*   No results for input(s): AMMONIA in the last 168 hours. Coagulation Profile: No results for input(s): INR, PROTIME in the last 168 hours. Cardiac Enzymes: No results for input(s): CKTOTAL, CKMB, CKMBINDEX, TROPONINI in the last 168 hours. BNP (last 3 results) No results for input(s): PROBNP in the last 8760 hours. HbA1C: No results for input(s): HGBA1C in the last 72 hours. CBG: Recent  Labs  Lab 01/28/24 1954 01/29/24 0000 01/29/24 0455 01/29/24 0726 01/29/24 1112  GLUCAP 183* 172* 209* 187* 165*   Lipid Profile: No results for input(s): CHOL, HDL, LDLCALC, TRIG, CHOLHDL, LDLDIRECT in the last 72 hours. Thyroid Function Tests: No results for input(s): TSH, T4TOTAL, FREET4, T3FREE, THYROIDAB in the last 72 hours. Anemia Panel: No results for input(s): VITAMINB12, FOLATE, FERRITIN, TIBC, IRON, RETICCTPCT in the last 72 hours. Sepsis Labs: Recent Labs  Lab 01/28/24 0835 01/28/24 1059  LATICACIDVEN 2.2* 2.1*   Recent Results (from the past 240 hours)  Resp panel by RT-PCR (RSV, Flu A&B, Covid) Anterior Nasal Swab     Status: None   Collection Time: 01/28/24  8:19 AM   Specimen: Anterior Nasal Swab  Result Value Ref Range Status   SARS Coronavirus 2 by RT PCR NEGATIVE NEGATIVE Final    Comment: (NOTE) SARS-CoV-2 target nucleic acids are NOT DETECTED.  The SARS-CoV-2 RNA is generally detectable in upper  respiratory specimens during the acute phase of infection. The lowest concentration of SARS-CoV-2 viral copies this assay can detect is 138 copies/mL. A negative result does not preclude SARS-Cov-2 infection and should not be used as the sole basis for treatment or other patient management decisions. A negative result may occur with  improper specimen collection/handling, submission of specimen other than nasopharyngeal swab, presence of viral mutation(s) within the areas targeted by this assay, and inadequate number of viral copies(<138 copies/mL). A negative result must be combined with clinical observations, patient history, and epidemiological information. The expected result is Negative.  Fact Sheet for Patients:  bloggercourse.com  Fact Sheet for Healthcare Providers:  seriousbroker.it  This test is no t yet approved or cleared by the [place]  FDA and  has been authorized for detection and/or diagnosis of SARS-CoV-2 by FDA under an Emergency Use Authorization (EUA). This EUA will remain  in effect (meaning this test can be used) for the duration of the COVID-19 declaration under Section 564(b)(1) of the Act, 21 U.S.C.section 360bbb-3(b)(1), unless the authorization is terminated  or revoked sooner.       Influenza A by PCR NEGATIVE NEGATIVE Final   Influenza B by PCR NEGATIVE NEGATIVE Final    Comment: (NOTE) The Xpert Xpress SARS-CoV-2/FLU/RSV plus assay is intended as an aid in the diagnosis of influenza from Nasopharyngeal swab specimens and should not be used as a sole basis for treatment. Nasal washings and aspirates are unacceptable for Xpert Xpress SARS-CoV-2/FLU/RSV testing.  Fact Sheet for Patients: bloggercourse.com  Fact Sheet for Healthcare Providers: seriousbroker.it  This test is not yet approved or cleared by the [place]  FDA and has been  authorized for detection and/or diagnosis of SARS-CoV-2 by FDA under an Emergency Use Authorization (EUA). This EUA will remain in effect (meaning this test can be used) for the duration of the COVID-19 declaration under Section 564(b)(1) of the Act, 21 U.S.C. section 360bbb-3(b)(1), unless the authorization is terminated or revoked.     Resp Syncytial Virus by PCR NEGATIVE NEGATIVE Final    Comment: (NOTE) Fact Sheet for Patients: bloggercourse.com  Fact Sheet for Healthcare Providers: seriousbroker.it  This test is not yet approved or cleared by the [place]  FDA and has been authorized for detection and/or diagnosis of SARS-CoV-2 by FDA under an Emergency Use Authorization (EUA). This EUA will remain in effect (meaning this test can be used) for the duration of the COVID-19 declaration under Section 564(b)(1) of the Act, 21 U.S.C. section 360bbb-3(b)(1), unless the authorization is terminated  or revoked.  Performed at [facility], [address] [place], [place] [address]     Radiology Studies: US  Abdomen Limited RUQ (LIVER/GB) Result Date: 01/28/2024 EXAM: Right Upper Quadrant Abdominal Ultrasound 01/28/2024 10:06:00 AM TECHNIQUE: Real-time ultrasonography of the right upper quadrant of the abdomen was performed. COMPARISON: US  Abdomen 10/06/2021; 03/12/2020. CLINICAL HISTORY: RUQ pain. FINDINGS: LIVER: Mild hepatic steatosis. No intrahepatic biliary ductal dilatation. No evidence of mass. Hepatopetal flow in the portal vein. BILIARY SYSTEM: Gallbladder wall thickness measures 1.0 mm. No pericholecystic fluid. No cholelithiasis. No sonographic Murphy sign. The common bile duct measures 2.3 mm. OTHER: No right upper quadrant ascites. IMPRESSION: 1. No acute cholecystolithiasis or changes of acute cholecystitis. Electronically signed by: Rogelia Myers MD 01/28/2024 11:21 AM EST RP Workstation: [id number]    CT ABDOMEN PELVIS W CONTRAST Result Date: 01/28/2024 EXAM: CT ABDOMEN AND PELVIS WITH CONTRAST 01/28/2024 10:33:24 AM TECHNIQUE: CT of the abdomen and pelvis was performed with the administration of 100 mL of iohexol  (OMNIPAQUE ) 300 MG/ML solution. Multiplanar reformatted images are provided for review. Automated exposure control, iterative reconstruction, and/or weight-based adjustment of the mA/kV was utilized to reduce the radiation dose to as low as reasonably achievable. COMPARISON: 10/06/2021 CLINICAL HISTORY: Pancreatitis, acute, severe. FINDINGS: LOWER CHEST: Posterior bibasilar dependent atelectasis. Right coronary artery atherosclerosis. Small volume symmetric bilateral gynecomastia. LIVER: Diffuse hepatic steatosis. GALLBLADDER AND BILE DUCTS: Small amount of inflammatory stranding about the gallbladder neck is also likely related to pancreatitis. No gallbladder wall thickening or radiopaque stones. No biliary ductal dilatation. SPLEEN: No acute abnormality. PANCREAS: Acute peripancreatic edema and fluid surrounding the entire pancreatic parenchyma, extending into the root of the mesentery, the left anterior pararenal space, the gastrosplenic ligament, and the left paracolic gutter. The fluid around the pancreatic head extends into the pancreaticoduodenal groove, the right anterior pararenal space, and the right paracolic gutter. Small region of hypoattenuation of the pancreatic head measuring 1.4 cm axial is indeterminate, but likely interdigitating edema. Close follow up is recommended as early pancreatic necrosis could have this appearance. No well formed or drainable peripancreatic fluid collection. ADRENAL GLANDS: No acute abnormality. KIDNEYS, URETERS AND BLADDER: No stones in the kidneys or ureters. No hydronephrosis. No perinephric or periureteral stranding. Urinary bladder is unremarkable. GI AND BOWEL: Stomach demonstrates no acute abnormality. There is no bowel obstruction. Total colonic  diverticulosis. No changes of acute diverticulitis. Gas filled noninflamed appendix. PERITONEUM AND RETROPERITONEUM: No ascites. No free air. VASCULATURE: Aorta is normal in caliber. Diffuse aortoiliac atherosclerosis. LYMPH NODES: No lymphadenopathy. REPRODUCTIVE ORGANS: No prostatomegaly. BONES AND SOFT TISSUES: Multilevel degenerative disc disease of the thoracolumbar spine. Mild bilateral hip osteoarthritis with subchondral cystic change. No acute osseous abnormality. No focal soft tissue abnormality. IMPRESSION: 1. Findings consistent with acute interstitial edematous pancreatitis. Indeterminate 1.4 cm region of hypoattenuation in the pancreatic head, which may represent interdigitating edema. Alternatively, early pancreatic necrosis could have this appearance and close cross-sectional imaging follow-up is recommended.No well-formed or drainable peripancreatic fluid collection. 2. Total colonic diverticulosis. 3. Diffuse hepatic steatosis. Electronically signed by: Rogelia Myers MD 01/28/2024 11:18 AM EST RP Workstation: [id number]   DG Chest Portable 1 View Result Date: 01/28/2024 CLINICAL DATA:  Cough. EXAM: PORTABLE CHEST 1 VIEW COMPARISON:  None Available. FINDINGS: The heart size and mediastinal contours are within normal limits. Both lungs are clear. The visualized skeletal structures are unremarkable. IMPRESSION: No active disease. Electronically Signed   By: Norleen DELENA Kil M.D.   On: 01/28/2024 10:21   Scheduled Meds:  apixaban   5 mg Oral BID   atorvastatin   40 mg Oral Daily   insulin  aspart  0-15 Units Subcutaneous Q4H   insulin  glargine  15 Units Subcutaneous Daily   irbesartan   150 mg Oral Daily   Continuous Infusions:  lactated ringers      magnesium  sulfate bolus IVPB      LOS: 0 days   Alejandro Marker, DO [facility] Available via Epic secure chat 7am-7pm After these hours, please refer to coverage provider listed on [email or web address] 01/29/2024, 4:28 PM  "

## 2024-01-29 NOTE — Hospital Course (Addendum)
 Riley Wiggins is a 58 y.o. male with medical history significant for insulin -dependent type 2 diabetes, dyslipidemia, hypertension, prior idiopathic pancreatitis be admitted to the hospital with acute pancreatitis.  Patient states that starting a couple of days ago, he started having epigastric abdominal pain, without radiation.  There was associated nausea without vomiting, and diarrhea.  There was also some abdominal distention.  The symptoms are all reminiscent of when he had pancreatitis back in 2023.    Getting IVF and diet slowly advancing. GI consulted for further evaluation and recommendations.  He subsequently improved and GI recommended that even though there is no clear cause for his pancreatitis they are recommending holding Trulicity  and his atorvastatin  for now.  Recommended a low-fat carb modified diet and his pain was improved and did not have much tenderness.  He is medically stable for discharge only to follow-up with PCP and gastroenterology outpatient setting.  Assessment and Plan:  Acute Pancreatitis: without cause identified, no ductal dilatation or other abnormality on right upper quadrant ultrasound.  In the past, he has had idiopathic pancreatitis.  Triglycerides in July 219.   -CT with acute interstitial edematous pancreatitis, possible 1.4 cm area of early pancreatic necrosis.  Will need repeat imaging for this. -Observation admission and now Admitted as Inpt.  -Was N.p.o. except for ice chips and sips with meds, will plan to advance diet slowly as pain subsides; Now advanced to CLD -Lipase Lvl went from 505 -> 230 -> 177 on last check -Aggressive IV fluid hydration; Was getting LR @ 200 mL/hr x1 Day and this timed out and will renew w/ LR @ 75 mL/hr x1 Day.  -Pain Control w/ po/RC Acetaminophen  650 mg po q6hprn Mild Pain/Fever, Oxycodone  5 mg po q4hprn Moderate Pain, and IV Hydromorphone  q2hoprn Severe Pain;   -C/w Nausea control w/ IV Ondansetron  4 mg q6hprn  -GI to be  consulted for further evaluation and recommendations.    Insulin -Dependent Type 2 Diabetes Mellitus, Uncontrolled: Not well-controlled, last A1c 8.8;  Was NPO. as above, plan for carb modified diet when advanced and tolerating but on CLD for now. Continue basal insulin  at half dose at 15 units sq Daily. C/w Moderate Novolog  SSI q4h. CTM CBGs per Protocol. CBG Trend:  Recent Labs  Lab 01/29/24 0726 01/29/24 1112 01/29/24 1631 01/29/24 1944 01/29/24 2337 01/30/24 0358 01/30/24 0815  GLUCAP 187* 165* 196* 290* 208* 219* 210*   Leukocytosis: In the setting of Above. WBC went from 17.8 -> 16.3 and is now 12.4 the time of discharge. CTM for S/Sx of Infection. Repeat CBC in the AM.   HypoNatremia: Na+ went from 139 -> 134. Discontinue the Hydrochlorothiazide . Replete w/ IV Na Phos 20 mmol. CTM and Replete as Necessary. Repeat CMP in the AM  Hypophosphatemia: Phos Level was 2.4. Replete w/ IV Na Phos 20 mmol. CTM and Replete as Necessary. Repeat Phos Lvl in the AM  Hx of Superior Mesenteric Artery Thrombosis: Continue Apixaban  5 mg po BID   Hyperlipidemia: Stopped Atorvastatin  40 mg po daily given GI recommendations: Lipid panel was done and showed total cholesterol/HDL-C of 2.6, cholesterol 859, HDL 56, LDL 50, triglycerides 212, VLDL 42   Essential HTN: C/w Irbesartan  150 mg po Daily. D/C Hydrochlorothiazide . CTM BP per Protocol. Last BP reading was 157/97  Erythrocytosis: Mild. Hgb went from 17.7 -> 17.4 is now 15.9. CTM and Trend and repeat CBC in the AM   Hyperbilirubinemia: T Bili Trend went from 1.2 -> 1.4 and is now 1.1. CTM  and Trend and Repeat CMP in the AM  Class II Obesity: Complicates overall prognosis and care. Estimated body mass index is 37.6 kg/m as calculated from the following:   Height as of this encounter: 6' 1 (1.854 m).   Weight as of this encounter: 129.3 kg. Weight Loss and Dietary Counseling given

## 2024-01-30 DIAGNOSIS — E785 Hyperlipidemia, unspecified: Secondary | ICD-10-CM

## 2024-01-30 DIAGNOSIS — K859 Acute pancreatitis without necrosis or infection, unspecified: Secondary | ICD-10-CM | POA: Diagnosis not present

## 2024-01-30 DIAGNOSIS — K760 Fatty (change of) liver, not elsewhere classified: Secondary | ICD-10-CM

## 2024-01-30 LAB — COMPREHENSIVE METABOLIC PANEL WITH GFR
ALT: 27 U/L (ref 0–44)
AST: 20 U/L (ref 15–41)
Albumin: 3.9 g/dL (ref 3.5–5.0)
Alkaline Phosphatase: 62 U/L (ref 38–126)
Anion gap: 12 (ref 5–15)
BUN: 11 mg/dL (ref 6–20)
CO2: 27 mmol/L (ref 22–32)
Calcium: 9.7 mg/dL (ref 8.9–10.3)
Chloride: 93 mmol/L — ABNORMAL LOW (ref 98–111)
Creatinine, Ser: 0.75 mg/dL (ref 0.61–1.24)
GFR, Estimated: >60 mL/min
Glucose, Bld: 213 mg/dL — ABNORMAL HIGH (ref 70–99)
Potassium: 3.5 mmol/L (ref 3.5–5.1)
Sodium: 132 mmol/L — ABNORMAL LOW (ref 135–145)
Total Bilirubin: 1.1 mg/dL (ref 0.0–1.2)
Total Protein: 6.9 g/dL (ref 6.5–8.1)

## 2024-01-30 LAB — CBC WITH DIFFERENTIAL/PLATELET
Abs Immature Granulocytes: 0.05 K/uL (ref 0.00–0.07)
Basophils Absolute: 0.1 K/uL (ref 0.0–0.1)
Basophils Relative: 1 %
Eosinophils Absolute: 0.3 K/uL (ref 0.0–0.5)
Eosinophils Relative: 3 %
HCT: 45.4 % (ref 39.0–52.0)
Hemoglobin: 15.9 g/dL (ref 13.0–17.0)
Immature Granulocytes: 0 %
Lymphocytes Relative: 15 %
Lymphs Abs: 1.8 K/uL (ref 0.7–4.0)
MCH: 34.3 pg — ABNORMAL HIGH (ref 26.0–34.0)
MCHC: 35 g/dL (ref 30.0–36.0)
MCV: 98.1 fL (ref 80.0–100.0)
Monocytes Absolute: 1 K/uL (ref 0.1–1.0)
Monocytes Relative: 8 %
Neutro Abs: 9.1 K/uL — ABNORMAL HIGH (ref 1.7–7.7)
Neutrophils Relative %: 73 %
Platelets: 144 K/uL — ABNORMAL LOW (ref 150–400)
RBC: 4.63 MIL/uL (ref 4.22–5.81)
RDW: 12.2 % (ref 11.5–15.5)
WBC: 12.4 K/uL — ABNORMAL HIGH (ref 4.0–10.5)
nRBC: 0 % (ref 0.0–0.2)

## 2024-01-30 LAB — LIPID PANEL
Cholesterol: 148 mg/dL (ref 0–200)
HDL: 56 mg/dL
LDL Cholesterol: 50 mg/dL (ref 0–99)
Total CHOL/HDL Ratio: 2.6 ratio
Triglycerides: 212 mg/dL — ABNORMAL HIGH
VLDL: 42 mg/dL — ABNORMAL HIGH (ref 0–40)

## 2024-01-30 LAB — GLUCOSE, CAPILLARY
Glucose-Capillary: 219 mg/dL — ABNORMAL HIGH (ref 70–99)
Glucose-Capillary: 210 mg/dL — ABNORMAL HIGH (ref 70–99)

## 2024-01-30 LAB — PHOSPHORUS: Phosphorus: 2.5 mg/dL (ref 2.5–4.6)

## 2024-01-30 LAB — LIPASE, BLOOD: Lipase: 177 U/L — ABNORMAL HIGH (ref 11–51)

## 2024-01-30 LAB — MAGNESIUM: Magnesium: 2 mg/dL (ref 1.7–2.4)

## 2024-01-30 MED ORDER — IRBESARTAN 150 MG PO TABS
150.0000 mg | ORAL_TABLET | Freq: Every day | ORAL | 0 refills | Status: AC
  Filled 2024-01-30: qty 30, 30d supply, fill #0

## 2024-01-30 MED ORDER — OXYCODONE HCL 5 MG PO TABS
5.0000 mg | ORAL_TABLET | ORAL | 0 refills | Status: DC | PRN
  Filled 2024-01-30: qty 18, 3d supply, fill #0

## 2024-01-30 MED ORDER — ONDANSETRON 4 MG PO TBDP
4.0000 mg | ORAL_TABLET | Freq: Three times a day (TID) | ORAL | 0 refills | Status: AC | PRN
  Filled 2024-01-30: qty 20, 7d supply, fill #0

## 2024-01-30 MED ORDER — ACETAMINOPHEN 325 MG PO TABS
650.0000 mg | ORAL_TABLET | Freq: Four times a day (QID) | ORAL | 0 refills | Status: AC | PRN
  Filled 2024-01-30: qty 20, 3d supply, fill #0

## 2024-01-30 NOTE — Plan of Care (Signed)

## 2024-01-30 NOTE — Plan of Care (Signed)
 RN discussed with pt if they wanted to send medications (including narcotics) to a 24 hr pharmacy as [place] [facility] are closed today 12/25.  Patient understands this and wishes for medications to still be sent over to [facility] and pt will pick up another day.

## 2024-01-30 NOTE — Progress Notes (Signed)
"      ° °  Patient Name: Riley Wiggins Date of Encounter: 01/30/2024, 8:43 AM     Assessment and Plan   Acute and recurrent pancreatitis, third episode since 2021, imaging today has raise the possibility of an area of necrosis though he looks good and I doubt that.   Cause unclear, no evidence for biliary cause at this time and in past, does have history of hypertriglyceridemia but not greater than 1000, must consider medications notably he has a history of using Ozempic  and Trulicity  both of which can cause it though he was not on those during his first episode.  Also he has been on atorvastatin  during each of these episodes and it is a possible culprit.   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  -------------------------------------------------------------------------------------------------------------------------------------------------  He is significantly improved.  Abdomen is soft with slight epigastric and left upper quadrant tenderness.  He feels ready to be discharged.  I think he can be discharged to home if okay with hospitalist and I will arrange outpatient [facility] gastroenterology follow up and we will contact him about the appointment.  I would leave him off Trulicity  and atorvastatin .  Primary care can choose other treatment for diabetes and lipids.  Low-fat carb modified diet   Subjective  Feels ready to go tolerating solid food.   Objective  BP (!) 138/98   Pulse 99   Temp 97.9 F (36.6 C) (Oral)   Resp 16   Ht 6' 1 (1.854 m)   Wt 129.3 kg   SpO2 96%   BMI 37.60 kg/m  No acute distress Abdomen is obese soft and mildly tender in the epigastrium and left upper quadrant bowel sounds present  Recent Labs  Lab 01/28/24 0819 01/29/24 0435 01/30/24 0431  NA 139 134* 132*  K 2.9* 3.7 3.5  CL 101 95* 93*  CO2 25 26 27   GLUCOSE 232* 181* 213*  BUN 13 12 11   CREATININE 0.73 0.82 0.75  CALCIUM   9.1 10.0 9.7  MG  --  1.7 2.0  PHOS  --  2.4* 2.5   Lab Results  Component Value Date   LIPASE 177 (H) 01/30/2024    Recent Labs  Lab 01/28/24 0819 01/29/24 0435 01/30/24 0431  HGB 17.7* 17.4* 15.9  HCT 50.8 49.8 45.4  WBC 17.8* 16.3* 12.4*  PLT 182 155 144*    Lab Results  Component Value Date   CHOL 148 01/30/2024   HDL 56 01/30/2024   LDLCALC 50 01/30/2024   LDLDIRECT 57.0 08/03/2019   TRIG 212 (H) 01/30/2024   CHOLHDL 2.6 01/30/2024       Lupita CHARLENA Commander, MD, [facility] See TRACEY on call - gastroenterology for best contact person 01/30/2024 8:43 AM   "

## 2024-01-31 ENCOUNTER — Other Ambulatory Visit: Payer: Self-pay | Admitting: Nurse Practitioner

## 2024-01-31 ENCOUNTER — Encounter: Payer: Self-pay | Admitting: Nurse Practitioner

## 2024-01-31 ENCOUNTER — Other Ambulatory Visit: Payer: Self-pay

## 2024-01-31 ENCOUNTER — Telehealth: Payer: Self-pay | Admitting: *Deleted

## 2024-01-31 DIAGNOSIS — K859 Acute pancreatitis without necrosis or infection, unspecified: Secondary | ICD-10-CM

## 2024-01-31 DIAGNOSIS — K858 Other acute pancreatitis without necrosis or infection: Secondary | ICD-10-CM

## 2024-01-31 MED ORDER — TRAMADOL HCL 50 MG PO TABS
50.0000 mg | ORAL_TABLET | Freq: Three times a day (TID) | ORAL | 0 refills | Status: AC | PRN
  Filled 2024-01-31 – 2024-02-12 (×2): qty 21, 7d supply, fill #0

## 2024-01-31 NOTE — Transitions of Care (Post Inpatient/ED Visit) (Signed)
" ° °  01/31/2024  Name: Riley Wiggins MRN: [id number] DOB: [birth year]  Today's TOC FU Call Status: Today's TOC FU Call Status:: Unsuccessful Call (1st Attempt) Unsuccessful Call (1st Attempt) Date: 01/31/24  Attempted to reach the patient regarding the most recent Inpatient/ED visit.  Follow Up Plan: Additional outreach attempts will be made to reach the patient to complete the Transitions of Care (Post Inpatient/ED visit) call.   Andrea Dimes RN, BSN [place]  [facility] RN Care Manager [phone]  "

## 2024-02-01 NOTE — Discharge Summary (Signed)
 " Physician Discharge Summary   Patient: Riley Wiggins MRN: [id number] DOB: 01/25/1966  Admit date:     01/28/2024  Discharge date: 01/30/2024  Discharge Physician: Alejandro Marker, DO   PCP: Theotis Haze ORN, NP   Recommendations at discharge:   Follow-up with PCP within 1 to 2 weeks repeat CBC, CMP, mag, Phos within 1 week Follow-up with Gastroenterology in outpatient setting within 1 to 2 weeks  Discharge Diagnoses: Principal Problem:   Acute recurrent pancreatitis Active Problems:   Dyslipidemia   Metabolic dysfunction-associated fatty liver disease (MAFLD)  Resolved Problems:   * No resolved hospital problems. *  Hospital Course: Riley Wiggins is a 58 y.o. male with medical history significant for insulin -dependent type 2 diabetes, dyslipidemia, hypertension, prior idiopathic pancreatitis be admitted to the hospital with acute pancreatitis.  Patient states that starting a couple of days ago, he started having epigastric abdominal pain, without radiation.  There was associated nausea without vomiting, and diarrhea.  There was also some abdominal distention.  The symptoms are all reminiscent of when he had pancreatitis back in 2023.    Getting IVF and diet slowly advancing. GI consulted for further evaluation and recommendations.  He subsequently improved and GI recommended that even though there is no clear cause for his pancreatitis they are recommending holding Trulicity  and his atorvastatin  for now.  Recommended a low-fat carb modified diet and his pain was improved and did not have much tenderness.  He is medically stable for discharge only to follow-up with PCP and gastroenterology outpatient setting.  Assessment and Plan:  Acute Pancreatitis: without cause identified, no ductal dilatation or other abnormality on right upper quadrant ultrasound.  In the past, he has had idiopathic pancreatitis.  Triglycerides in July 219.   -CT with acute interstitial edematous pancreatitis,  possible 1.4 cm area of early pancreatic necrosis.  Will need repeat imaging for this. -Observation admission and now Admitted as Inpt.  -Was N.p.o. except for ice chips and sips with meds, will plan to advance diet slowly as pain subsides; Now advanced to CLD -Lipase Lvl went from 505 -> 230 -> 177 on last check -Aggressive IV fluid hydration; Was getting LR @ 200 mL/hr x1 Day and this timed out and will renew w/ LR @ 75 mL/hr x1 Day.  -Pain Control w/ po/RC Acetaminophen  650 mg po q6hprn Mild Pain/Fever, Oxycodone  5 mg po q4hprn Moderate Pain, and IV Hydromorphone  q2hoprn Severe Pain;   -C/w Nausea control w/ IV Ondansetron  4 mg q6hprn  -GI to be consulted for further evaluation and recommendations.    Insulin -Dependent Type 2 Diabetes Mellitus, Uncontrolled: Not well-controlled, last A1c 8.8;  Was NPO. as above, plan for carb modified diet when advanced and tolerating but on CLD for now. Continue basal insulin  at half dose at 15 units sq Daily. C/w Moderate Novolog  SSI q4h. CTM CBGs per Protocol. CBG Trend:  Recent Labs  Lab 01/29/24 0726 01/29/24 1112 01/29/24 1631 01/29/24 1944 01/29/24 2337 01/30/24 0358 01/30/24 0815  GLUCAP 187* 165* 196* 290* 208* 219* 210*   Leukocytosis: In the setting of Above. WBC went from 17.8 -> 16.3 and is now 12.4 the time of discharge. CTM for S/Sx of Infection. Repeat CBC in the AM.   HypoNatremia: Na+ went from 139 -> 134. Discontinue the Hydrochlorothiazide . Replete w/ IV Na Phos 20 mmol. CTM and Replete as Necessary. Repeat CMP in the AM  Hypophosphatemia: Phos Level was 2.4. Replete w/ IV Na Phos 20 mmol. CTM and Replete as Necessary.  Repeat Phos Lvl in the AM  Hx of Superior Mesenteric Artery Thrombosis: Continue Apixaban  5 mg po BID   Hyperlipidemia: Stopped Atorvastatin  40 mg po daily given GI recommendations: Lipid panel was done and showed total cholesterol/HDL-C of 2.6, cholesterol 859, HDL 56, LDL 50, triglycerides 212, VLDL 42    Essential HTN: C/w Irbesartan  150 mg po Daily. D/C Hydrochlorothiazide . CTM BP per Protocol. Last BP reading was 157/97  Erythrocytosis: Mild. Hgb went from 17.7 -> 17.4 is now 15.9. CTM and Trend and repeat CBC in the AM   Hyperbilirubinemia: T Bili Trend went from 1.2 -> 1.4 and is now 1.1. CTM and Trend and Repeat CMP in the AM  Class II Obesity: Complicates overall prognosis and care. Estimated body mass index is 37.6 kg/m as calculated from the following:   Height as of this encounter: 6' 1 (1.854 m).   Weight as of this encounter: 129.3 kg. Weight Loss and Dietary Counseling given  Consultants: Gastroenterology Procedures performed: As delineated as above   Disposition: Home Diet recommendation:  Discharge Diet Orders (From admission, onward)     Start     Ordered   01/30/24 0000  Diet - low sodium heart healthy        01/30/24 1108   01/30/24 0000  Diet Carb Modified       Comments: SOFT Diet   01/30/24 1108           Cardiac and Carb modified diet DISCHARGE MEDICATION: Allergies as of 01/30/2024   No Known Allergies      Medication List     PAUSE taking these medications    atorvastatin  40 MG tablet Wait to take this until your doctor or other care provider tells you to start again. Commonly known as: LIPITOR Take 1 tablet (40 mg total) by mouth daily.   Trulicity  0.75 MG/0.5ML Soaj Wait to take this until your doctor or other care provider tells you to start again. Generic drug: Dulaglutide  Inject 0.75 mg into the skin once a week. What changed: when to take this       STOP taking these medications    valsartan -hydrochlorothiazide  160-25 MG tablet Commonly known as: DIOVAN -HCT       TAKE these medications    Accu-Chek Guide Test test strip Generic drug: glucose blood Use to check blood sugar 3 times daily.   Accu-Chek Guide w/Device Kit Use to check blood sugar 3 times daily.   Accu-Chek Softclix Lancets lancets Use to check blood  sugar 3 times daily.   acetaminophen  325 MG tablet Commonly known as: TYLENOL  Take 2 tablets (650 mg total) by mouth every 6 (six) hours as needed for mild pain (pain score 1-3) or fever (or Fever >/= 101).   allopurinol  100 MG tablet Commonly known as: ZYLOPRIM  Take 1 tablet (100 mg total) by mouth daily.   Basaglar  KwikPen 100 UNIT/ML Inject 30 Units into the skin daily. What changed: when to take this   Eliquis  5 MG Tabs tablet Generic drug: apixaban  Take 1 tablet (5 mg total) by mouth 2 (two) times daily. What changed: when to take this   Farxiga  10 MG Tabs tablet Generic drug: dapagliflozin  propanediol Take 1 tablet (10 mg total) by mouth daily.   FreeStyle Libre 3 Sensor Misc Place 1 sensor on the skin every 14 days. Use to check glucose continuously.   FreeStyle Libre 3 Plus Sensor Misc Change sensor every 15 days.   irbesartan  150 MG tablet Commonly known as: AVAPRO   Take 1 tablet (150 mg total) by mouth daily.   Multi For Him 50+ Tabs Take 1 tablet by mouth daily with breakfast.   OMEGA 3 FISH OIL PO Take 1 capsule by mouth daily with breakfast.   ondansetron  4 MG disintegrating tablet Commonly known as: ZOFRAN -ODT Take 1 tablet (4 mg total) by mouth every 8 (eight) hours as needed for nausea or vomiting.   Repatha  SureClick 140 MG/ML Soaj Generic drug: Evolocumab  Inject 140 mg into the skin every 14 (fourteen) days.   sildenafil  100 MG tablet Commonly known as: Viagra  Take 0.5-1 tablets (50-100 mg total) by mouth daily as needed for erectile dysfunction.   TechLite Pen Needles 31G X 5 MM Misc Generic drug: Insulin  Pen Needle Use as instructed to inject insulin    Vitamin D  (Ergocalciferol ) 1.25 MG (50000 UNIT) Caps capsule Commonly known as: DRISDOL  Take 1 capsule (50,000 Units total) by mouth every 7 (seven) days. What changed: when to take this       Discharge Exam: Filed Weights   01/28/24 1413  Weight: 129.3 kg   Vitals:   01/29/24 1942  01/30/24 0402  BP: (!) 161/96 (!) 138/98  Pulse: (!) 106 99  Resp: 17 16  Temp: 98.8 F (37.1 C) 97.9 F (36.6 C)  SpO2: 96% 96%   Examination: Physical Exam:  Constitutional: WN/WD obese African-American male in no acute distress Respiratory: Diminished to auscultation bilaterally, no wheezing, rales, rhonchi or crackles. Normal respiratory effort and patient is not tachypenic. No accessory muscle use.  Unlabored breathing Cardiovascular: RRR, no murmurs / rubs / gallops. S1 and S2 auscultated.  Mild extremity edema Abdomen: Soft, slightly tender to palpation distended secondary habitus. Bowel sounds positive.  GU: Deferred. Musculoskeletal: No clubbing / cyanosis of digits/nails. No joint deformity upper and lower extremities.  Skin: No rashes, lesions, ulcers limited skin evaluation. No induration; Warm and dry.  Neurologic: CN 2-12 grossly intact with no focal deficits. Romberg sign and cerebellar reflexes not assessed.  Psychiatric: Normal judgment and insight. Alert and oriented x 3. Normal mood and appropriate affect.   Condition at discharge: stable  The results of significant diagnostics from this hospitalization (including imaging, microbiology, ancillary and laboratory) are listed below for reference.   Imaging Studies: US  Abdomen Limited RUQ (LIVER/GB) Result Date: 01/28/2024 EXAM: Right Upper Quadrant Abdominal Ultrasound 01/28/2024 10:06:00 AM TECHNIQUE: Real-time ultrasonography of the right upper quadrant of the abdomen was performed. COMPARISON: US  Abdomen 10/06/2021; 03/12/2020. CLINICAL HISTORY: RUQ pain. FINDINGS: LIVER: Mild hepatic steatosis. No intrahepatic biliary ductal dilatation. No evidence of mass. Hepatopetal flow in the portal vein. BILIARY SYSTEM: Gallbladder wall thickness measures 1.0 mm. No pericholecystic fluid. No cholelithiasis. No sonographic Murphy sign. The common bile duct measures 2.3 mm. OTHER: No right upper quadrant ascites. IMPRESSION: 1. No  acute cholecystolithiasis or changes of acute cholecystitis. Electronically signed by: Rogelia Myers MD 01/28/2024 11:21 AM EST RP Workstation: [id number]   CT ABDOMEN PELVIS W CONTRAST Result Date: 01/28/2024 EXAM: CT ABDOMEN AND PELVIS WITH CONTRAST 01/28/2024 10:33:24 AM TECHNIQUE: CT of the abdomen and pelvis was performed with the administration of 100 mL of iohexol  (OMNIPAQUE ) 300 MG/ML solution. Multiplanar reformatted images are provided for review. Automated exposure control, iterative reconstruction, and/or weight-based adjustment of the mA/kV was utilized to reduce the radiation dose to as low as reasonably achievable. COMPARISON: 10/06/2021 CLINICAL HISTORY: Pancreatitis, acute, severe. FINDINGS: LOWER CHEST: Posterior bibasilar dependent atelectasis. Right coronary artery atherosclerosis. Small volume symmetric bilateral gynecomastia. LIVER: Diffuse hepatic steatosis. GALLBLADDER AND  BILE DUCTS: Small amount of inflammatory stranding about the gallbladder neck is also likely related to pancreatitis. No gallbladder wall thickening or radiopaque stones. No biliary ductal dilatation. SPLEEN: No acute abnormality. PANCREAS: Acute peripancreatic edema and fluid surrounding the entire pancreatic parenchyma, extending into the root of the mesentery, the left anterior pararenal space, the gastrosplenic ligament, and the left paracolic gutter. The fluid around the pancreatic head extends into the pancreaticoduodenal groove, the right anterior pararenal space, and the right paracolic gutter. Small region of hypoattenuation of the pancreatic head measuring 1.4 cm axial is indeterminate, but likely interdigitating edema. Close follow up is recommended as early pancreatic necrosis could have this appearance. No well formed or drainable peripancreatic fluid collection. ADRENAL GLANDS: No acute abnormality. KIDNEYS, URETERS AND BLADDER: No stones in the kidneys or ureters. No hydronephrosis. No perinephric or  periureteral stranding. Urinary bladder is unremarkable. GI AND BOWEL: Stomach demonstrates no acute abnormality. There is no bowel obstruction. Total colonic diverticulosis. No changes of acute diverticulitis. Gas filled noninflamed appendix. PERITONEUM AND RETROPERITONEUM: No ascites. No free air. VASCULATURE: Aorta is normal in caliber. Diffuse aortoiliac atherosclerosis. LYMPH NODES: No lymphadenopathy. REPRODUCTIVE ORGANS: No prostatomegaly. BONES AND SOFT TISSUES: Multilevel degenerative disc disease of the thoracolumbar spine. Mild bilateral hip osteoarthritis with subchondral cystic change. No acute osseous abnormality. No focal soft tissue abnormality. IMPRESSION: 1. Findings consistent with acute interstitial edematous pancreatitis. Indeterminate 1.4 cm region of hypoattenuation in the pancreatic head, which may represent interdigitating edema. Alternatively, early pancreatic necrosis could have this appearance and close cross-sectional imaging follow-up is recommended.No well-formed or drainable peripancreatic fluid collection. 2. Total colonic diverticulosis. 3. Diffuse hepatic steatosis. Electronically signed by: Rogelia Myers MD 01/28/2024 11:18 AM EST RP Workstation: [id number]   DG Chest Portable 1 View Result Date: 01/28/2024 CLINICAL DATA:  Cough. EXAM: PORTABLE CHEST 1 VIEW COMPARISON:  None Available. FINDINGS: The heart size and mediastinal contours are within normal limits. Both lungs are clear. The visualized skeletal structures are unremarkable. IMPRESSION: No active disease. Electronically Signed   By: Norleen DELENA Kil M.D.   On: 01/28/2024 10:21    Microbiology: Results for orders placed or performed during the hospital encounter of 01/28/24  Resp panel by RT-PCR (RSV, Flu A&B, Covid) Anterior Nasal Swab     Status: None   Collection Time: 01/28/24  8:19 AM   Specimen: Anterior Nasal Swab  Result Value Ref Range Status   SARS Coronavirus 2 by RT PCR NEGATIVE NEGATIVE Final     Comment: (NOTE) SARS-CoV-2 target nucleic acids are NOT DETECTED.  The SARS-CoV-2 RNA is generally detectable in upper respiratory specimens during the acute phase of infection. The lowest concentration of SARS-CoV-2 viral copies this assay can detect is 138 copies/mL. A negative result does not preclude SARS-Cov-2 infection and should not be used as the sole basis for treatment or other patient management decisions. A negative result may occur with  improper specimen collection/handling, submission of specimen other than nasopharyngeal swab, presence of viral mutation(s) within the areas targeted by this assay, and inadequate number of viral copies(<138 copies/mL). A negative result must be combined with clinical observations, patient history, and epidemiological information. The expected result is Negative.  Fact Sheet for Patients:  bloggercourse.com  Fact Sheet for Healthcare Providers:  seriousbroker.it  This test is no t yet approved or cleared by the [place]  FDA and  has been authorized for detection and/or diagnosis of SARS-CoV-2 by FDA under an Emergency Use Authorization (EUA). This EUA will remain  in  effect (meaning this test can be used) for the duration of the COVID-19 declaration under Section 564(b)(1) of the Act, 21 U.S.C.section 360bbb-3(b)(1), unless the authorization is terminated  or revoked sooner.       Influenza A by PCR NEGATIVE NEGATIVE Final   Influenza B by PCR NEGATIVE NEGATIVE Final    Comment: (NOTE) The Xpert Xpress SARS-CoV-2/FLU/RSV plus assay is intended as an aid in the diagnosis of influenza from Nasopharyngeal swab specimens and should not be used as a sole basis for treatment. Nasal washings and aspirates are unacceptable for Xpert Xpress SARS-CoV-2/FLU/RSV testing.  Fact Sheet for Patients: bloggercourse.com  Fact Sheet for Healthcare  Providers: seriousbroker.it  This test is not yet approved or cleared by the [place]  FDA and has been authorized for detection and/or diagnosis of SARS-CoV-2 by FDA under an Emergency Use Authorization (EUA). This EUA will remain in effect (meaning this test can be used) for the duration of the COVID-19 declaration under Section 564(b)(1) of the Act, 21 U.S.C. section 360bbb-3(b)(1), unless the authorization is terminated or revoked.     Resp Syncytial Virus by PCR NEGATIVE NEGATIVE Final    Comment: (NOTE) Fact Sheet for Patients: bloggercourse.com  Fact Sheet for Healthcare Providers: seriousbroker.it  This test is not yet approved or cleared by the [place]  FDA and has been authorized for detection and/or diagnosis of SARS-CoV-2 by FDA under an Emergency Use Authorization (EUA). This EUA will remain in effect (meaning this test can be used) for the duration of the COVID-19 declaration under Section 564(b)(1) of the Act, 21 U.S.C. section 360bbb-3(b)(1), unless the authorization is terminated or revoked.  Performed at [facility], [address] [place], [place] [address]    Labs: CBC: Recent Labs  Lab 01/28/24 [phone] 01/29/24 0435 01/30/24 0431  WBC 17.8* 16.3* 12.4*  NEUTROABS 15.6*  --  9.1*  HGB 17.7* 17.4* 15.9  HCT 50.8 49.8 45.4  MCV 98.8 99.4 98.1  PLT 182 155 144*   Basic Metabolic Panel: Recent Labs  Lab 01/28/24 0819 01/29/24 0435 01/30/24 0431  NA 139 134* 132*  K 2.9* 3.7 3.5  CL 101 95* 93*  CO2 25 26 27   GLUCOSE 232* 181* 213*  BUN 13 12 11   CREATININE 0.73 0.82 0.75  CALCIUM  9.1 10.0 9.7  MG  --  1.7 2.0  PHOS  --  2.4* 2.5   Liver Function Tests: Recent Labs  Lab 01/28/24 0819 01/29/24 0435 01/30/24 0431  AST 27 26 20   ALT 48* 36 27  ALKPHOS 63 65 62  BILITOT 1.2 1.4* 1.1  PROT 6.5 7.2 6.9  ALBUMIN 3.9 4.0 3.9    CBG: Recent Labs  Lab 01/29/24 1631 01/29/24 1944 01/29/24 2337 01/30/24 0358 01/30/24 0815  GLUCAP 196* 290* 208* 219* 210*   Discharge time spent: greater than 30 minutes.  Signed: Alejandro Marker, DO [facility] 02/01/2024 "

## 2024-02-03 ENCOUNTER — Telehealth: Payer: Self-pay | Admitting: *Deleted

## 2024-02-03 NOTE — Transitions of Care (Post Inpatient/ED Visit) (Signed)
" ° °  02/03/2024  Name: Riley Wiggins MRN: [id number] DOB: 29-Aug-1965  Today's TOC FU Call Status: Today's TOC FU Call Status:: Unsuccessful Call (2nd Attempt) Unsuccessful Call (2nd Attempt) Date: 02/03/24  Attempted to reach the patient regarding the most recent Inpatient/ED visit.  Follow Up Plan: Additional outreach attempts will be made to reach the patient to complete the Transitions of Care (Post Inpatient/ED visit) call.   Andrea Dimes RN, BSN [place]  [facility] RN Care Manager [phone]  "

## 2024-02-04 ENCOUNTER — Telehealth: Payer: Self-pay | Admitting: *Deleted

## 2024-02-04 NOTE — Transitions of Care (Post Inpatient/ED Visit) (Signed)
" ° °  02/04/2024  Name: Riley Wiggins MRN: [id number] DOB: April 15, 1965  Today's TOC FU Call Status: Today's TOC FU Call Status:: Unsuccessful Call (3rd Attempt) Unsuccessful Call (3rd Attempt) Date: 02/04/24  Attempted to reach the patient regarding the most recent Inpatient/ED visit.  Follow Up Plan: No further outreach attempts will be made at this time. We have been unable to contact the patient.  Andrea Dimes RN, BSN [place]  [facility] RN Care Manager [phone]  "

## 2024-02-05 ENCOUNTER — Other Ambulatory Visit: Payer: Self-pay

## 2024-02-05 ENCOUNTER — Other Ambulatory Visit: Payer: Self-pay | Admitting: Nurse Practitioner

## 2024-02-05 ENCOUNTER — Other Ambulatory Visit: Payer: Self-pay | Admitting: Family Medicine

## 2024-02-05 DIAGNOSIS — E559 Vitamin D deficiency, unspecified: Secondary | ICD-10-CM

## 2024-02-05 MED ORDER — ACCU-CHEK GUIDE W/DEVICE KIT
PACK | 0 refills | Status: AC
  Filled 2024-02-05: qty 1, 30d supply, fill #0

## 2024-02-11 ENCOUNTER — Other Ambulatory Visit: Payer: Self-pay

## 2024-02-12 ENCOUNTER — Other Ambulatory Visit: Payer: Self-pay

## 2024-02-13 ENCOUNTER — Other Ambulatory Visit: Payer: Self-pay

## 2024-02-17 ENCOUNTER — Other Ambulatory Visit: Payer: Self-pay

## 2024-02-24 ENCOUNTER — Other Ambulatory Visit: Payer: Self-pay | Admitting: Nurse Practitioner

## 2024-02-24 ENCOUNTER — Other Ambulatory Visit: Payer: Self-pay

## 2024-02-24 ENCOUNTER — Encounter: Payer: Self-pay | Admitting: Nurse Practitioner

## 2024-02-24 DIAGNOSIS — I10 Essential (primary) hypertension: Secondary | ICD-10-CM

## 2024-02-24 NOTE — Telephone Encounter (Signed)
 Noted in chart from visit.

## 2024-03-03 NOTE — Progress Notes (Unsigned)
 "  Chief Complaint: Hospital follow-up Primary GI MD: Dr. Leigh  HPI: Discussed the use of AI scribe software for clinical note transcription with the patient, who gave verbal consent to proceed.  History of Present Illness   Riley Wiggins is a 59 year old male with recurrent pancreatitis and hepatic steatosis who presents for follow-up after hospitalization for pancreatitis in December 2025.  Hospitalized in December 2025 for recurrent pancreatitis, with a prior episode in 2023. He reports that he does not consume significant alcohol. His prior workup showed normal triglyceride levels and no gallstones. Trulicity  and rosuvastatin were previously held during admission for suspected source of pancreatitis.   lipase 505 and CT with acute interstitial edematous pancreatitis possible 1.4 cm area of early pancreatic necrosis.  No clear etiology of pancreatitis though Trulicity  and atorvastatin  were held during hospitalization.  No previous MRCP  Currently asymptomatic, with no gastrointestinal complaints or abdominal pain. Hepatic steatosis is previously identified, and he is not aware of any symptoms related to this condition.  No known family history of pancreatitis. Colonoscopy is not due until 2030.   PREVIOUS GI WORKUP    Colonoscopy 08/2021 for heme positive stool - The examined portion of the ileum was normal.  - One diminutive polyp (tubular adenoma) in the cecum, removed with a cold snare. Resected and retrieved.  - Diverticulosis in the entire examined colon.  - Internal hemorrhoids.  - The examination was otherwise normal. 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     Past Surgical History:  Procedure Laterality Date   COLONOSCOPY  2023   HERNIA REPAIR      when I was young     Current Outpatient Medications  Medication Sig Dispense Refill   Accu-Chek Softclix Lancets lancets Use to check blood sugar 3 times daily. 100 each 6   acetaminophen  (TYLENOL ) 325 MG tablet Take 2 tablets (650 mg total) by mouth every 6 (six) hours as needed for mild pain (pain score 1-3) or fever (or Fever >/= 101). 20 tablet 0   allopurinol  (ZYLOPRIM ) 100 MG tablet Take 1 tablet (100 mg total) by mouth daily. 90 tablet 1   apixaban  (ELIQUIS ) 5 MG TABS tablet Take 1 tablet (5 mg total) by mouth 2 (two) times daily. (Patient taking differently: Take 5 mg by mouth daily at 6 (six) AM.) 180 tablet 2   Blood Glucose Monitoring Suppl (ACCU-CHEK GUIDE) w/Device KIT Use to check blood sugar 3 times daily. 1 kit 0   Continuous Glucose Sensor (FREESTYLE LIBRE 3 PLUS SENSOR) MISC Change sensor every 15 days. 2 each 6   Continuous Glucose Sensor (FREESTYLE LIBRE 3 SENSOR) MISC Place 1 sensor on the skin every 14 days. Use to check glucose continuously. 2 each 3   dapagliflozin  propanediol (FARXIGA ) 10 MG TABS tablet Take 1 tablet (10 mg total) by mouth daily. 90 tablet 3   Evolocumab  (REPATHA  SURECLICK) 140 MG/ML SOAJ Inject 140 mg into the skin every 14 (fourteen) days. 6 mL 3   glucose blood (ACCU-CHEK GUIDE TEST) test strip Use to check blood sugar 3 times daily. 100 each  6   Insulin  Glargine (BASAGLAR  KWIKPEN) 100 UNIT/ML Inject 30 Units into the skin daily. (Patient taking differently: Inject 30 Units into the skin in the morning.) 15 mL PRN   Insulin  Pen Needle (TECHLITE PEN NEEDLES) 31G X 5 MM MISC Use as instructed to inject insulin  100 each 0   irbesartan  (AVAPRO ) 150 MG tablet Take 1 tablet (150 mg total) by mouth daily. 30 tablet 0   Multiple Vitamins-Minerals (MULTI FOR HIM 50+) TABS Take 1 tablet by mouth daily with breakfast.      Omega-3 Fatty Acids (OMEGA 3 FISH OIL PO) Take 1 capsule by mouth daily with breakfast.     ondansetron  (ZOFRAN -ODT) 4 MG disintegrating tablet Take 1 tablet (4 mg total) by mouth every 8 (eight) hours as needed for nausea or vomiting. 20 tablet 0   sildenafil  (VIAGRA ) 100 MG tablet Take 0.5-1 tablets (50-100 mg total) by mouth daily as needed for erectile dysfunction. 5 tablet 11   [Paused] atorvastatin  (LIPITOR) 40 MG tablet Take 1 tablet (40 mg total) by mouth daily. (Patient not taking: Reported on 03/04/2024) 90 tablet 3   [Paused] Dulaglutide  (TRULICITY ) 0.75 MG/0.5ML SOAJ Inject 0.75 mg into the skin once a week. (Patient not taking: Reported on 03/04/2024) 6 mL 0   No current facility-administered medications for this visit.    Allergies as of 03/04/2024   (No Known Allergies)    Family History  Problem Relation Age of Onset   Diabetes Mother    Diabetes Father    Pancreatitis Neg Hx    Colon cancer Neg Hx    Stomach cancer Neg Hx     Social History   Socioeconomic History   Marital status: Single    Spouse name: Not on file   Number of children: Not on file   Years of education: Not on file   Highest education level: 12th grade  Occupational History   Not on file  Tobacco Use   Smoking status: Former    Current packs/day: 0.00    Types: Cigarettes    Start date: 47    Quit date: 2009    Years since quitting: 17.0   Smokeless tobacco: Never   Tobacco comments:    Quit smoking 5 years ago  Vaping Use   Vaping status: Never Used  Substance and Sexual Activity   Alcohol use: Yes    Comment: rare   Drug use: No   Sexual activity: Yes  Other Topics Concern   Not on file  Social History Narrative   Truck driver   Lives in [place] with girlfriend she has 2 children he has 2 children   Rare alcohol   Former smoker no drug use   Social Drivers of Health   Tobacco Use: Medium Risk (03/04/2024)   Patient History    Smoking Tobacco Use: Former     Smokeless Tobacco Use: Never    Passive Exposure: Not on file  Financial Resource Strain: Low Risk (07/11/2023)   Overall Financial Resource Strain (CARDIA)    Difficulty of Paying Living Expenses: Not hard at all  Food Insecurity: No Food Insecurity (01/28/2024)   Epic    Worried About Radiation Protection Practitioner of Food in the Last Year: Never true    Ran Out of Food in the Last Year: Never true  Transportation Needs: No Transportation Needs (01/28/2024)   Epic    Lack of Transportation (Medical): No    Lack of Transportation (Non-Medical): No  Physical Activity: Sufficiently Active (  07/11/2023)   Exercise Vital Sign    Days of Exercise per Week: 7 days    Minutes of Exercise per Session: 30 min  Stress: No Stress Concern Present (07/11/2023)   [employer] of Occupational Health - Occupational Stress Questionnaire    Feeling of Stress : Not at all  Social Connections: Moderately Isolated (07/11/2023)   Social Connection and Isolation Panel    Frequency of Communication with Friends and Family: Three times a week    Frequency of Social Gatherings with Friends and Family: Patient declined    Attends Religious Services: 1 to 4 times per year    Active Member of Clubs or Organizations: No    Attends Engineer, Structural: Not on file    Marital Status: Divorced  Intimate Partner Violence: Not At Risk (01/28/2024)   Epic    Fear of Current or Ex-Partner: No    Emotionally Abused: No    Physically Abused: No    Sexually Abused: No  Depression (PHQ2-9): Low Risk (11/29/2023)   Depression (PHQ2-9)    PHQ-2 Score: 0  Alcohol Screen: Low Risk (07/11/2023)   Alcohol Screen    Last Alcohol Screening Score (AUDIT): 1  Housing: Low Risk (01/28/2024)   Epic    Unable to Pay for Housing in the Last Year: No    Number of Times Moved in the Last Year: 0    Homeless in the Last Year: No  Utilities: Not At Risk (01/28/2024)   Epic    Threatened with loss of utilities: No  Health Literacy: Adequate  Health Literacy (12/11/2022)   B1300 Health Literacy    Frequency of need for help with medical instructions: Never    Review of Systems:    Constitutional: No weight loss, fever, chills, weakness or fatigue HEENT: Eyes: No change in vision               Ears, Nose, Throat:  No change in hearing or congestion Skin: No rash or itching Cardiovascular: No chest pain, chest pressure or palpitations   Respiratory: No SOB or cough Gastrointestinal: See HPI and otherwise negative Genitourinary: No dysuria or change in urinary frequency Neurological: No headache, dizziness or syncope Musculoskeletal: No new muscle or joint pain Hematologic: No bleeding or bruising Psychiatric: No history of depression or anxiety    Physical Exam:  Vital signs: BP (!) 130/90 (BP Location: Left Arm, Patient Position: Sitting, Cuff Size: Large)   Pulse 88   Ht 5' 11.5 (1.816 m) Comment: height measured without shoes  Wt 261 lb 6 oz (118.6 kg)   BMI 35.95 kg/m   Constitutional: NAD, alert and cooperative Head:  Normocephalic and atraumatic. Eyes:   PEERL, EOMI. No icterus. Conjunctiva pink. Respiratory: Respirations even and unlabored. Lungs clear to auscultation bilaterally.   No wheezes, crackles, or rhonchi.  Cardiovascular:  Regular rate and rhythm. No peripheral edema, cyanosis or pallor.  Gastrointestinal:  Soft, nondistended, nontender. No rebound or guarding. Normal bowel sounds. No appreciable masses or hepatomegaly. Rectal:  Declines Msk:  Symmetrical without gross deformities. Without edema, no deformity or joint abnormality.  Neurologic:  Alert and  oriented x4;  grossly normal neurologically.  Skin:   Dry and intact without significant lesions or rashes. Psychiatric: Oriented to person, place and time. Demonstrates good judgement and reason without abnormal affect or behaviors.  Physical Exam    RELEVANT LABS AND IMAGING: CBC    Component Value Date/Time   WBC 12.4 (H) 01/30/2024  0431  RBC 4.63 01/30/2024 0431   HGB 15.9 01/30/2024 0431   HGB 17.4 03/18/2023 1235   HCT 45.4 01/30/2024 0431   HCT 50.5 03/18/2023 1235   PLT 144 (L) 01/30/2024 0431   PLT 189 03/18/2023 1235   MCV 98.1 01/30/2024 0431   MCV 98 (H) 03/18/2023 1235   MCV 101 (H) 05/10/2014 1321   MCH 34.3 (H) 01/30/2024 0431   MCHC 35.0 01/30/2024 0431   RDW 12.2 01/30/2024 0431   RDW 12.3 03/18/2023 1235   RDW 12.9 05/10/2014 1321   LYMPHSABS 1.8 01/30/2024 0431   LYMPHSABS 4.0 (H) 12/11/2022 1600   LYMPHSABS 2.3 05/10/2014 1321   MONOABS 1.0 01/30/2024 0431   MONOABS 1.2 (H) 05/10/2014 1321   EOSABS 0.3 01/30/2024 0431   EOSABS 0.2 12/11/2022 1600   EOSABS 0.3 05/10/2014 1321   BASOSABS 0.1 01/30/2024 0431   BASOSABS 0.1 12/11/2022 1600   BASOSABS 0 05/10/2014 1444   BASOSABS 0.1 05/10/2014 1321    CMP     Component Value Date/Time   NA 132 (L) 01/30/2024 0431   NA 137 11/29/2023 0923   NA 136 05/10/2014 1321   K 3.5 01/30/2024 0431   K 3.8 05/10/2014 1321   CL 93 (L) 01/30/2024 0431   CL 96 (L) 05/10/2014 1321   CO2 27 01/30/2024 0431   CO2 29 05/10/2014 1321   GLUCOSE 213 (H) 01/30/2024 0431   GLUCOSE 145 (H) 05/10/2014 1321   BUN 11 01/30/2024 0431   BUN 25 (H) 11/29/2023 0923   BUN 13 05/10/2014 1321   CREATININE 0.75 01/30/2024 0431   CREATININE 0.76 05/10/2014 1321   CALCIUM  9.7 01/30/2024 0431   CALCIUM  10.3 05/10/2014 1321   PROT 6.9 01/30/2024 0431   PROT 7.7 11/29/2023 0923   ALBUMIN 3.9 01/30/2024 0431   ALBUMIN 5.0 (H) 11/29/2023 0923   AST 20 01/30/2024 0431   ALT 27 01/30/2024 0431   ALKPHOS 62 01/30/2024 0431   BILITOT 1.1 01/30/2024 0431   BILITOT 1.0 11/29/2023 0923   GFRNONAA >60 01/30/2024 0431   GFRNONAA >60 05/10/2014 1321   GFRAA 110 03/17/2020 0950   GFRAA >60 05/10/2014 1321     Assessment/Plan:   59 year old male history of insulin -dependent type 2 diabetes, dyslipidemia, hypertension, prior idiopathic pancreatitis, presents for  hospital follow-up  Idiopathic pancreatitis Pancreatitis 2023.  Recent admission 01/2024 with lipase 505 and CT with acute interstitial edematous pancreatitis possible 1.4 cm area of early pancreatic necrosis.  No clear etiology of pancreatitis though Trulicity  and atorvastatin  were held during hospitalization.  No previous MRCP.  - MRCP for further evaluation - Pending MRCP may need to consider EUS in the future if continued recurrent episodes of pancreatitis -- decision to restart Trulicity  and atorvastatin  up to PCP, next visit is in 2 days. 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  Normal LFTs.  Imaging with diffuse hepatic steatosis. FIB 4 score 1.55 indicating further investigation. 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

## 2024-03-04 ENCOUNTER — Ambulatory Visit: Admitting: Gastroenterology

## 2024-03-04 ENCOUNTER — Encounter: Payer: Self-pay | Admitting: Gastroenterology

## 2024-03-04 ENCOUNTER — Other Ambulatory Visit (INDEPENDENT_AMBULATORY_CARE_PROVIDER_SITE_OTHER)

## 2024-03-04 VITALS — BP 130/90 | HR 88 | Ht 71.5 in | Wt 261.4 lb

## 2024-03-04 DIAGNOSIS — K8500 Idiopathic acute pancreatitis without necrosis or infection: Secondary | ICD-10-CM | POA: Diagnosis not present

## 2024-03-04 DIAGNOSIS — K8501 Idiopathic acute pancreatitis with uninfected necrosis: Secondary | ICD-10-CM

## 2024-03-04 DIAGNOSIS — Z860101 Personal history of adenomatous and serrated colon polyps: Secondary | ICD-10-CM

## 2024-03-04 DIAGNOSIS — Z860100 Personal history of colon polyps, unspecified: Secondary | ICD-10-CM

## 2024-03-04 DIAGNOSIS — K859 Acute pancreatitis without necrosis or infection, unspecified: Secondary | ICD-10-CM

## 2024-03-04 DIAGNOSIS — K760 Fatty (change of) liver, not elsewhere classified: Secondary | ICD-10-CM

## 2024-03-04 DIAGNOSIS — K55069 Acute infarction of intestine, part and extent unspecified: Secondary | ICD-10-CM

## 2024-03-04 DIAGNOSIS — E1110 Type 2 diabetes mellitus with ketoacidosis without coma: Secondary | ICD-10-CM

## 2024-03-04 LAB — CBC WITH DIFFERENTIAL/PLATELET
Basophils Absolute: 0.1 10*3/uL (ref 0.0–0.1)
Basophils Relative: 0.8 % (ref 0.0–3.0)
Eosinophils Absolute: 0.1 10*3/uL (ref 0.0–0.7)
Eosinophils Relative: 1.4 % (ref 0.0–5.0)
HCT: 43.7 % (ref 39.0–52.0)
Hemoglobin: 14.9 g/dL (ref 13.0–17.0)
Lymphocytes Relative: 33.5 % (ref 12.0–46.0)
Lymphs Abs: 2.2 10*3/uL (ref 0.7–4.0)
MCHC: 34.1 g/dL (ref 30.0–36.0)
MCV: 100.6 fl — ABNORMAL HIGH (ref 78.0–100.0)
Monocytes Absolute: 0.5 10*3/uL (ref 0.1–1.0)
Monocytes Relative: 7.8 % (ref 3.0–12.0)
Neutro Abs: 3.7 10*3/uL (ref 1.4–7.7)
Neutrophils Relative %: 56.5 % (ref 43.0–77.0)
Platelets: 188 10*3/uL (ref 150.0–400.0)
RBC: 4.34 Mil/uL (ref 4.22–5.81)
RDW: 13.1 % (ref 11.5–15.5)
WBC: 6.5 10*3/uL (ref 4.0–10.5)

## 2024-03-04 LAB — COMPREHENSIVE METABOLIC PANEL WITH GFR
ALT: 14 U/L (ref 3–53)
AST: 17 U/L (ref 5–37)
Albumin: 4.5 g/dL (ref 3.5–5.2)
Alkaline Phosphatase: 66 U/L (ref 39–117)
BUN: 14 mg/dL (ref 6–23)
CO2: 25 meq/L (ref 19–32)
Calcium: 9.8 mg/dL (ref 8.4–10.5)
Chloride: 103 meq/L (ref 96–112)
Creatinine, Ser: 0.86 mg/dL (ref 0.40–1.50)
GFR: 95.15 mL/min
Glucose, Bld: 169 mg/dL — ABNORMAL HIGH (ref 70–99)
Potassium: 4 meq/L (ref 3.5–5.1)
Sodium: 140 meq/L (ref 135–145)
Total Bilirubin: 0.5 mg/dL (ref 0.2–1.2)
Total Protein: 7.6 g/dL (ref 6.0–8.3)

## 2024-03-04 LAB — PROTIME-INR
INR: 1 ratio (ref 0.8–1.0)
Prothrombin Time: 11.7 s (ref 9.6–13.1)

## 2024-03-04 NOTE — Progress Notes (Signed)
 Agree with assessment / plan as outlined.

## 2024-03-04 NOTE — Patient Instructions (Addendum)
 Your provider has requested that you go to the basement level for lab work before leaving today. Press B on the elevator. The lab is located at the first door on the left as you exit the elevator.  You will be contacted by [facility] Scheduling in the next 2 days to arrange a Abdomen Ultrasound and MRI/MRCP.  The number on your caller ID will be [phone], please answer when they call.  If you have not heard from them in 2 days please call [phone] to schedule.       Due to recent changes in healthcare laws, you may see the results of your imaging and laboratory studies on MyChart before your provider has had a chance to review them.  We understand that in some cases there may be results that are confusing or concerning to you. Not all laboratory results come back in the same time frame and the provider may be waiting for multiple results in order to interpret others.  Please give us  48 hours in order for your provider to thoroughly review all the results before contacting the office for clarification of your results.   _______________________________________  If your blood pressure at your visit was 140/90 or greater, please contact your primary care physician to follow up on this.  _______________________________________________________  If you are age 59 or older, your body mass index should be between 23-30. Your Body mass index is 35.95 kg/m. If this is out of the aforementioned range listed, please consider follow up with your Primary Care Provider.  If you are age 59 or younger, your body mass index should be between 19-25. Your Body mass index is 35.95 kg/m. If this is out of the aformentioned range listed, please consider follow up with your Primary Care Provider.   ________________________________________________________  The [facility] providers would like to encourage you to use MYCHART to communicate with providers for non-urgent requests or questions.  Due to  long hold times on the telephone, sending your provider a message by [facility] may be a faster and more efficient way to get a response.  Please allow 48 business hours for a response.  Please remember that this is for non-urgent requests.  _______________________________________________________  [facility] is using a team-based approach to care.  Your team is made up of your doctor and two to three APPS. Our APPS (Nurse Practitioners and Physician Assistants) work with your physician to ensure care continuity for you. They are fully qualified to address your health concerns and develop a treatment plan. They communicate directly with your gastroenterologist to care for you. Seeing the Advanced Practice Practitioners on your physician's team can help you by facilitating care more promptly, often allowing for earlier appointments, access to diagnostic testing, procedures, and other specialty referrals.   Thank you for choosing me and [facility].

## 2024-03-06 ENCOUNTER — Ambulatory Visit: Admitting: Nurse Practitioner

## 2024-03-10 LAB — HEPATITIS C ANTIBODY: Hepatitis C Ab: NONREACTIVE

## 2024-03-10 LAB — HEPATITIS B SURFACE ANTIGEN: Hepatitis B Surface Ag: NONREACTIVE

## 2024-03-10 LAB — HEPATITIS B SURFACE ANTIBODY,QUALITATIVE: Hep B S Ab: NONREACTIVE

## 2024-03-10 LAB — ANA: Anti Nuclear Antibody (ANA): NEGATIVE

## 2024-03-10 LAB — MITOCHONDRIAL ANTIBODIES: Mitochondrial M2 Ab, IgG: <=20 U

## 2024-03-10 LAB — ANTI-SMOOTH MUSCLE ANTIBODY, IGG: Actin (Smooth Muscle) Antibody (IGG): <20 U

## 2024-03-10 LAB — ALPHA-1-ANTITRYPSIN: A-1 Antitrypsin, Ser: 110 mg/dL (ref 83–199)

## 2024-03-10 LAB — CERULOPLASMIN: Ceruloplasmin: 28 mg/dL (ref 14–30)

## 2024-03-10 LAB — IGG: IgG (Immunoglobin G), Serum: 879 mg/dL (ref 600–1640)

## 2024-03-11 ENCOUNTER — Ambulatory Visit: Payer: Self-pay | Admitting: Gastroenterology

## 2024-03-21 ENCOUNTER — Ambulatory Visit (HOSPITAL_COMMUNITY): Payer: Self-pay

## 2024-03-24 ENCOUNTER — Ambulatory Visit: Admitting: Nurse Practitioner
